# Patient Record
Sex: Female | Born: 1955 | Race: White | Hispanic: No | Smoking: Current every day smoker
Health system: Southern US, Community
[De-identification: ages and names within clinical notes are randomized; demographics above are authoritative.]

## PROBLEM LIST (undated history)

## (undated) DIAGNOSIS — F32A Depression, unspecified: Secondary | ICD-10-CM

## (undated) DIAGNOSIS — E785 Hyperlipidemia, unspecified: Secondary | ICD-10-CM

## (undated) DIAGNOSIS — R112 Nausea with vomiting, unspecified: Secondary | ICD-10-CM

## (undated) DIAGNOSIS — A0472 Enterocolitis due to Clostridium difficile, not specified as recurrent: Secondary | ICD-10-CM

## (undated) DIAGNOSIS — C801 Malignant (primary) neoplasm, unspecified: Secondary | ICD-10-CM

## (undated) DIAGNOSIS — IMO0002 Reserved for concepts with insufficient information to code with codable children: Secondary | ICD-10-CM

## (undated) DIAGNOSIS — F329 Major depressive disorder, single episode, unspecified: Secondary | ICD-10-CM

## (undated) DIAGNOSIS — Z9889 Other specified postprocedural states: Secondary | ICD-10-CM

## (undated) DIAGNOSIS — I2699 Other pulmonary embolism without acute cor pulmonale: Secondary | ICD-10-CM

## (undated) DIAGNOSIS — E038 Other specified hypothyroidism: Secondary | ICD-10-CM

## (undated) HISTORY — PX: LAMINECTOMY: SHX219

## (undated) HISTORY — PX: ABDOMINAL HYSTERECTOMY: SHX81

---

## 2012-08-29 ENCOUNTER — Encounter: Payer: Self-pay | Admitting: Physical Medicine & Rehabilitation

## 2012-10-04 ENCOUNTER — Ambulatory Visit: Payer: Medicare Other | Admitting: Physical Medicine & Rehabilitation

## 2013-06-03 ENCOUNTER — Ambulatory Visit: Payer: Medicare Other | Attending: Internal Medicine

## 2013-06-03 DIAGNOSIS — IMO0001 Reserved for inherently not codable concepts without codable children: Secondary | ICD-10-CM | POA: Insufficient documentation

## 2013-06-03 DIAGNOSIS — M2550 Pain in unspecified joint: Secondary | ICD-10-CM | POA: Insufficient documentation

## 2013-06-10 ENCOUNTER — Ambulatory Visit: Payer: Medicare Other | Admitting: Physical Therapy

## 2013-06-12 ENCOUNTER — Ambulatory Visit: Payer: Medicare Other

## 2013-06-17 ENCOUNTER — Ambulatory Visit: Payer: Medicare Other | Attending: Internal Medicine

## 2013-06-17 DIAGNOSIS — IMO0001 Reserved for inherently not codable concepts without codable children: Secondary | ICD-10-CM | POA: Insufficient documentation

## 2013-06-17 DIAGNOSIS — M25579 Pain in unspecified ankle and joints of unspecified foot: Secondary | ICD-10-CM | POA: Insufficient documentation

## 2013-06-19 ENCOUNTER — Ambulatory Visit: Payer: Medicare Other

## 2013-07-09 ENCOUNTER — Emergency Department (HOSPITAL_COMMUNITY)
Admission: EM | Admit: 2013-07-09 | Discharge: 2013-07-10 | Disposition: A | Discharge status: Home / Self Care | Payer: Medicare Other | Attending: Emergency Medicine | Admitting: Emergency Medicine

## 2013-07-09 ENCOUNTER — Encounter (HOSPITAL_COMMUNITY): Payer: Self-pay | Admitting: Emergency Medicine

## 2013-07-09 DIAGNOSIS — E039 Hypothyroidism, unspecified: Secondary | ICD-10-CM | POA: Insufficient documentation

## 2013-07-09 DIAGNOSIS — Z79899 Other long term (current) drug therapy: Secondary | ICD-10-CM | POA: Insufficient documentation

## 2013-07-09 DIAGNOSIS — F329 Major depressive disorder, single episode, unspecified: Secondary | ICD-10-CM | POA: Insufficient documentation

## 2013-07-09 DIAGNOSIS — Z87828 Personal history of other (healed) physical injury and trauma: Secondary | ICD-10-CM | POA: Insufficient documentation

## 2013-07-09 DIAGNOSIS — E785 Hyperlipidemia, unspecified: Secondary | ICD-10-CM | POA: Insufficient documentation

## 2013-07-09 DIAGNOSIS — M62838 Other muscle spasm: Secondary | ICD-10-CM | POA: Insufficient documentation

## 2013-07-09 DIAGNOSIS — F3289 Other specified depressive episodes: Secondary | ICD-10-CM | POA: Insufficient documentation

## 2013-07-09 DIAGNOSIS — F17200 Nicotine dependence, unspecified, uncomplicated: Secondary | ICD-10-CM | POA: Insufficient documentation

## 2013-07-09 HISTORY — DX: Hyperlipidemia, unspecified: E78.5

## 2013-07-09 HISTORY — DX: Other specified hypothyroidism: E03.8

## 2013-07-09 HISTORY — DX: Major depressive disorder, single episode, unspecified: F32.9

## 2013-07-09 HISTORY — DX: Depression, unspecified: F32.A

## 2013-07-09 HISTORY — DX: Reserved for concepts with insufficient information to code with codable children: IMO0002

## 2013-07-09 LAB — CBC WITH DIFFERENTIAL/PLATELET
BASOS ABS: 0 10*3/uL (ref 0.0–0.1)
BASOS PCT: 0 % (ref 0–1)
EOS ABS: 0.1 10*3/uL (ref 0.0–0.7)
EOS PCT: 1 % (ref 0–5)
HCT: 38 % (ref 36.0–46.0)
Hemoglobin: 12.4 g/dL (ref 12.0–15.0)
Lymphocytes Relative: 30 % (ref 12–46)
Lymphs Abs: 2.4 10*3/uL (ref 0.7–4.0)
MCH: 30.5 pg (ref 26.0–34.0)
MCHC: 32.6 g/dL (ref 30.0–36.0)
MCV: 93.6 fL (ref 78.0–100.0)
Monocytes Absolute: 0.7 10*3/uL (ref 0.1–1.0)
Monocytes Relative: 8 % (ref 3–12)
Neutro Abs: 5.1 10*3/uL (ref 1.7–7.7)
Neutrophils Relative %: 61 % (ref 43–77)
PLATELETS: 296 10*3/uL (ref 150–400)
RBC: 4.06 MIL/uL (ref 3.87–5.11)
RDW: 13.5 % (ref 11.5–15.5)
WBC: 8.3 10*3/uL (ref 4.0–10.5)

## 2013-07-09 LAB — COMPREHENSIVE METABOLIC PANEL
ALBUMIN: 3.5 g/dL (ref 3.5–5.2)
ALK PHOS: 125 U/L — AB (ref 39–117)
ALT: 12 U/L (ref 0–35)
AST: 15 U/L (ref 0–37)
BUN: 13 mg/dL (ref 6–23)
CALCIUM: 10.7 mg/dL — AB (ref 8.4–10.5)
CO2: 24 mEq/L (ref 19–32)
Chloride: 100 mEq/L (ref 96–112)
Creatinine, Ser: 0.6 mg/dL (ref 0.50–1.10)
GFR calc Af Amer: >90 mL/min (ref >90)
GFR calc non Af Amer: >90 mL/min (ref >90)
Glucose, Bld: 99 mg/dL (ref 70–99)
POTASSIUM: 3.8 meq/L (ref 3.7–5.3)
SODIUM: 140 meq/L (ref 137–147)
TOTAL PROTEIN: 7.7 g/dL (ref 6.0–8.3)
Total Bilirubin: 0.2 mg/dL — ABNORMAL LOW (ref 0.3–1.2)

## 2013-07-09 LAB — CK: Total CK: 69 U/L (ref 7–177)

## 2013-07-09 MED ORDER — ONDANSETRON HCL 4 MG/2ML IJ SOLN
4.0000 mg | Freq: Once | INTRAMUSCULAR | Status: AC
  Administered 2013-07-09: 4 mg via INTRAVENOUS
  Filled 2013-07-09: qty 2

## 2013-07-09 MED ORDER — HYDROMORPHONE HCL PF 1 MG/ML IJ SOLN
1.0000 mg | Freq: Once | INTRAMUSCULAR | Status: AC
  Administered 2013-07-09: 1 mg via INTRAVENOUS
  Filled 2013-07-09: qty 1

## 2013-07-09 MED ORDER — DIAZEPAM 5 MG PO TABS
10.0000 mg | ORAL_TABLET | Freq: Once | ORAL | Status: AC
  Administered 2013-07-09: 10 mg via ORAL
  Filled 2013-07-09: qty 2

## 2013-07-09 NOTE — ED Notes (Signed)
Per EMS: Pt reports muscle spasms to R leg that has been going on for 3 months to where pain is getting gradually worse to where she is now unable to ambulate. EMS reports incontinent just d/t not being able to get to bathroom. Pt alert to triage area brought in on stretcher.

## 2013-07-09 NOTE — ED Provider Notes (Signed)
CSN: [id number]     Arrival date & time 07/09/13  1944 History   First MD Initiated Contact with Patient 07/09/13 2224     Chief Complaint  Patient presents with  . Spasms     (Consider location/radiation/quality/duration/timing/severity/associated sxs/prior Treatment) HPI Comments: Patient is a 58 year old female with history of hyperlipidemia, hypothyroidism, spinal cord injury who presents today with right leg pain. She states that for the past 3 months she has had worsening spasming. She has pain in her right upper leg she cannot take her leg out of the flexion position she is tried Vicodin without relief of her symptoms. She is currently on Zanaflex. She has been seen by orthopedics who recommended she be seen by a specialist in [place]. She denies any other symptoms including fever, chills, nausea, vomiting, abdominal pain, diarrhea, cough, cold, congestion, back pain, bowel or bladder incontinence. The pain is worse at night and better during the day. She is able to ambulate during the day, but currently cannot.   The history is provided by the patient. No language interpreter was used.    Past Medical History  Diagnosis Date  . Central hypothyroidism   . Hyperlipidemia   . Spinal cord injury   . Depression    Past Surgical History  Procedure Laterality Date  . Laminectomy    . Abdominal hysterectomy     History reviewed. No pertinent family history. History  Substance Use Topics  . Smoking status: Current Every Day Smoker -- 1.00 packs/day    Types: Cigarettes  . Smokeless tobacco: Not on file  . Alcohol Use: Yes   OB History   Grav Para Term Preterm Abortions TAB SAB Ect Mult Living                 Review of Systems  Constitutional: Negative for fever and chills.  HENT: Negative for congestion, dental problem and sore throat.   Respiratory: Negative for shortness of breath.   Cardiovascular: Negative for chest pain.  Gastrointestinal: Negative for nausea,  vomiting and abdominal pain.  Musculoskeletal: Positive for gait problem and myalgias.  All other systems reviewed and are negative.     Allergies  Morphine and related  Home Medications   Prior to Admission medications   Medication Sig Start Date End Date Taking? Authorizing Provider  acetaminophen (TYLENOL) 500 MG tablet Take 1,000 mg by mouth every 6 (six) hours as needed for moderate pain.   Yes Historical Provider, MD  DULoxetine (CYMBALTA) 60 MG capsule Take 60 mg by mouth at bedtime.   Yes Historical Provider, MD  gabapentin (NEURONTIN) 600 MG tablet Take 1,200 mg by mouth 3 (three) times daily.   Yes Historical Provider, MD  levothyroxine (SYNTHROID, LEVOTHROID) 75 MCG tablet Take 75 mcg by mouth See admin instructions. Takes daily except Monday and Thursday   Yes Historical Provider, MD  rizatriptan (MAXALT) 10 MG tablet Take 10 mg by mouth as needed for migraine. May repeat in 2 hours if needed   Yes Historical Provider, MD  simvastatin (ZOCOR) 40 MG tablet Take 40 mg by mouth daily.   Yes Historical Provider, MD  tiZANidine (ZANAFLEX) 4 MG tablet Take 8 mg by mouth 3 (three) times daily.   Yes Historical Provider, MD  topiramate (TOPAMAX) 100 MG tablet Take 100 mg by mouth at bedtime.   Yes Historical Provider, MD   BP 143/74  Pulse 79  Temp(Src) 97.7 F (36.5 C) (Oral)  Resp 16  Ht 5\' 8"  (1.727 m)  Wt 150 lb (68.04 kg)  BMI 22.81 kg/m2  SpO2 97% Physical Exam  Nursing note and vitals reviewed. Constitutional: She is oriented to person, place, and time. She appears well-developed and well-nourished. No distress.  HENT:  Head: Normocephalic and atraumatic.  Right Ear: External ear normal.  Left Ear: External ear normal.  Nose: Nose normal.  Mouth/Throat: Oropharynx is clear and moist.  Eyes: Conjunctivae are normal.  Neck: Normal range of motion.  Cardiovascular: Normal rate, regular rhythm, normal heart sounds, intact distal pulses and normal pulses.   Pulses:       Radial pulses are 2+ on the right side, and 2+ on the left side.       Dorsalis pedis pulses are 2+ on the right side, and 2+ on the left side.       Posterior tibial pulses are 2+ on the right side, and 2+ on the left side.  Pulmonary/Chest: Effort normal and breath sounds normal. No stridor. No respiratory distress. She has no wheezes. She has no rales.  Abdominal: Soft. She exhibits no distension.  Musculoskeletal: Normal range of motion.  Right knee in flexion position. Sensation intact. Compartment soft. Patient loudly screams when PROM is tried to extend knee.  Her proximal right leg is minimally tender to palpation, but most pain comes from extension.  Patient is able to wiggle toes. Dorsi/plantar flexion intact.   Neurological: She is alert and oriented to person, place, and time. She has normal strength.  Skin: Skin is warm and dry. She is not diaphoretic. No erythema.  Psychiatric: She has a normal mood and affect. Her behavior is normal.    ED Course  Procedures (including critical care time) Labs Review Labs Reviewed  COMPREHENSIVE METABOLIC PANEL - Abnormal; Notable for the following:    Calcium 10.7 (*)    Alkaline Phosphatase 125 (*)    Total Bilirubin 0.2 (*)    All other components within normal limits  CBC WITH DIFFERENTIAL  CK    Imaging Review No results found.   EKG Interpretation None      MDM   Final diagnoses:  Leg muscle spasm   Patient with muscle spasm in right leg. Neurovascularly intact, compartment soft, sensation intact. This has been a gradually worsening problem over 3 months. Worse at night, better in the day. Patient feels improved after Dilaudid and valium, but still cannot walk. She was given rx for percocet and valium and instructed to follow up with the specialist at [facility] she is scheduled to see. Discussed reasons to return to ED immediately. Vital signs stable for discharge. Discussed case with Dr. Dina Rich who agrees with plan.  Patient / Family / Caregiver informed of clinical course, understand medical decision-making process, and agree with plan.  Elwyn Lade, PA-C 07/10/13 1214

## 2013-07-10 MED ORDER — HYDROMORPHONE HCL PF 2 MG/ML IJ SOLN
2.0000 mg | Freq: Once | INTRAMUSCULAR | Status: AC
  Administered 2013-07-10: 2 mg via INTRAVENOUS
  Filled 2013-07-10: qty 1

## 2013-07-10 MED ORDER — DIAZEPAM 10 MG PO TABS: 10.0000 mg | ORAL_TABLET | Freq: Four times a day (QID) | ORAL | Status: DC | PRN

## 2013-07-10 MED ORDER — OXYCODONE-ACETAMINOPHEN 5-325 MG PO TABS: 1.0000 | ORAL_TABLET | Freq: Four times a day (QID) | ORAL | Status: DC | PRN

## 2013-07-10 NOTE — Progress Notes (Signed)
  CARE MANAGEMENT ED NOTE 07/10/2013  Patient:  Kristie Jackson, Kristie Jackson   Account Number:  [id number]  Date Initiated:  07/10/2013  Documentation initiated by:  Jackelyn Poling  Subjective/Objective Assessment:   58 yr old [employer] [place] resident seen on 07/09/13 in [place] ED for muscle spasms to RLE that has been going on for 3 months to where pain is getting gradually worse to where she is now unable to ambulate. EMS reports incontinence     Subjective/Objective Assessment Detail:   d/c with Rx DTR Kristie Jackson 1 [phone] Requests sw for pt in home and requests CM call pcp office to voice her concern of need of SW (states she has already done so without return call)  pcp Kristie Jackson     Action/Plan:   [facility] ED CM received a referral from ED SW after pt's daughter Kristie Jackson inquired if any further interventions could be offered to pt CM reviewed EPIC notes CM called daughter (Dtr) CM answered her question about having a SW to see pt in her home   Action/Plan Detail:   Discussed possible available SW via Home health Kristie Jackson) or Kristie Jackson CM provided Kristie Jackson with Kristie Jackson contact number and pcp contact number [phone] CM left Dr Kristie Jackson CMA, Kristie Jackson, a voice message informing her of dtr requests for [facility]   Anticipated DC Date:  07/10/2013     Status Recommendation to Physician:   Result of Recommendation:    Other ED Services  Consult Working Plan   In-house referral  Clinical Social Worker   DC Forensic scientist  Other  Outpatient Services - Pt will follow up  PCP issues    Choice offered to / List presented to:            Status of service:  Completed, signed off  ED Comments:   ED Comments Detail:  Left Pt's & Cm contact information for Kristie Jackson Pending response to call

## 2013-07-10 NOTE — Discharge Instructions (Signed)
°  Muscle Cramps and Spasms °Muscle cramps and spasms occur when a muscle or muscles tighten and you have no control over this tightening (involuntary muscle contraction). They are a common problem and can develop in any muscle. The most common place is in the calf muscles of the leg. Both muscle cramps and muscle spasms are involuntary muscle contractions, but they also have differences:  °· Muscle cramps are sporadic and painful. They may last a few seconds to a quarter of an hour. Muscle cramps are often more forceful and last longer than muscle spasms. °· Muscle spasms may or may not be painful. They may also last just a few seconds or much longer. °CAUSES  °It is uncommon for cramps or spasms to be due to a serious underlying problem. In many cases, the cause of cramps or spasms is unknown. Some common causes are:  °· Overexertion.   °· Overuse from repetitive motions (doing the same thing over and over).   °· Remaining in a certain position for a long period of time.   °· Improper preparation, form, or technique while performing a sport or activity.   °· Dehydration.   °· Injury.   °· Side effects of some medicines.   °· Abnormally low levels of the salts and ions in your blood (electrolytes), especially potassium and calcium. This could happen if you are taking water pills (diuretics) or you are pregnant.   °Some underlying medical problems can make it more likely to develop cramps or spasms. These include, but are not limited to:  °· Diabetes.   °· Parkinson disease.   °· Hormone disorders, such as thyroid problems.   °· Alcohol abuse.   °· Diseases specific to muscles, joints, and bones.   °· Blood vessel disease where not enough blood is getting to the muscles.   °HOME CARE INSTRUCTIONS  °· Stay well hydrated. Drink enough water and fluids to keep your urine clear or pale yellow. °· It may be helpful to massage, stretch, and relax the affected muscle. °· For tight or tense muscles, use a warm towel, heating  pad, or hot shower water directed to the affected area. °· If you are sore or have pain after a cramp or spasm, applying ice to the affected area may relieve discomfort. °· Put ice in a plastic bag. °· Place a towel between your skin and the bag. °· Leave the ice on for 15-20 minutes, 03-04 times a day. °· Medicines used to treat a known cause of cramps or spasms may help reduce their frequency or severity. Only take over-the-counter or prescription medicines as directed by your caregiver. °SEEK MEDICAL CARE IF:  °Your cramps or spasms get more severe, more frequent, or do not improve over time.  °MAKE SURE YOU:  °· Understand these instructions. °· Will watch your condition. °· Will get help right away if you are not doing well or get worse. °Document Released: 07/22/2001 Document Revised: 05/27/2012 Document Reviewed: 01/17/2012 °[employer]® Patient Information ©2014 [employer]. ° ° °

## 2013-07-11 NOTE — ED Provider Notes (Signed)
Medical screening examination/treatment/procedure(s) were performed by non-physician practitioner and as supervising physician I was immediately available for consultation/collaboration.   EKG Interpretation None       Merryl Hacker, MD 07/11/13 2045

## 2013-08-13 DIAGNOSIS — I2699 Other pulmonary embolism without acute cor pulmonale: Secondary | ICD-10-CM

## 2013-08-13 HISTORY — DX: Other pulmonary embolism without acute cor pulmonale: I26.99

## 2013-08-27 ENCOUNTER — Emergency Department (HOSPITAL_COMMUNITY): Payer: Medicare Other

## 2013-08-27 ENCOUNTER — Encounter (HOSPITAL_COMMUNITY): Payer: Self-pay | Admitting: Emergency Medicine

## 2013-08-27 ENCOUNTER — Inpatient Hospital Stay (HOSPITAL_COMMUNITY)
Admission: EM | Admit: 2013-08-27 | Discharge: 2013-09-05 | DRG: 853 | Disposition: A | Discharge status: Home Health | Payer: Medicare Other | Attending: Internal Medicine | Admitting: Internal Medicine

## 2013-08-27 DIAGNOSIS — F329 Major depressive disorder, single episode, unspecified: Secondary | ICD-10-CM | POA: Diagnosis present

## 2013-08-27 DIAGNOSIS — Z8041 Family history of malignant neoplasm of ovary: Secondary | ICD-10-CM

## 2013-08-27 DIAGNOSIS — R29898 Other symptoms and signs involving the musculoskeletal system: Secondary | ICD-10-CM | POA: Diagnosis present

## 2013-08-27 DIAGNOSIS — F1210 Cannabis abuse, uncomplicated: Secondary | ICD-10-CM | POA: Diagnosis present

## 2013-08-27 DIAGNOSIS — C50919 Malignant neoplasm of unspecified site of unspecified female breast: Secondary | ICD-10-CM | POA: Diagnosis present

## 2013-08-27 DIAGNOSIS — F1910 Other psychoactive substance abuse, uncomplicated: Secondary | ICD-10-CM

## 2013-08-27 DIAGNOSIS — Z170 Estrogen receptor positive status [ER+]: Secondary | ICD-10-CM

## 2013-08-27 DIAGNOSIS — K5900 Constipation, unspecified: Secondary | ICD-10-CM | POA: Diagnosis present

## 2013-08-27 DIAGNOSIS — C7800 Secondary malignant neoplasm of unspecified lung: Secondary | ICD-10-CM | POA: Diagnosis present

## 2013-08-27 DIAGNOSIS — I2699 Other pulmonary embolism without acute cor pulmonale: Secondary | ICD-10-CM | POA: Diagnosis present

## 2013-08-27 DIAGNOSIS — J4489 Other specified chronic obstructive pulmonary disease: Secondary | ICD-10-CM | POA: Diagnosis present

## 2013-08-27 DIAGNOSIS — F17200 Nicotine dependence, unspecified, uncomplicated: Secondary | ICD-10-CM | POA: Diagnosis present

## 2013-08-27 DIAGNOSIS — E43 Unspecified severe protein-calorie malnutrition: Secondary | ICD-10-CM | POA: Diagnosis present

## 2013-08-27 DIAGNOSIS — D1803 Hemangioma of intra-abdominal structures: Secondary | ICD-10-CM | POA: Diagnosis present

## 2013-08-27 DIAGNOSIS — M48061 Spinal stenosis, lumbar region without neurogenic claudication: Secondary | ICD-10-CM | POA: Diagnosis present

## 2013-08-27 DIAGNOSIS — A419 Sepsis, unspecified organism: Secondary | ICD-10-CM | POA: Diagnosis not present

## 2013-08-27 DIAGNOSIS — C801 Malignant (primary) neoplasm, unspecified: Secondary | ICD-10-CM

## 2013-08-27 DIAGNOSIS — C7952 Secondary malignant neoplasm of bone marrow: Secondary | ICD-10-CM

## 2013-08-27 DIAGNOSIS — F3289 Other specified depressive episodes: Secondary | ICD-10-CM | POA: Diagnosis present

## 2013-08-27 DIAGNOSIS — C7951 Secondary malignant neoplasm of bone: Secondary | ICD-10-CM | POA: Diagnosis present

## 2013-08-27 DIAGNOSIS — G992 Myelopathy in diseases classified elsewhere: Secondary | ICD-10-CM | POA: Diagnosis present

## 2013-08-27 DIAGNOSIS — J449 Chronic obstructive pulmonary disease, unspecified: Secondary | ICD-10-CM | POA: Diagnosis present

## 2013-08-27 DIAGNOSIS — R748 Abnormal levels of other serum enzymes: Secondary | ICD-10-CM | POA: Diagnosis present

## 2013-08-27 DIAGNOSIS — E8809 Other disorders of plasma-protein metabolism, not elsewhere classified: Secondary | ICD-10-CM | POA: Diagnosis present

## 2013-08-27 DIAGNOSIS — M8448XA Pathological fracture, other site, initial encounter for fracture: Secondary | ICD-10-CM | POA: Diagnosis present

## 2013-08-27 DIAGNOSIS — C787 Secondary malignant neoplasm of liver and intrahepatic bile duct: Secondary | ICD-10-CM | POA: Diagnosis present

## 2013-08-27 DIAGNOSIS — S32019A Unspecified fracture of first lumbar vertebra, initial encounter for closed fracture: Secondary | ICD-10-CM

## 2013-08-27 DIAGNOSIS — S32039A Unspecified fracture of third lumbar vertebra, initial encounter for closed fracture: Secondary | ICD-10-CM

## 2013-08-27 DIAGNOSIS — C773 Secondary and unspecified malignant neoplasm of axilla and upper limb lymph nodes: Secondary | ICD-10-CM | POA: Diagnosis present

## 2013-08-27 DIAGNOSIS — D638 Anemia in other chronic diseases classified elsewhere: Secondary | ICD-10-CM | POA: Diagnosis present

## 2013-08-27 DIAGNOSIS — Z87828 Personal history of other (healed) physical injury and trauma: Secondary | ICD-10-CM

## 2013-08-27 DIAGNOSIS — IMO0002 Reserved for concepts with insufficient information to code with codable children: Secondary | ICD-10-CM

## 2013-08-27 DIAGNOSIS — Z79899 Other long term (current) drug therapy: Secondary | ICD-10-CM

## 2013-08-27 DIAGNOSIS — I959 Hypotension, unspecified: Secondary | ICD-10-CM

## 2013-08-27 DIAGNOSIS — G9340 Encephalopathy, unspecified: Secondary | ICD-10-CM

## 2013-08-27 DIAGNOSIS — R509 Fever, unspecified: Secondary | ICD-10-CM | POA: Diagnosis not present

## 2013-08-27 DIAGNOSIS — E785 Hyperlipidemia, unspecified: Secondary | ICD-10-CM | POA: Diagnosis present

## 2013-08-27 DIAGNOSIS — G9341 Metabolic encephalopathy: Secondary | ICD-10-CM | POA: Diagnosis present

## 2013-08-27 DIAGNOSIS — E038 Other specified hypothyroidism: Secondary | ICD-10-CM | POA: Diagnosis present

## 2013-08-27 DIAGNOSIS — G8220 Paraplegia, unspecified: Secondary | ICD-10-CM | POA: Diagnosis present

## 2013-08-27 HISTORY — DX: Malignant (primary) neoplasm, unspecified: C80.1

## 2013-08-27 LAB — URINALYSIS, ROUTINE W REFLEX MICROSCOPIC
Bilirubin Urine: NEGATIVE
Glucose, UA: NEGATIVE mg/dL
Hgb urine dipstick: NEGATIVE
KETONES UR: NEGATIVE mg/dL
LEUKOCYTES UA: NEGATIVE
Nitrite: NEGATIVE
PH: 6 (ref 5.0–8.0)
Protein, ur: NEGATIVE mg/dL
Specific Gravity, Urine: 1.019 (ref 1.005–1.030)
Urobilinogen, UA: 0.2 mg/dL (ref 0.0–1.0)

## 2013-08-27 LAB — CBC WITH DIFFERENTIAL/PLATELET
BASOS PCT: 0 % (ref 0–1)
Basophils Absolute: 0 10*3/uL (ref 0.0–0.1)
EOS ABS: 0.1 10*3/uL (ref 0.0–0.7)
Eosinophils Relative: 1 % (ref 0–5)
HEMATOCRIT: 35.7 % — AB (ref 36.0–46.0)
Hemoglobin: 11.4 g/dL — ABNORMAL LOW (ref 12.0–15.0)
LYMPHS ABS: 4.7 10*3/uL — AB (ref 0.7–4.0)
Lymphocytes Relative: 40 % (ref 12–46)
MCH: 29.6 pg (ref 26.0–34.0)
MCHC: 31.9 g/dL (ref 30.0–36.0)
MCV: 92.7 fL (ref 78.0–100.0)
MONO ABS: 1.2 10*3/uL — AB (ref 0.1–1.0)
Monocytes Relative: 10 % (ref 3–12)
NEUTROS PCT: 49 % (ref 43–77)
Neutro Abs: 5.7 10*3/uL (ref 1.7–7.7)
Platelets: 401 10*3/uL — ABNORMAL HIGH (ref 150–400)
RBC: 3.85 MIL/uL — AB (ref 3.87–5.11)
RDW: 14.1 % (ref 11.5–15.5)
WBC: 11.6 10*3/uL — ABNORMAL HIGH (ref 4.0–10.5)

## 2013-08-27 LAB — COMPREHENSIVE METABOLIC PANEL
ALT: 18 U/L (ref 0–35)
AST: 21 U/L (ref 0–37)
Albumin: 2.7 g/dL — ABNORMAL LOW (ref 3.5–5.2)
Alkaline Phosphatase: 135 U/L — ABNORMAL HIGH (ref 39–117)
Anion gap: 15 (ref 5–15)
BILIRUBIN TOTAL: 0.2 mg/dL — AB (ref 0.3–1.2)
BUN: 16 mg/dL (ref 6–23)
CHLORIDE: 98 meq/L (ref 96–112)
CO2: 26 meq/L (ref 19–32)
Calcium: 11.5 mg/dL — ABNORMAL HIGH (ref 8.4–10.5)
Creatinine, Ser: 0.65 mg/dL (ref 0.50–1.10)
GFR calc Af Amer: >90 mL/min (ref >90)
GFR calc non Af Amer: >90 mL/min (ref >90)
Glucose, Bld: 100 mg/dL — ABNORMAL HIGH (ref 70–99)
Potassium: 4.2 mEq/L (ref 3.7–5.3)
SODIUM: 139 meq/L (ref 137–147)
Total Protein: 7 g/dL (ref 6.0–8.3)

## 2013-08-27 LAB — LIPASE, BLOOD: Lipase: 10 U/L — ABNORMAL LOW (ref 11–59)

## 2013-08-27 LAB — ETHANOL

## 2013-08-27 LAB — I-STAT CG4 LACTIC ACID, ED: Lactic Acid, Venous: 0.73 mmol/L (ref 0.5–2.2)

## 2013-08-27 MED ORDER — PIPERACILLIN-TAZOBACTAM 3.375 G IVPB 30 MIN
3.3750 g | INTRAVENOUS | Status: AC
  Administered 2013-08-27: 3.375 g via INTRAVENOUS
  Filled 2013-08-27: qty 50

## 2013-08-27 MED ORDER — ACETAMINOPHEN 325 MG PO TABS
650.0000 mg | ORAL_TABLET | Freq: Four times a day (QID) | ORAL | Status: DC | PRN
  Administered 2013-08-27: 650 mg via ORAL
  Filled 2013-08-27 (×2): qty 2

## 2013-08-27 MED ORDER — VANCOMYCIN HCL IN DEXTROSE 1-5 GM/200ML-% IV SOLN
1000.0000 mg | INTRAVENOUS | Status: AC
  Administered 2013-08-27: 1000 mg via INTRAVENOUS
  Filled 2013-08-27: qty 200

## 2013-08-27 MED ORDER — SODIUM CHLORIDE 0.9 % IV BOLUS (SEPSIS)
30.0000 mL/kg | Freq: Once | INTRAVENOUS | Status: AC
  Administered 2013-08-27: 2040 mL via INTRAVENOUS

## 2013-08-27 MED ORDER — SODIUM CHLORIDE 0.9 % IV SOLN
1000.0000 mL | INTRAVENOUS | Status: DC
  Administered 2013-08-27 – 2013-09-01 (×7): 1000 mL via INTRAVENOUS

## 2013-08-27 NOTE — ED Provider Notes (Signed)
CSN: [id number]     Arrival date & time 08/27/13  2019 History   First MD Initiated Contact with Patient 08/27/13 2021     Chief Complaint  Patient presents with  . Fever     (Consider location/radiation/quality/duration/timing/severity/associated sxs/prior Treatment) The history is provided by the patient. No language interpreter was used.  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 Patient reported that her CNA at home reported that she felt warm and stated that she had a fever of 101.61F. Patient reported that she has been having intermittent sweating all day today. Reported that she has not had an appetite, but stated that she has not had an appetite for approximately 3 months - stating that she has not been hydrating herself. Patient reported that she is incontinent. Reported that she finally found a neurologist - stated that she is being followed by Dr. Lora Havens at [facility] - stated that she was recently diagnosed with possible multiple myeloma - stated that she is due to get a MRI of her brain. Denied chest pain, shortness of breath, difficulty breathing, changes appetite, nausea, vomiting, diarrhea, melena, hematochezia, hematuria, abdominal pain.  PCP Dr. Maxwell Caul  Past Medical History  Diagnosis Date  . Central hypothyroidism   . Hyperlipidemia   . Spinal cord injury   . Depression   . Cancer     spinal metastasis   Past Surgical History  Procedure Laterality Date  . Laminectomy    . Abdominal hysterectomy     No family history on file. History  Substance Use Topics  . Smoking status: Current Every Day Smoker -- 1.00 packs/day    Types: Cigarettes  . Smokeless tobacco: Not on file  . Alcohol Use: Yes   OB History   Grav Para Term Preterm Abortions TAB SAB Ect Mult Living                 Review of Systems  Constitutional: Positive for fever and  chills.  Respiratory: Negative for chest tightness and shortness of breath.   Cardiovascular: Negative for chest pain.  Gastrointestinal: Negative for nausea, vomiting, abdominal pain, diarrhea, constipation and blood in stool.  Genitourinary: Negative for hematuria.  Neurological: Negative for weakness and headaches.      Allergies  Morphine and related  Home Medications   Prior to Admission medications   Medication Sig Start Date End Date Taking? Authorizing Provider  acetaminophen (TYLENOL) 500 MG tablet Take 1,000 mg by mouth every 6 (six) hours as needed for moderate pain.   Yes Historical Provider, MD  diazepam (VALIUM) 10 MG tablet Take 10 mg by mouth every 6 (six) hours as needed. For muscle spasms 07/10/13  Yes Elwyn Lade, PA-C  DULoxetine (CYMBALTA) 60 MG capsule Take 60 mg by mouth at bedtime.   Yes Historical Provider, MD  gabapentin (NEURONTIN) 600 MG tablet Take 1,200 mg by mouth 3 (three) times daily.   Yes Historical Provider, MD  levothyroxine (SYNTHROID, LEVOTHROID) 75 MCG tablet Take 75 mcg by mouth See admin instructions. Takes daily except Monday and Thursday   Yes Historical Provider, MD  oxyCODONE-acetaminophen (PERCOCET/ROXICET) 5-325 MG per tablet Take 1-2 tablets by mouth every 6 (six) hours as needed for severe pain. 07/10/13  Yes Elwyn Lade, PA-C  rizatriptan (MAXALT) 10 MG tablet Take 10 mg by mouth as needed for migraine. May repeat in 2 hours if needed  Yes Historical Provider, MD  simvastatin (ZOCOR) 40 MG tablet Take 40 mg by mouth daily.   Yes Historical Provider, MD  tiZANidine (ZANAFLEX) 4 MG tablet Take 8 mg by mouth 3 (three) times daily.   Yes Historical Provider, MD  topiramate (TOPAMAX) 100 MG tablet Take 100 mg by mouth at bedtime.   Yes Historical Provider, MD   BP 106/69  Pulse 84  Temp(Src) 98.5 F (36.9 C) (Oral)  Resp 15  Ht 5' 8"  (1.727 m)  Wt 150 lb (68.04 kg)  BMI 22.81 kg/m2  SpO2 100% Physical Exam  Nursing note and  vitals reviewed. Constitutional: She is oriented to person, place, and time. She appears well-developed and well-nourished. No distress.  HENT:  Head: Normocephalic and atraumatic.  Mouth/Throat: No oropharyngeal exudate.  Dry mucus membranes  Eyes: Conjunctivae and EOM are normal. Pupils are equal, round, and reactive to light. Right eye exhibits no discharge. Left eye exhibits no discharge.  Neck: Normal range of motion. Neck supple. No tracheal deviation present.  Negative neck stiffness Negative nuchal rigidity  Negative cervical lymphadenopathy  Negative meningeal signs   Cardiovascular: Normal rate, regular rhythm and normal heart sounds.  Exam reveals no friction rub.   No murmur heard. Cap refill < 3 seconds Negative swelling or pitting edema noted to the lower extremities bilaterally   Pulmonary/Chest: Effort normal and breath sounds normal. No respiratory distress. She has no wheezes. She has no rales.  Patient is able to speak in full sentences without difficulty  Negative use of accessory muscles Negative stridor  Abdominal: Soft. Bowel sounds are normal. She exhibits no distension. There is no tenderness. There is no rebound and no guarding.  Negative abdominal distension noted BS normoactive in all 4 quadrants Negative pain upon palpation to the abdomen  Abdomen soft upon palpation  Negative peritoneal signs Negative rigidity or guarding noted  Musculoskeletal: Normal range of motion.  Paralyzed to the lower extremities bilaterally  Full ROM to the upper extremities bilaterally without difficulty or ataxia  Lymphadenopathy:    She has no cervical adenopathy.  Neurological: She is alert and oriented to person, place, and time. No cranial nerve deficit. She exhibits normal muscle tone. Coordination normal.  Cranial nerves III-XII grossly intact Strength 5+/5+ to upper extremities bilaterally with resistance applied, equal distribution noted Equal grip strength   Negative facial drooping Negative slurred speech  Negative aphasia  GCS 15   Skin: Skin is warm and dry. No rash noted. She is not diaphoretic. No erythema.  Psychiatric: She has a normal mood and affect. Her behavior is normal. Thought content normal.    ED Course  Procedures (including critical care time)  10:57 PM Attending physician at bedside, Dr. Towanda Malkin, assessing patient.   11:28 PM This provider spoke with Dr. Ernestina Patches from [place] - discussed case. Recommended for Critical Care to evaluate.   11:36 PM This provider spoke with Dr. Burt Ek from [place] - recommended that patient be seen by [facility] before calling Critical Care. Critical Care physician to call [facility].   EXAM:  Unenhanced and enhanced Lumbar MRI INDICATION:  Low back and right lower extremity pain. This patient has a history of cervical cord injury.. Technique: Axial and sagittal images of the lumbar spine were obtained using multiple pulse sequences. Contrast Dose:   14 mL MultiHance. COMPARISON: None. FINDINGS: Segmentation: Presumed normal Conus medullaris: No enlargement, masses or signal abnormality. Vertebral Column: Multifocal signal abnormalities are present, including abnormal foci in  the T10 vertebral body, the left T11 vertebral body extending into the left posterior elements, the T12 and L1 vertebral bodies, the latter with mild anterior compression. There is a more extensive signal abnormality in the L3 vertebral body, with expansion of the vertebral body posterolaterally into the left canal, and with a moderate compression deformity. Abnormal signal extends into the posterior elements. There is abnormal signal in the upper L4 vertebral body and in the anterior O superior L5 vertebral body. All of these lesions enhance. At the L3 level there is mild AP canal stenosis, greater on the left, with mild left lateral recess stenosis. Hypodense lesions are  present in the ilia bilaterally. No disc herniations identified. IMPRESSION: Markedly abnormal examination with multiple bony lesions suspicious for metastatic disease. There is moderate compression of the L3 vertebral body with retropulsion posterolaterally to the left, and minimal compression of the L1 vertebral body. A broader skeletal survey may facilitate establishment of a tissue diagnosis. A bone scan and radiographic correlation should be considered. Electronically Signed by:  Charmaine Downs, MD Electronically Signed on:  08/15/2013 4:49 PM  Results for orders placed during the hospital encounter of 08/27/13  CBC WITH DIFFERENTIAL      Result Value Ref Range   WBC 11.6 (*) 4.0 - 10.5 K/uL   RBC 3.85 (*) 3.87 - 5.11 MIL/uL   Hemoglobin 11.4 (*) 12.0 - 15.0 g/dL   HCT 35.7 (*) 36.0 - 46.0 %   MCV 92.7  78.0 - 100.0 fL   MCH 29.6  26.0 - 34.0 pg   MCHC 31.9  30.0 - 36.0 g/dL   RDW 14.1  11.5 - 15.5 %   Platelets 401 (*) 150 - 400 K/uL   Neutrophils Relative % 49  43 - 77 %   Neutro Abs 5.7  1.7 - 7.7 K/uL   Lymphocytes Relative 40  12 - 46 %   Lymphs Abs 4.7 (*) 0.7 - 4.0 K/uL   Monocytes Relative 10  3 - 12 %   Monocytes Absolute 1.2 (*) 0.1 - 1.0 K/uL   Eosinophils Relative 1  0 - 5 %   Eosinophils Absolute 0.1  0.0 - 0.7 K/uL   Basophils Relative 0  0 - 1 %   Basophils Absolute 0.0  0.0 - 0.1 K/uL  LIPASE, BLOOD      Result Value Ref Range   Lipase 10 (*) 11 - 59 U/L  URINALYSIS, ROUTINE W REFLEX MICROSCOPIC      Result Value Ref Range   Color, Urine YELLOW  YELLOW   APPearance HAZY (*) CLEAR   Specific Gravity, Urine 1.019  1.005 - 1.030   pH 6.0  5.0 - 8.0   Glucose, UA NEGATIVE  NEGATIVE mg/dL   Hgb urine dipstick NEGATIVE  NEGATIVE   Bilirubin Urine NEGATIVE  NEGATIVE   Ketones, ur NEGATIVE  NEGATIVE mg/dL   Protein, ur NEGATIVE  NEGATIVE mg/dL   Urobilinogen, UA 0.2  0.0 - 1.0 mg/dL   Nitrite NEGATIVE  NEGATIVE   Leukocytes, UA NEGATIVE  NEGATIVE   COMPREHENSIVE METABOLIC PANEL      Result Value Ref Range   Sodium 139  137 - 147 mEq/L   Potassium 4.2  3.7 - 5.3 mEq/L   Chloride 98  96 - 112 mEq/L   CO2 26  19 - 32 mEq/L   Glucose, Bld 100 (*) 70 - 99 mg/dL   BUN 16  6 - 23 mg/dL   Creatinine, Ser 0.65  0.50 -  1.10 mg/dL   Calcium 11.5 (*) 8.4 - 10.5 mg/dL   Total Protein 7.0  6.0 - 8.3 g/dL   Albumin 2.7 (*) 3.5 - 5.2 g/dL   AST 21  0 - 37 U/L   ALT 18  0 - 35 U/L   Alkaline Phosphatase 135 (*) 39 - 117 U/L   Total Bilirubin 0.2 (*) 0.3 - 1.2 mg/dL   GFR calc non Af Amer >90  >90 mL/min   GFR calc Af Amer >90  >90 mL/min   Anion gap 15  5 - 15  ETHANOL      Result Value Ref Range   Alcohol, Ethyl (B) <11  0 - 11 mg/dL  I-STAT CG4 LACTIC ACID, ED      Result Value Ref Range   Lactic Acid, Venous 0.73  0.5 - 2.2 mmol/L    Labs Review Labs Reviewed  CBC WITH DIFFERENTIAL - Abnormal; Notable for the following:    WBC 11.6 (*)    RBC 3.85 (*)    Hemoglobin 11.4 (*)    HCT 35.7 (*)    Platelets 401 (*)    Lymphs Abs 4.7 (*)    Monocytes Absolute 1.2 (*)    All other components within normal limits  LIPASE, BLOOD - Abnormal; Notable for the following:    Lipase 10 (*)    All other components within normal limits  URINALYSIS, ROUTINE W REFLEX MICROSCOPIC - Abnormal; Notable for the following:    APPearance HAZY (*)    All other components within normal limits  COMPREHENSIVE METABOLIC PANEL - Abnormal; Notable for the following:    Glucose, Bld 100 (*)    Calcium 11.5 (*)    Albumin 2.7 (*)    Alkaline Phosphatase 135 (*)    Total Bilirubin 0.2 (*)    All other components within normal limits  CULTURE, BLOOD (ROUTINE X 2)  CULTURE, BLOOD (ROUTINE X 2)  URINE CULTURE  CULTURE, EXPECTORATED SPUTUM-ASSESSMENT  ETHANOL  URINE RAPID DRUG SCREEN (HOSP PERFORMED)  CBC  CREATININE, SERUM  COMPREHENSIVE METABOLIC PANEL  CBC WITH DIFFERENTIAL  I-STAT CG4 LACTIC ACID, ED    Imaging Review Dg Chest Port 1  View  08/27/2013   CLINICAL DATA:  Fever.  Recent diagnosis of cancer  EXAM: PORTABLE CHEST - 1 VIEW  COMPARISON:  None.  FINDINGS: Small streaky opacities in the lower lungs. No definitive pneumonia. No edema, effusion, or pneumothorax. There is pulmonary hyperinflation suggesting COPD. Normal heart size and upper mediastinal contours when accounting for distortion from rightward rotation.  IMPRESSION: COPD and mild basilar atelectasis. No definitive pneumonia to explain fever.   Electronically Signed   By: Jorje Guild M.D.   On: 08/27/2013 21:49     EKG Interpretation None      MDM   Final diagnoses:  Sepsis, due to unspecified organism  Hypotension, unspecified hypotension type  Cancer    Medications  acetaminophen (TYLENOL) tablet 650 mg (650 mg Oral Given 08/27/13 2042)  sodium chloride 0.9 % bolus 2,040 mL (0 mLs Intravenous Stopped 08/27/13 2338)    Followed by  0.9 %  sodium chloride infusion (1,000 mLs Intravenous New Bag/Given 08/27/13 2218)  vancomycin (VANCOCIN) IVPB 1000 mg/200 mL premix (1,000 mg Intravenous New Bag/Given 08/27/13 2338)  heparin injection 5,000 Units (not administered)  0.9 %  sodium chloride infusion (not administered)  DULoxetine (CYMBALTA) DR capsule 60 mg (not administered)  gabapentin (NEURONTIN) tablet 1,200 mg (not administered)  levothyroxine (SYNTHROID, LEVOTHROID) tablet 75  mcg (not administered)  simvastatin (ZOCOR) tablet 40 mg (not administered)  topiramate (TOPAMAX) tablet 100 mg (not administered)  piperacillin-tazobactam (ZOSYN) IVPB 3.375 g (3.375 g Intravenous New Bag/Given 08/27/13 2337)   Filed Vitals:   08/27/13 2315 08/27/13 2330 08/27/13 2345 08/27/13 2359  BP: 101/55 97/61 106/69 106/69  Pulse: 85 87 88 84  Temp:      TempSrc:      Resp: 13 21 14 15   Height:      Weight:      SpO2: 89% 91% 98% 100%   CBC mildly elevated white blood cell count of 11.6 with negative left shift or leukocytosis noted. Hemoglobin 11.4,  hematocrit 35.7. CMP noted elevated calcium 11.5. BUN and creatinine negative elevation-BUN 16, creatinine 0.65. Electrolytes properly balance. Negative elevated AST, ALT. Elevated alkaline phosphatase of 135-20 due to previous labs alkaline phosphatase appears to be elevated at 125. Lactic acid negative elevation-0.73. Lipase negative elevation. Urinalysis negative for nitrites, leukocytes, hemoglobin. Blood cultures pending. Chest x-ray identified COPD and mild basilar atelectasis-no definitive pneumonia to explain fever.-Small streaky opacities in the lower lungs noted with no definitive findings for pneumonia. Fever of unknown etiology. While in the ED setting patient started to become hypotensive - patient was started on IV fluids with  Large IV bors placed. Patient started IV antibiotics broad spectrum. Patient to be admitted to [facility] under the care of [facility]. Patient agreed to plan of admission. Patient stable for transfer.   Rabon Scholle, PA-C 08/28/13 0300

## 2013-08-27 NOTE — ED Notes (Signed)
Per EMS pt from home, caregiver noticed pt was febrile today. Temperature at home was 101.5. Called PCP and told to come to ER. 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 Pt denies new pain, SOB, dizziness, lightheadedness, n/v/d.

## 2013-08-27 NOTE — ED Notes (Signed)
Phlebotomy at bedside.

## 2013-08-28 ENCOUNTER — Inpatient Hospital Stay (HOSPITAL_COMMUNITY): Payer: Medicare Other

## 2013-08-28 ENCOUNTER — Encounter (HOSPITAL_COMMUNITY): Payer: Self-pay | Admitting: Radiology

## 2013-08-28 DIAGNOSIS — C7800 Secondary malignant neoplasm of unspecified lung: Secondary | ICD-10-CM | POA: Diagnosis present

## 2013-08-28 DIAGNOSIS — E8809 Other disorders of plasma-protein metabolism, not elsewhere classified: Secondary | ICD-10-CM | POA: Diagnosis present

## 2013-08-28 DIAGNOSIS — F17200 Nicotine dependence, unspecified, uncomplicated: Secondary | ICD-10-CM | POA: Diagnosis present

## 2013-08-28 DIAGNOSIS — Z8041 Family history of malignant neoplasm of ovary: Secondary | ICD-10-CM | POA: Diagnosis not present

## 2013-08-28 DIAGNOSIS — G992 Myelopathy in diseases classified elsewhere: Secondary | ICD-10-CM | POA: Diagnosis present

## 2013-08-28 DIAGNOSIS — D638 Anemia in other chronic diseases classified elsewhere: Secondary | ICD-10-CM | POA: Diagnosis present

## 2013-08-28 DIAGNOSIS — Z87828 Personal history of other (healed) physical injury and trauma: Secondary | ICD-10-CM | POA: Diagnosis not present

## 2013-08-28 DIAGNOSIS — C7951 Secondary malignant neoplasm of bone: Secondary | ICD-10-CM | POA: Diagnosis present

## 2013-08-28 DIAGNOSIS — C773 Secondary and unspecified malignant neoplasm of axilla and upper limb lymph nodes: Secondary | ICD-10-CM | POA: Diagnosis present

## 2013-08-28 DIAGNOSIS — A419 Sepsis, unspecified organism: Secondary | ICD-10-CM | POA: Diagnosis present

## 2013-08-28 DIAGNOSIS — E43 Unspecified severe protein-calorie malnutrition: Secondary | ICD-10-CM | POA: Diagnosis present

## 2013-08-28 DIAGNOSIS — F3289 Other specified depressive episodes: Secondary | ICD-10-CM | POA: Diagnosis present

## 2013-08-28 DIAGNOSIS — C787 Secondary malignant neoplasm of liver and intrahepatic bile duct: Secondary | ICD-10-CM | POA: Diagnosis present

## 2013-08-28 DIAGNOSIS — G9340 Encephalopathy, unspecified: Secondary | ICD-10-CM

## 2013-08-28 DIAGNOSIS — K5900 Constipation, unspecified: Secondary | ICD-10-CM | POA: Diagnosis present

## 2013-08-28 DIAGNOSIS — C50919 Malignant neoplasm of unspecified site of unspecified female breast: Secondary | ICD-10-CM | POA: Diagnosis present

## 2013-08-28 DIAGNOSIS — F329 Major depressive disorder, single episode, unspecified: Secondary | ICD-10-CM | POA: Diagnosis present

## 2013-08-28 DIAGNOSIS — C801 Malignant (primary) neoplasm, unspecified: Secondary | ICD-10-CM

## 2013-08-28 DIAGNOSIS — Z170 Estrogen receptor positive status [ER+]: Secondary | ICD-10-CM | POA: Diagnosis not present

## 2013-08-28 DIAGNOSIS — Z79899 Other long term (current) drug therapy: Secondary | ICD-10-CM | POA: Diagnosis not present

## 2013-08-28 DIAGNOSIS — S32039A Unspecified fracture of third lumbar vertebra, initial encounter for closed fracture: Secondary | ICD-10-CM

## 2013-08-28 DIAGNOSIS — E038 Other specified hypothyroidism: Secondary | ICD-10-CM | POA: Diagnosis present

## 2013-08-28 DIAGNOSIS — R29898 Other symptoms and signs involving the musculoskeletal system: Secondary | ICD-10-CM | POA: Diagnosis present

## 2013-08-28 DIAGNOSIS — J449 Chronic obstructive pulmonary disease, unspecified: Secondary | ICD-10-CM | POA: Diagnosis present

## 2013-08-28 DIAGNOSIS — G8220 Paraplegia, unspecified: Secondary | ICD-10-CM | POA: Diagnosis present

## 2013-08-28 DIAGNOSIS — R509 Fever, unspecified: Secondary | ICD-10-CM | POA: Diagnosis present

## 2013-08-28 DIAGNOSIS — G9341 Metabolic encephalopathy: Secondary | ICD-10-CM | POA: Diagnosis present

## 2013-08-28 DIAGNOSIS — F1210 Cannabis abuse, uncomplicated: Secondary | ICD-10-CM | POA: Diagnosis present

## 2013-08-28 DIAGNOSIS — M8448XA Pathological fracture, other site, initial encounter for fracture: Secondary | ICD-10-CM | POA: Diagnosis present

## 2013-08-28 DIAGNOSIS — C7952 Secondary malignant neoplasm of bone marrow: Secondary | ICD-10-CM

## 2013-08-28 DIAGNOSIS — E785 Hyperlipidemia, unspecified: Secondary | ICD-10-CM | POA: Diagnosis present

## 2013-08-28 DIAGNOSIS — I2699 Other pulmonary embolism without acute cor pulmonale: Secondary | ICD-10-CM | POA: Diagnosis present

## 2013-08-28 DIAGNOSIS — IMO0002 Reserved for concepts with insufficient information to code with codable children: Secondary | ICD-10-CM | POA: Diagnosis not present

## 2013-08-28 DIAGNOSIS — S32019A Unspecified fracture of first lumbar vertebra, initial encounter for closed fracture: Secondary | ICD-10-CM | POA: Insufficient documentation

## 2013-08-28 DIAGNOSIS — S32009A Unspecified fracture of unspecified lumbar vertebra, initial encounter for closed fracture: Secondary | ICD-10-CM

## 2013-08-28 DIAGNOSIS — F1910 Other psychoactive substance abuse, uncomplicated: Secondary | ICD-10-CM | POA: Insufficient documentation

## 2013-08-28 DIAGNOSIS — M48061 Spinal stenosis, lumbar region without neurogenic claudication: Secondary | ICD-10-CM | POA: Diagnosis present

## 2013-08-28 DIAGNOSIS — D1803 Hemangioma of intra-abdominal structures: Secondary | ICD-10-CM | POA: Diagnosis present

## 2013-08-28 DIAGNOSIS — R748 Abnormal levels of other serum enzymes: Secondary | ICD-10-CM | POA: Diagnosis present

## 2013-08-28 LAB — COMPREHENSIVE METABOLIC PANEL
ALT: 17 U/L (ref 0–35)
AST: 19 U/L (ref 0–37)
Albumin: 2.4 g/dL — ABNORMAL LOW (ref 3.5–5.2)
Alkaline Phosphatase: 122 U/L — ABNORMAL HIGH (ref 39–117)
Anion gap: 13 (ref 5–15)
BUN: 15 mg/dL (ref 6–23)
CALCIUM: 10.6 mg/dL — AB (ref 8.4–10.5)
CO2: 25 meq/L (ref 19–32)
CREATININE: 0.62 mg/dL (ref 0.50–1.10)
Chloride: 104 mEq/L (ref 96–112)
GLUCOSE: 108 mg/dL — AB (ref 70–99)
Potassium: 3.8 mEq/L (ref 3.7–5.3)
Sodium: 142 mEq/L (ref 137–147)
Total Bilirubin: 0.2 mg/dL — ABNORMAL LOW (ref 0.3–1.2)
Total Protein: 6.4 g/dL (ref 6.0–8.3)

## 2013-08-28 LAB — CBC WITH DIFFERENTIAL/PLATELET
Basophils Absolute: 0 10*3/uL (ref 0.0–0.1)
Basophils Relative: 0 % (ref 0–1)
EOS ABS: 0.1 10*3/uL (ref 0.0–0.7)
Eosinophils Relative: 1 % (ref 0–5)
HCT: 34 % — ABNORMAL LOW (ref 36.0–46.0)
Hemoglobin: 10.6 g/dL — ABNORMAL LOW (ref 12.0–15.0)
Lymphocytes Relative: 43 % (ref 12–46)
Lymphs Abs: 4.5 10*3/uL — ABNORMAL HIGH (ref 0.7–4.0)
MCH: 29.4 pg (ref 26.0–34.0)
MCHC: 31.2 g/dL (ref 30.0–36.0)
MCV: 94.2 fL (ref 78.0–100.0)
Monocytes Absolute: 0.9 10*3/uL (ref 0.1–1.0)
Monocytes Relative: 9 % (ref 3–12)
NEUTROS PCT: 47 % (ref 43–77)
Neutro Abs: 4.8 10*3/uL (ref 1.7–7.7)
PLATELETS: 352 10*3/uL (ref 150–400)
RBC: 3.61 MIL/uL — AB (ref 3.87–5.11)
RDW: 14 % (ref 11.5–15.5)
WBC: 10.3 10*3/uL (ref 4.0–10.5)

## 2013-08-28 LAB — RAPID URINE DRUG SCREEN, HOSP PERFORMED
Amphetamines: NOT DETECTED
BARBITURATES: NOT DETECTED
BENZODIAZEPINES: POSITIVE — AB
Cocaine: NOT DETECTED
Opiates: NOT DETECTED
Tetrahydrocannabinol: POSITIVE — AB

## 2013-08-28 LAB — AMMONIA: AMMONIA: 22 umol/L (ref 11–60)

## 2013-08-28 LAB — PROCALCITONIN

## 2013-08-28 LAB — MRSA PCR SCREENING: MRSA by PCR: NEGATIVE

## 2013-08-28 MED ORDER — MORPHINE SULFATE 2 MG/ML IJ SOLN: 2.0000 mg | INTRAMUSCULAR | Status: DC | PRN

## 2013-08-28 MED ORDER — DEXAMETHASONE SODIUM PHOSPHATE 4 MG/ML IJ SOLN
4.0000 mg | Freq: Four times a day (QID) | INTRAMUSCULAR | Status: DC
  Administered 2013-08-28 – 2013-09-03 (×22): 4 mg via INTRAVENOUS
  Filled 2013-08-28 (×28): qty 1

## 2013-08-28 MED ORDER — SIMVASTATIN 40 MG PO TABS
40.0000 mg | ORAL_TABLET | Freq: Every day | ORAL | Status: DC
  Administered 2013-08-28 – 2013-09-05 (×9): 40 mg via ORAL
  Filled 2013-08-28 (×9): qty 1

## 2013-08-28 MED ORDER — HEPARIN SODIUM (PORCINE) 5000 UNIT/ML IJ SOLN
5000.0000 [IU] | Freq: Three times a day (TID) | INTRAMUSCULAR | Status: DC
  Administered 2013-08-28 (×4): 5000 [IU] via SUBCUTANEOUS
  Filled 2013-08-28 (×4): qty 1

## 2013-08-28 MED ORDER — ENSURE COMPLETE PO LIQD
237.0000 mL | Freq: Two times a day (BID) | ORAL | Status: DC
  Administered 2013-08-28 – 2013-09-04 (×13): 237 mL via ORAL

## 2013-08-28 MED ORDER — IOHEXOL 300 MG/ML  SOLN
25.0000 mL | INTRAMUSCULAR | Status: AC
  Administered 2013-08-28 (×2): 25 mL via ORAL

## 2013-08-28 MED ORDER — DEXAMETHASONE SODIUM PHOSPHATE 4 MG/ML IJ SOLN
4.0000 mg | Freq: Four times a day (QID) | INTRAMUSCULAR | Status: DC
  Filled 2013-08-28 (×3): qty 1

## 2013-08-28 MED ORDER — TOPIRAMATE 100 MG PO TABS
100.0000 mg | ORAL_TABLET | Freq: Every day | ORAL | Status: DC
  Administered 2013-08-28 – 2013-09-04 (×9): 100 mg via ORAL
  Filled 2013-08-28 (×11): qty 1

## 2013-08-28 MED ORDER — LEVOTHYROXINE SODIUM 75 MCG PO TABS
75.0000 ug | ORAL_TABLET | ORAL | Status: DC
  Administered 2013-08-29 – 2013-09-05 (×6): 75 ug via ORAL
  Filled 2013-08-28 (×9): qty 1

## 2013-08-28 MED ORDER — HYDROMORPHONE HCL PF 1 MG/ML IJ SOLN
0.5000 mg | Freq: Once | INTRAMUSCULAR | Status: AC
  Administered 2013-08-28: 0.5 mg via INTRAVENOUS
  Filled 2013-08-28: qty 1

## 2013-08-28 MED ORDER — ONDANSETRON HCL 4 MG/2ML IJ SOLN: 4.0000 mg | Freq: Four times a day (QID) | INTRAMUSCULAR | Status: DC | PRN

## 2013-08-28 MED ORDER — OXYCODONE-ACETAMINOPHEN 5-325 MG PO TABS
1.0000 | ORAL_TABLET | Freq: Four times a day (QID) | ORAL | Status: DC | PRN
  Administered 2013-08-28 (×3): 2 via ORAL
  Administered 2013-08-29: 1 via ORAL
  Administered 2013-08-29 – 2013-08-30 (×4): 2 via ORAL
  Administered 2013-08-31: 1 via ORAL
  Administered 2013-08-31 – 2013-09-05 (×17): 2 via ORAL
  Filled 2013-08-28 (×26): qty 2

## 2013-08-28 MED ORDER — HYDROMORPHONE HCL PF 1 MG/ML IJ SOLN
0.5000 mg | INTRAMUSCULAR | Status: DC | PRN
  Administered 2013-08-29 – 2013-09-01 (×2): 0.5 mg via INTRAVENOUS
  Filled 2013-08-28 (×2): qty 1

## 2013-08-28 MED ORDER — OXYCODONE-ACETAMINOPHEN 5-325 MG PO TABS
1.0000 | ORAL_TABLET | Freq: Once | ORAL | Status: AC
  Administered 2013-08-28: 1 via ORAL
  Filled 2013-08-28: qty 1

## 2013-08-28 MED ORDER — SODIUM CHLORIDE 0.9 % IV SOLN
INTRAVENOUS | Status: DC
  Administered 2013-08-28 (×2): 150 mL/h via INTRAVENOUS
  Administered 2013-08-29: 01:00:00 via INTRAVENOUS

## 2013-08-28 MED ORDER — DIAZEPAM 5 MG PO TABS
10.0000 mg | ORAL_TABLET | Freq: Three times a day (TID) | ORAL | Status: DC | PRN
  Administered 2013-08-28 – 2013-09-05 (×17): 10 mg via ORAL
  Filled 2013-08-28 (×17): qty 2

## 2013-08-28 MED ORDER — DULOXETINE HCL 60 MG PO CPEP
60.0000 mg | ORAL_CAPSULE | Freq: Every day | ORAL | Status: DC
  Administered 2013-08-28 – 2013-09-04 (×9): 60 mg via ORAL
  Filled 2013-08-28 (×11): qty 1

## 2013-08-28 MED ORDER — IOHEXOL 300 MG/ML  SOLN
80.0000 mL | Freq: Once | INTRAMUSCULAR | Status: AC | PRN
Start: 2013-08-28 — End: 2013-08-28
  Administered 2013-08-28: 80 mL via INTRAVENOUS

## 2013-08-28 MED ORDER — GABAPENTIN 600 MG PO TABS
1200.0000 mg | ORAL_TABLET | Freq: Three times a day (TID) | ORAL | Status: DC
  Administered 2013-08-28 – 2013-09-02 (×15): 1200 mg via ORAL
  Filled 2013-08-28 (×19): qty 2

## 2013-08-28 MED ORDER — DEXAMETHASONE SODIUM PHOSPHATE 10 MG/ML IJ SOLN
10.0000 mg | Freq: Once | INTRAMUSCULAR | Status: AC
  Administered 2013-08-28: 10 mg via INTRAVENOUS
  Filled 2013-08-28: qty 1

## 2013-08-28 NOTE — H&P (Addendum)
Hospitalist Admission History and Physical  Patient name: Kristie Jackson Medical record number: [id number] Date of birth: 24-Jun-1955 Age: 58 y.o. Gender: female  Primary Care Provider: Horton Finer, MD  Chief Complaint: sepsis, encephalopathy   History of Present Illness:This is a 58 y.o. 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 Pt was noted to have a temp > 102 at home by caregivers. Pt states that she has been relatively asymptomatic. Pt denies any fevers, chills, nausea, vomiting, diarrhea. Is functionally incontinent, though with some function of LEs. Denies any recent illnessess. Is currently being followed at [facility] for recurrence of paraplgia w/in the last 3-6 months. Pt states that she had a C3-C4 injury around 15 years ago and made a spontaneous/complete recovery, but woke up 3 months ago with inability to walk. Has been seen by neurologist at [facility]. Per pt, recent MRI shows metastatic disease in spine. Work up still in progress per pt.  On presentation, t max 101.7, HR 60s-90s, BP 80s-100s s/p 2L NS bolus, Satting upper 80s on RA-improved with 2L [place]. WBC 11.6, hgb 11.4, Cr 0.65, BUN 16, Ca 11.5, glu 100. Lipase < 10, lactate 0.73. UA not indicative of infection. ETOH level WNL. UDS pending. Started on vanc and zosyn empirically. CXR w/ COPD and basilar atelectasis w/o definitive PNA.   Assessment and Plan: Kristie Jackson is a 58 y.o. year old female presenting with sepsis, encephalopathy.   Sepsis: Unclear source currently. Pan culture. Broad spectrum abx. IVFs. Stepdown bed. Pressures seem to be improving w/ IVFs. Lactate WNL. Consult PCCM as indicated. PCT pending.   Encephalopathy: Suspect likely secondary to above, though medication induced also consideration given medication list. Hold sedating/neurotropic meds.  Ammonia level. F/u on UDS. Head CT to assess for any overt malignancy. Continue to follow.   Malignancy: known metastatic disease in spine per pt. ? Primary. ? Lung among others-colon, breast(long time smoker). Followed at [facility]. May need medical records to coorelate with current presentation.   FEN/GI: heart healthy diet. Noted elevated Ca in setting of malignancy. Hydrate. Check ionized ca and PTH. Prophylaxis: subq heparin  Disposition: pending further evaluation  Code Status:Full Code    Patient Active Problem List   Diagnosis Date Noted  . Sepsis 08/28/2013   Past Medical History: Past Medical History  Diagnosis Date  . Central hypothyroidism   . Hyperlipidemia   . Spinal cord injury   . Depression   . Cancer     spinal metastasis    Past Surgical History: Past Surgical History  Procedure Laterality Date  . Laminectomy    . Abdominal hysterectomy      Social History: History   Social History  . Marital Status: Unknown    Spouse Name: N/A    Number of Children: N/A  . Years of Education: N/A   Social History Main Topics  . Smoking status: Current Every Day Smoker -- 1.00 packs/day    Types: Cigarettes  . Smokeless tobacco: None  . Alcohol Use: Yes  . Drug Use: No  . Sexual Activity: None   Other Topics Concern  . None   Social History Narrative  . None    Family History: No family history on file.  Allergies: Allergies  Allergen Reactions  . Morphine And Related Nausea And Vomiting    Current Facility-Administered Medications  Medication Dose Route Frequency Provider Last Rate  Last Dose  . 0.9 %  sodium chloride infusion  1,000 mL Intravenous Continuous Marissa Sciacca, PA-C 125 mL/hr at 08/27/13 2218 1,000 mL at 08/27/13 2218  . 0.9 %  sodium chloride infusion   Intravenous Continuous Shanda Howells, MD      . acetaminophen (TYLENOL) tablet 650 mg  650 mg Oral Q6H PRN Marissa Sciacca, PA-C   650 mg at 08/27/13 2042  . DULoxetine (CYMBALTA) DR  capsule 60 mg  60 mg Oral QHS Shanda Howells, MD      . gabapentin (NEURONTIN) tablet 1,200 mg  1,200 mg Oral TID Shanda Howells, MD      . heparin injection 5,000 Units  5,000 Units Subcutaneous 3 times per day Shanda Howells, MD      . levothyroxine (SYNTHROID, LEVOTHROID) tablet 75 mcg  75 mcg Oral See admin instructions Shanda Howells, MD      . simvastatin (ZOCOR) tablet 40 mg  40 mg Oral Daily Shanda Howells, MD      . topiramate (TOPAMAX) tablet 100 mg  100 mg Oral QHS Shanda Howells, MD      . vancomycin (VANCOCIN) IVPB 1000 mg/200 mL premix  1,000 mg Intravenous STAT Alfonzo Feller, DO 200 mL/hr at 08/27/13 2338 1,000 mg at 08/27/13 2338   Current Outpatient Prescriptions  Medication Sig Dispense Refill  . acetaminophen (TYLENOL) 500 MG tablet Take 1,000 mg by mouth every 6 (six) hours as needed for moderate pain.      . diazepam (VALIUM) 10 MG tablet Take 10 mg by mouth every 6 (six) hours as needed. For muscle spasms      . DULoxetine (CYMBALTA) 60 MG capsule Take 60 mg by mouth at bedtime.      . gabapentin (NEURONTIN) 600 MG tablet Take 1,200 mg by mouth 3 (three) times daily.      Marland Kitchen levothyroxine (SYNTHROID, LEVOTHROID) 75 MCG tablet Take 75 mcg by mouth See admin instructions. Takes daily except Monday and Thursday      . oxyCODONE-acetaminophen (PERCOCET/ROXICET) 5-325 MG per tablet Take 1-2 tablets by mouth every 6 (six) hours as needed for severe pain.  20 tablet  0  . rizatriptan (MAXALT) 10 MG tablet Take 10 mg by mouth as needed for migraine. May repeat in 2 hours if needed      . simvastatin (ZOCOR) 40 MG tablet Take 40 mg by mouth daily.      Marland Kitchen tiZANidine (ZANAFLEX) 4 MG tablet Take 8 mg by mouth 3 (three) times daily.      Marland Kitchen topiramate (TOPAMAX) 100 MG tablet Take 100 mg by mouth at bedtime.       Review Of Systems: 12 point ROS negative except as noted above in HPI.  Physical Exam: Filed Vitals:   08/27/13 2359  BP: 106/69  Pulse: 84  Temp:   Resp: 15     General: somnolent, mildly cooperative to exam  HEENT: PERRLA, extra ocular movement intact and dry oral mucoas  Heart: S1, S2 normal, no murmur, rub or gallop, regular rate and rhythm Lungs: clear to auscultation, no wheezes or rales and unlabored breathing Abdomen: abdomen is soft without significant tenderness, masses, organomegaly or guarding Extremities: extremities normal, atraumatic, no cyanosis or edema Skin:no rashes, no ecchymoses Neurology: limited functionality in LEs, minimally cooperative to UE exam , no nuchal rigidity   Labs and Imaging: Lab Results  Component Value Date/Time   NA 139 08/27/2013  8:35 PM   K 4.2 08/27/2013  8:35 PM  CL 98 08/27/2013  8:35 PM   CO2 26 08/27/2013  8:35 PM   BUN 16 08/27/2013  8:35 PM   CREATININE 0.65 08/27/2013  8:35 PM   GLUCOSE 100* 08/27/2013  8:35 PM   Lab Results  Component Value Date   WBC 11.6* 08/27/2013   HGB 11.4* 08/27/2013   HCT 35.7* 08/27/2013   MCV 92.7 08/27/2013   PLT 401* 08/27/2013   Urinalysis    Component Value Date/Time   COLORURINE YELLOW 08/27/2013 2205   APPEARANCEUR HAZY* 08/27/2013 2205   LABSPEC 1.019 08/27/2013 2205   PHURINE 6.0 08/27/2013 2205   GLUCOSEU NEGATIVE 08/27/2013 2205   HGBUR NEGATIVE 08/27/2013 2205   BILIRUBINUR NEGATIVE 08/27/2013 2205   KETONESUR NEGATIVE 08/27/2013 2205   PROTEINUR NEGATIVE 08/27/2013 2205   UROBILINOGEN 0.2 08/27/2013 2205   NITRITE NEGATIVE 08/27/2013 2205   LEUKOCYTESUR NEGATIVE 08/27/2013 2205       Dg Chest Port 1 View  08/27/2013   CLINICAL DATA:  Fever.  Recent diagnosis of cancer  EXAM: PORTABLE CHEST - 1 VIEW  COMPARISON:  None.  FINDINGS: Small streaky opacities in the lower lungs. No definitive pneumonia. No edema, effusion, or pneumothorax. There is pulmonary hyperinflation suggesting COPD. Normal heart size and upper mediastinal contours when accounting for distortion from rightward rotation.  IMPRESSION: COPD and mild basilar atelectasis. No definitive  pneumonia to explain fever.   Electronically Signed   By: Jorje Guild M.D.   On: 08/27/2013 21:49           Shanda Howells MD  Pager: [phone]

## 2013-08-28 NOTE — ED Provider Notes (Signed)
Medical screening examination/treatment/procedure(s) were conducted as a shared visit with non-physician practitioner(s) and myself.  I personally evaluated the patient during the encounter.  58yo F, c/o fever that started today. Pt's home CNA checked pt's temp and it was "101.5." Recent dx metastatic CA. Pt endorses decreased appetite and fluids intake for the past 3 months, worse over the past several days. Hypotensive, febrile. A&O, dry mucus membranes, CTA, RRR, abd soft/NT, LE's paresis per hx/moves UE's spontaneously. Lactic acid normal. Leukocytosis on CBC. Udip and CXR without definitive infection. APAP for fever, IVF for hypotension with good response, broad spectrum IV abx after BC and UC, admit.   CRITICAL CARE Performed by: Alfonzo Feller Total critical care time: 35 Critical care time was exclusive of separately billable procedures and treating other patients. Critical care was necessary to treat or prevent imminent or life-threatening deterioration. Critical care was time spent personally by me on the following activities: development of treatment plan with patient and/or surrogate as well as nursing, discussions with consultants, evaluation of patient's response to treatment, examination of patient, obtaining history from patient or surrogate, ordering and performing treatments and interventions, ordering and review of laboratory studies, ordering and review of radiographic studies, pulse oximetry and re-evaluation of patient's condition.       Alfonzo Feller, DO 08/28/13 1553

## 2013-08-28 NOTE — Progress Notes (Addendum)
[facility] TEAM 1 - Stepdown/ICU TEAM Progress Note  Kristie Jackson JHE:[id number] DOB: Aug 01, 1955 DOA: 08/27/2013 PCP: Horton Finer, MD  Admit HPI / Brief Narrative: 58 y.o. 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 Pt was noted to have a temp > 102 at home by caregivers. Pt states that she has been relatively asymptomatic. Pt denies any fevers, chills, nausea, vomiting, diarrhea. Is functionally incontinent, though with some function of LEs. Denies any recent illnessess. Is currently being followed at [facility] for recurrence of paraplgia w/in the last 3-6 months. Pt states that she had a C3-C4 injury around 15 years ago and made a spontaneous/complete recovery, but woke up 3 months ago with inability to walk. Has been seen by neurologist at [facility]. Per pt, recent MRI shows metastatic disease in spine. Work up still in progress per pt.  On presentation, t max 101.7, HR 60s-90s, BP 80s-100s s/p 2L NS bolus, Satting upper 80s on RA-improved with 2L [place]. WBC 11.6, hgb 11.4, Cr 0.65, BUN 16, Ca 11.5, glu 100. Lipase < 10, lactate 0.73. UA not indicative of infection. ETOH level WNL. UDS pending. Started on vanc and zosyn empirically. CXR w/ COPD and basilar atelectasis w/o definitive PNA.    HPI/Subjective: 7/16 A./O. x4, states that woke up 4 months ago and was unable to ambulate. Did not seek medical treatment acutely rationalizing that it was probably only a pulled muscle. States over the last few months increasing weakness bilateral lower extremity, increasing loss of bowel or bladder function. States at [facility] had an EMG which was abnormal and showed a demyelinating process (no access to those records). Also received a L-spine MRI showing a fracture of L3/L1 (most likely pathologic). Did have followup arranged at [facility] for workup of metastatic cancer disease, however found her self year  secondary to mental status change, increasing lower back pain with decreasing lower extremity strength. NOTE; patient states has not had annual screenings I E. negative colonoscopy, negative breast mammograms.    Assessment/Plan: Sepsis:  -Recommend patient is septic, negative fever today, negative left shift, negative bands. -Given that patient most likely has a widely metastatic neoplasm will not continue antibiotic. Counseled patient this could be the cause of her initial fever, and leukocytosis. -Continue normal saline at 175ml/hr - Encephalopathy:  -Clearing, patient was A./O. x4 today most likely multifactorial to include overuse of pain medication, dehydration, and malignancy (metastases to brain?)  Metastatic cancer unknown origin  -Patient has elected to stay at [facility] for cancer workup. Obtain CT chest/ abdomen and pelvis with contrast.  -Given the findings of the head CT (lytic lesion) see results below obtain brain MRI with contrast. -Obtain C./T./L spine MRI secondary patient's acute mental status change and increasing lower extremity paralysis.  -Obtain SPEP, UPEP  L-spine fracture -Compression fracture at L3 and L1, but patient's history most likely subacute. Although no mention of cord compression her symptoms fit the picture. Start Decadron 10 mg bolus x1 then 4 mg q6hr -Dependent on findings of CT/MRI will need to consult neurology and or neurosurgery in the a.m.  Substance abuse -7/15 UDS positive for marijuana -Continue current regimen for pain control -Add morphine IV 2 mg prior to taking patient for radiologic exams. -      Code Status: FULL Family Communication: no family present at time of exam Disposition Plan: Cancer workup      Consultants: NA  Procedure/Significant Events: 7/2 MRI  L-spine without contrast (at [facility]) - multiple bony lesions suspicious for metastatic disease.  -Moderate compression of the L3 vertebral body with retropulsion  posterolaterally to the left, and minimal compression of the L1 vertebral body.  7/15 PCXR; COPD and mild basilar atelectasis. No pneumonia  7/16 CT head without contrast; No acute intracranial findings.  - Multi focal calvarial metastatic disease. There is multi focal erosion through the inner table, including over the superior sagittal sinus.    Culture 7/15 Blood culture pending  7/15 Urine in pending 7/16 MRSA by PCR negative    Antibiotics: Zosyn 7/15x1 Vancomycin 7/15x1    DVT prophylaxis: Subcutaneous heparin    Devices NA    LINES / TUBES:  7/15 20ga left hand 7/15 20ga left forearm    Continuous Infusions: . sodium chloride 1,000 mL (08/27/13 2218)  . sodium chloride 150 mL/hr (08/28/13 1554)    Objective: VITAL SIGNS: Temp: 97.7 F (36.5 C) (07/16 1635) Temp src: Oral (07/16 1635) BP: 103/62 mmHg (07/16 1635) Pulse Rate: 70 (07/16 1635) SPO2; 100% on 2 L O2 via  FIO2:   Intake/Output Summary (Last 24 hours) at 08/28/13 1743 Last data filed at 08/28/13 1100  Gross per 24 hour  Intake   1500 ml  Output      0 ml  Net   1500 ml     Exam: General: A./O. x4, chronic lower back pain secondary to L3 compression, No acute respiratory distress Lungs: Clear to auscultation bilaterally without wheezes or crackles Cardiovascular: Regular rate and rhythm without murmur gallop or rub normal S1 and S2 Abdomen: Nontender, nondistended, soft, bowel sounds positive, no rebound, no ascites, no appreciable mass Extremities: No significant cyanosis, clubbing, or edema bilateral lower extremities Neurologic; cranial nerves II through XII intact, tongue/uvula midline, upper extremity strength 5/5 bilateral, upper extremities sensation intact, bilateral lower extremity strength 2/5, bilateral lower extremity sensation to soft touch intact, bilateral lower extremity sensation to pinprick diminished to waist Rt > Lt, negative Babinski, bilateral knee DTR 2+, did not  attempt to ambulate patient secondary to requiring 2 person assist in order to come to seated position.     Data Reviewed: Basic Metabolic Panel:  Recent Labs Lab 08/27/13 2035 08/28/13 0039  NA 139 142  K 4.2 3.8  CL 98 104  CO2 26 25  GLUCOSE 100* 108*  BUN 16 15  CREATININE 0.65 0.62  CALCIUM 11.5* 10.6*   Liver Function Tests:  Recent Labs Lab 08/27/13 2035 08/28/13 0039  AST 21 19  ALT 18 17  ALKPHOS 135* 122*  BILITOT 0.2* 0.2*  PROT 7.0 6.4  ALBUMIN 2.7* 2.4*    Recent Labs Lab 08/27/13 2035  LIPASE 10*    Recent Labs Lab 08/28/13 0039  AMMONIA 22   CBC:  Recent Labs Lab 08/27/13 2035 08/28/13 0039  WBC 11.6* 10.3  NEUTROABS 5.7 4.8  HGB 11.4* 10.6*  HCT 35.7* 34.0*  MCV 92.7 94.2  PLT 401* 352   Cardiac Enzymes: No results found for this basename: CKTOTAL, CKMB, CKMBINDEX, TROPONINI,  in the last 168 hours BNP (last 3 results) No results found for this basename: PROBNP,  in the last 8760 hours CBG: No results found for this basename: GLUCAP,  in the last 168 hours  Recent Results (from the past 240 hour(s))  MRSA PCR SCREENING     Status: None   Collection Time    08/28/13  1:55 AM      Result Value Ref Range Status  MRSA by PCR NEGATIVE  NEGATIVE Final   Comment:            The GeneXpert MRSA Assay (FDA     approved for NASAL specimens     only), is one component of a     comprehensive MRSA colonization     surveillance program. It is not     intended to diagnose MRSA     infection nor to guide or     monitor treatment for     MRSA infections.     Studies:  Recent x-ray studies have been reviewed in detail by the Attending Physician  Scheduled Meds:  Scheduled Meds: . dexamethasone  10 mg Intravenous Once  . dexamethasone  4 mg Intravenous 4 times per day  . DULoxetine  60 mg Oral QHS  . feeding supplement (ENSURE COMPLETE)  237 mL Oral BID BM  . gabapentin  1,200 mg Oral TID  . heparin  5,000 Units Subcutaneous  3 times per day  . [START ON 08/29/2013] levothyroxine  75 mcg Oral Once per day on Sun Tue Wed Fri Sat  . simvastatin  40 mg Oral Daily  . topiramate  100 mg Oral QHS    Time spent on care of this patient: 40 mins   Allie Bossier , MD   [facility] Office  [phone] Pager [phone]  On-Call/Text Page:      [email or web address]      password [email or web address]  If 7PM-7AM, please contact night-coverage [email or web address] Password [email or web address] 08/28/2013, 5:43 PM   LOS: 1 day

## 2013-08-28 NOTE — Progress Notes (Signed)
Utilization review completed. Andres Bantz, RN, BSN. 

## 2013-08-28 NOTE — Progress Notes (Addendum)
INITIAL NUTRITION ASSESSMENT  DOCUMENTATION CODES Per approved criteria  -Severe malnutrition in the context of chronic illness   INTERVENTION:  Vanilla Ensure Complete po BID, each supplement provides 350 kcal and 13 grams of protein RD to follow for nutrition care plan  NUTRITION DIAGNOSIS: Inadequate oral intake related to poor appetite, pain as evidenced by patient report  Goal: Pt to meet >/= 90% of their estimated nutrition needs   Monitor:  PO & supplemental intake, weight, labs, I/O's  Reason for Assessment: Low Braden  58 y.o. female  Admitting Dx: sepsis, encephalopathy   ASSESSMENT: 58 y.o. year old female with significant past medical history of spinal cord injury w/ semi functional paraplegia, tobacco abuse, hypothyroidism, spinal metastases followed at [place] presenting with sepsis, encephalopathy.    Patient reports a decreased appetite; doesn't eat when she is in pain; does not consistently eat 3 meals per day; grazes throughout the day; does like Boost supplements; also endorses a 30 lb weight loss in the past 3-4 months (17%) -- severe for time frame; would benefit from addition of supplements during hospitalization -- RD to order.  Low braden score places patient at risk for skin breakdown.  Nutrition Focused Physical Exam:  Subcutaneous Fat:  Orbital Region: N/A Upper Arm Region: moderate-severe depletion Thoracic and Lumbar Region: N/A  Muscle:  Temple Region: moderate depletion Clavicle Bone Region: moderate-severe depletion Clavicle and Acromion Bone Region: moderate-severe depletion Scapular Bone Region: N/A Dorsal Hand: N/A Patellar Region: N/A Anterior Thigh Region: N/A Posterior Calf Region: N/A  Edema: none  Patient meets criteria for severe malnutrition in the context of chronic illness as evidenced by suspected intake of < 75% of estimated energy requirement for > 1 month, 17% weight loss x 3-4 months and moderate-severe muscle &  subcutaneous fat loss.  Height: Ht Readings from Last 1 Encounters:  08/27/13 5\' 8"  (1.727 m)    Weight: Wt Readings from Last 1 Encounters:  08/28/13 150 lb 2.1 oz (68.1 kg)    Ideal Body Weight: 140 lb  % Ideal Body Weight: 107%  Wt Readings from Last 10 Encounters:  08/28/13 150 lb 2.1 oz (68.1 kg)  07/09/13 150 lb (68.04 kg)    Usual Body Weight: ~ 180 lb  % Usual Body Weight: 83%  BMI:  Body mass index is 22.83 kg/(m^2).  Estimated Nutritional Needs: Kcal: 1800-2000 Protein: 90-100 gm Fluid: 1.8-2.0 L  Skin: Intact  Diet Order: Cardiac  EDUCATION NEEDS: -No education needs identified at this time   Intake/Output Summary (Last 24 hours) at 08/28/13 1205 Last data filed at 08/28/13 1100  Gross per 24 hour  Intake   1500 ml  Output      0 ml  Net   1500 ml    Labs:   Recent Labs Lab 08/27/13 2035 08/28/13 0039  NA 139 142  K 4.2 3.8  CL 98 104  CO2 26 25  BUN 16 15  CREATININE 0.65 0.62  CALCIUM 11.5* 10.6*  GLUCOSE 100* 108*    Scheduled Meds: . DULoxetine  60 mg Oral QHS  . gabapentin  1,200 mg Oral TID  . heparin  5,000 Units Subcutaneous 3 times per day  . [START ON 08/29/2013] levothyroxine  75 mcg Oral Once per day on Sun Tue Wed Fri Sat  . simvastatin  40 mg Oral Daily  . topiramate  100 mg Oral QHS    Continuous Infusions: . sodium chloride 1,000 mL (08/27/13 2218)  . sodium chloride 150 mL/hr (08/28/13  0834)    Past Medical History  Diagnosis Date  . Central hypothyroidism   . Hyperlipidemia   . Spinal cord injury   . Depression   . Cancer     spinal metastasis    Past Surgical History  Procedure Laterality Date  . Laminectomy    . Abdominal hysterectomy      Arthur Holms, RD, LDN Pager #: [phone] After-Hours Pager #: [phone]

## 2013-08-29 ENCOUNTER — Inpatient Hospital Stay (HOSPITAL_COMMUNITY): Payer: Medicare Other

## 2013-08-29 DIAGNOSIS — G992 Myelopathy in diseases classified elsewhere: Secondary | ICD-10-CM

## 2013-08-29 DIAGNOSIS — I2699 Other pulmonary embolism without acute cor pulmonale: Secondary | ICD-10-CM | POA: Diagnosis present

## 2013-08-29 DIAGNOSIS — C7931 Secondary malignant neoplasm of brain: Secondary | ICD-10-CM

## 2013-08-29 DIAGNOSIS — C773 Secondary and unspecified malignant neoplasm of axilla and upper limb lymph nodes: Secondary | ICD-10-CM

## 2013-08-29 DIAGNOSIS — C7949 Secondary malignant neoplasm of other parts of nervous system: Secondary | ICD-10-CM

## 2013-08-29 DIAGNOSIS — C50919 Malignant neoplasm of unspecified site of unspecified female breast: Secondary | ICD-10-CM | POA: Diagnosis present

## 2013-08-29 DIAGNOSIS — C770 Secondary and unspecified malignant neoplasm of lymph nodes of head, face and neck: Secondary | ICD-10-CM

## 2013-08-29 LAB — CBC WITH DIFFERENTIAL/PLATELET
Basophils Absolute: 0 10*3/uL (ref 0.0–0.1)
Basophils Relative: 0 % (ref 0–1)
Eosinophils Absolute: 0 10*3/uL (ref 0.0–0.7)
Eosinophils Relative: 0 % (ref 0–5)
HCT: 33.3 % — ABNORMAL LOW (ref 36.0–46.0)
Hemoglobin: 10.4 g/dL — ABNORMAL LOW (ref 12.0–15.0)
LYMPHS ABS: 1.8 10*3/uL (ref 0.7–4.0)
Lymphocytes Relative: 21 % (ref 12–46)
MCH: 29 pg (ref 26.0–34.0)
MCHC: 31.2 g/dL (ref 30.0–36.0)
MCV: 92.8 fL (ref 78.0–100.0)
Monocytes Absolute: 0.1 10*3/uL (ref 0.1–1.0)
Monocytes Relative: 1 % — ABNORMAL LOW (ref 3–12)
NEUTROS PCT: 78 % — AB (ref 43–77)
Neutro Abs: 6.7 10*3/uL (ref 1.7–7.7)
Platelets: 378 10*3/uL (ref 150–400)
RBC: 3.59 MIL/uL — AB (ref 3.87–5.11)
RDW: 13.9 % (ref 11.5–15.5)
WBC: 8.6 10*3/uL (ref 4.0–10.5)

## 2013-08-29 LAB — URINE CULTURE
COLONY COUNT: NO GROWTH
CULTURE: NO GROWTH

## 2013-08-29 LAB — COMPREHENSIVE METABOLIC PANEL
ALT: 15 U/L (ref 0–35)
ANION GAP: 17 — AB (ref 5–15)
AST: 17 U/L (ref 0–37)
Albumin: 2.6 g/dL — ABNORMAL LOW (ref 3.5–5.2)
Alkaline Phosphatase: 120 U/L — ABNORMAL HIGH (ref 39–117)
BUN: 12 mg/dL (ref 6–23)
CO2: 22 meq/L (ref 19–32)
CREATININE: 0.4 mg/dL — AB (ref 0.50–1.10)
Calcium: 10.6 mg/dL — ABNORMAL HIGH (ref 8.4–10.5)
Chloride: 100 mEq/L (ref 96–112)
GFR calc Af Amer: >90 mL/min (ref >90)
GLUCOSE: 110 mg/dL — AB (ref 70–99)
Potassium: 4 mEq/L (ref 3.7–5.3)
Sodium: 139 mEq/L (ref 137–147)
Total Bilirubin: <0.2 mg/dL — ABNORMAL LOW (ref 0.3–1.2)
Total Protein: 6.7 g/dL (ref 6.0–8.3)

## 2013-08-29 LAB — PTH, INTACT AND CALCIUM
Calcium, Total (PTH): 10 mg/dL (ref 8.4–10.5)
PTH: 10.6 pg/mL — ABNORMAL LOW (ref 14.0–72.0)

## 2013-08-29 LAB — HEPARIN LEVEL (UNFRACTIONATED)
HEPARIN UNFRACTIONATED: 0.52 [IU]/mL (ref 0.30–0.70)
Heparin Unfractionated: 0.68 IU/mL (ref 0.30–0.70)

## 2013-08-29 LAB — LACTATE DEHYDROGENASE: LDH: 184 U/L (ref 94–250)

## 2013-08-29 MED ORDER — HEPARIN BOLUS VIA INFUSION
2000.0000 [IU] | Freq: Once | INTRAVENOUS | Status: AC
  Administered 2013-08-29: 2000 [IU] via INTRAVENOUS
  Filled 2013-08-29: qty 2000

## 2013-08-29 MED ORDER — GADOBENATE DIMEGLUMINE 529 MG/ML IV SOLN
14.0000 mL | Freq: Once | INTRAVENOUS | Status: AC
  Administered 2013-08-29: 14 mL via INTRAVENOUS

## 2013-08-29 MED ORDER — HYDROMORPHONE HCL PF 1 MG/ML IJ SOLN
1.0000 mg | INTRAMUSCULAR | Status: DC | PRN
  Administered 2013-08-29 – 2013-09-04 (×9): 1 mg via INTRAVENOUS
  Filled 2013-08-29 (×10): qty 1

## 2013-08-29 MED ORDER — HEPARIN (PORCINE) IN NACL 100-0.45 UNIT/ML-% IJ SOLN
1150.0000 [IU]/h | INTRAMUSCULAR | Status: DC
  Administered 2013-08-29 – 2013-08-30 (×3): 1100 [IU]/h via INTRAVENOUS
  Administered 2013-09-01: 1150 [IU]/h via INTRAVENOUS
  Filled 2013-08-29 (×7): qty 250

## 2013-08-29 MED ORDER — HYDROMORPHONE HCL PF 1 MG/ML IJ SOLN
1.0000 mg | Freq: Once | INTRAMUSCULAR | Status: AC
  Administered 2013-08-29: 1 mg via INTRAVENOUS

## 2013-08-29 MED ORDER — HYDROMORPHONE HCL PF 1 MG/ML IJ SOLN
INTRAMUSCULAR | Status: AC
  Administered 2013-08-29: 1 mg via INTRAVENOUS
  Filled 2013-08-29: qty 1

## 2013-08-29 NOTE — Consult Note (Signed)
[place] CONSULT NOTE  Patient Care Team: Horton Finer, MD as PCP - General (Internal Medicine)  CHIEF COMPLAINTS/PURPOSE OF CONSULTATION:  Probable Metastatic Breast Cancer   HISTORY OF PRESENTING ILLNESS:   Kristie Jackson 58 y.o. female asked to see for evaluation of metastatic bone lesions of unknown etiology. In review, she carries a prior history of remote cervical spine injury with residual spasticity and neuropathic pain, seen initially at [facility] on 08/22/12 due to 4 month history of right leg pain and weakness, associated with bladder incontinence,urgency and nocturia and bowel dysfunction. She had noted initially a change in her ambulation pattern around March of 2015 when she woke up to what she thought it was a ligament sprain on her right ankle, progressing to the right leg, causing falls, eventually requiring a walker and eventually bounding her to a wheelchair. She also began to experience weakness on the left leg as well. Accompanying symptoms included generalized fatigue, but no nausea, vertigo, vomiting or vision changes. She did have poor concentration and coordination. She denied any headaches. She had no cardiac or respiratory complaints. She did have a 30 lb weight loss over a 4-6 week period. No other reported symptoms.  On further questioning, the patient reports that she has a "large lump" in her right breast for about 2 months or longer, for which she did not seek medical attention. Her last mammogram was 6 or 7 years ago. At the time, she was told that her "breast were never normal and had some tissue". She reports that she has noticed right nipple discharge, which she believes is related to "the thyroid issues"  She was never exposed to fertility medications or hormone replacement therapy. She has family history of Breast/GYN/GI cancer in her mother, who had ovarian cancer at age 71. Patient has had hysterectomy due to fibroids with possible  salpingoophorectomy. Never had a DEXA scan. Has not had a Gyn evaluation in "a long time" .  MRI lumbar spine with and without contrast was performed at [facility], but records are not available. Per chart report demonstrated spinal  metastatic disease in spine. She presented on 7/16 to [facility] with sepsis and encephalopathy, and fevers up to 102, requiring IV antibiotics. Metastatic workup continued at this facility. CT imaging shows widespread osseous metastatic disease with complete destruction of the right acetabulum, a S2 sacral metastasis, L3 compression fracture, L1 metastasis and other widespread metastatic disease.   New MRI of the lumbar spine is pending. Neurosurgical consultation to proceed. Radiation Oncology, Dr. Tammi Klippel, has seen and evaluated the patient. Per notes, " MRI is pending to further characterize the potential for spinal cord compression or nerve root compression within the lumbar spine requiring neurosurgical decompression. Ultimately, she may require dorsal laminectomy with stabilization of L3 followed by adjuvant radiotherapy. With regard to her right acetabulum, she may also ultimately require right total hip placement in the near future" Radiotherapy to start as soon as patient is clinically stable.  Moreover, CT of the head shows multifocal calvarial metastatic disease.There is multifocal erosion through the inner table, including over the superior sagittal sinus. CT of the chest, abdomen and pelvis on 7/16 shows segmental pulmonary embolism to the right lower lobe,spiculated sub areolar tissue on the right. There is right axillary and deep pectoral lymphadenopathy with cortical thickening up to 1 cm, two tiny low densities in the liver focal urothelial thickening in the right bladder -a possible second malignancy and borderline enlarged right supraclavicular lymph node. MRI  brain is pending.    MEDICAL HISTORY:  Past Medical History  Diagnosis Date  . Central  hypothyroidism   . Hyperlipidemia   . Spinal cord injury 1990s  . Depression/ Anxiety   . 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 Laminectomy    . Abdominal hysterectomy      SOCIAL HISTORY: Divorced, 2 children in good health. Smokes 1ppd cigarettes for 40 years, no ETOH. Lives in [place]. Former Marine scientist.   FAMILY HISTORY: father and her uncle both had a progressive demyelinating disease that apparently was deemed genetic but un-named by [employer]. This led to progressive paresis. Mother died with ovarian cancer 5 sisters, unknown health status.    ALLERGIES:  is allergic to morphine and related.  MEDICATIONS:  Scheduled Meds: . dexamethasone  4 mg Intravenous 4 times per day  . DULoxetine  60 mg Oral QHS  . feeding supplement (ENSURE COMPLETE)  237 mL Oral BID BM  . gabapentin  1,200 mg Oral TID  . levothyroxine  75 mcg Oral Once per day on Sun Tue Wed Fri Sat  . simvastatin  40 mg Oral Daily  . topiramate  100 mg Oral QHS   Continuous Infusions: . sodium chloride 1,000 mL (08/27/13 2218)  . heparin 1,100 Units/hr (08/29/13 0249)   PRN Meds:.acetaminophen, diazepam, HYDROmorphone (DILAUDID) injection, ondansetron (ZOFRAN) IV, oxyCODONE-acetaminophen  REVIEW OF SYSTEMS:   Constitutional: Denies fevers, chills or abnormal night sweats Eyes: Denies blurriness of vision, double vision or watery eyes Ears, nose, mouth, throat, and face: Denies mucositis or sore throat Respiratory: Denies cough, dyspnea or wheezes Cardiovascular: Denies palpitation, chest discomfort or lower extremity swelling Gastrointestinal:  Denies nausea, heartburn or change in bowel habits Skin: Denies abnormal skin rashes Lymphatics: Denies new lymphadenopathy or easy bruising Breasts as  Per HPI Neurological: As per HPI Behavioral/Psych: Mood is anxious, no new changes  All other  systems were reviewed with the patient and are negative.  PHYSICAL EXAMINATION: ECOG PERFORMANCE STATUS:   Filed Vitals:   08/29/13 0744  BP: 141/84  Pulse: 56  Temp: 97.6 F (36.4 C)  Resp: 12   Filed Weights   08/27/13 2029 08/28/13 0425 08/29/13 0500  Weight: 150 lb (68.04 kg) 150 lb 2.1 oz (68.1 kg) 142 lb (64.411 kg)    GENERAL:alert,uncomfortable due to pain, tearful SKIN: skin color, texture, turgor are normal, no rashes or significant lesions EYES: normal, conjunctiva are pink and non-injected, sclera clear OROPHARYNX: no exudate, no erythema and lips, buccal mucosa, and tongue normal  NECK: supple, thyroid normal size, non-tender, cannot appreciate nodularity LYMPH:  no palpable lymphadenopathy in the cervical, or inguinal. Cannot appreciate the supraclavicular adenopathy mentioned on imaging studies. Right axillary/chest wall small adenopathy palpable, shotty.  Breasts: large 7 x 7 x 3-4 cm mass at the right outer portion of the periareolar area, with nipple inversion and p'eau d'orange, without appreciable discharge. Non tender, firm.  No there masses noted in either breast.  LUNGS: clear to auscultation and percussion with normal breathing effort HEART: regular rate & rhythm and no murmurs and no lower extremity edema ABDOMEN:abdomen soft, non-tender and normal bowel sounds Musculoskeletal:no cyanosis of digits and no clubbing  PSYCH: alert & oriented x 3 with fluent speech NEURO: both lower extremities with decreased strength, 4/5 in left, 3/5 in right. Upper extremities normal. Non reproducible lumbar spine pain.  LABORATORY DATA:  I have reviewed the data as listed Lab Results  Component Value Date   WBC 8.6 08/29/2013   HGB 10.4* 08/29/2013   HCT 33.3* 08/29/2013   MCV 92.8 08/29/2013   PLT 378 08/29/2013   Lab Results  Component Value Date   NA 139 08/29/2013   K 4.0 08/29/2013   CL 100 08/29/2013   CO2 22 08/29/2013    RADIOGRAPHIC STUDIES:  Ct Head Wo  Contrast  08/28/2013   CLINICAL DATA:  The encephalopathy.  EXAM: CT HEAD WITHOUT CONTRAST  TECHNIQUE: Contiguous axial images were obtained from the base of the skull through the vertex without intravenous contrast.  COMPARISON:  None.  FINDINGS: Skull and Sinuses:There are ill-defined lytic lesions throughout the calvarium consistent with metastatic disease. The most notable lesions are in the right parietal and central occipital-parietal region measuring up to 3.5 cm in diameter. These lesions errode through the inner table, including at the level of the superior sagittal sinus.  Orbits: No acute abnormality.  Brain: No evidence of acute abnormality, such as acute infarction, hemorrhage, hydrocephalus, or mass lesion/mass effect. Apparent low-density in the medial right temporal lobe was evaluated on coronal reformats. This is in a region of streak artifact, the most likely cause. There is no mass effect in this region. No evidence of low-density along the cingulate gyrus or insula.  IMPRESSION: 1. No acute intracranial findings. 2. Multi focal calvarial metastatic disease. There is multi focal erosion through the inner table, including over the superior sagittal sinus. Suggest enhanced MRI followup.   Electronically Signed   By: Jorje Guild M.D.   On: 08/28/2013 02:21   Ct Chest W Contrast  08/29/2013    COMPARISON:  None.  FINDINGS: CT CHEST FINDINGS  THORACIC INLET/BODY WALL:  There is asymmetric, spiculated sub areolar tissue on the right. There is right axillary and deep pectoral lymphadenopathy with cortical thickening up to 1 cm. There is a 7 mm right supraclavicular lymph node which is moderately suspicious.  Bilateral thyroid nodules with coarse calcification, measuring up to 2 cm on the right. This is likely incidental given the patient's overall clinical status.  MEDIASTINUM:  Normal heart size. No pericardial effusion. There is residual thymus noted. Filling present within segmental arteries  of the right lower lobe. No lymphadenopathy.  LUNG WINDOWS:  No definitive metastasis. Small bilateral pleural effusions with dependent atelectasis.  OSSEOUS:  See below  CT ABDOMEN AND PELVIS FINDINGS  BODY WALL: Unremarkable.  Liver: There is a 5 cm mass in segment 6 which has peripheral nodular enhancement and centripetal fill-in. There is a 5 mm and 7 mm low-density nodules within the right hepatic lobe which may enhance on delayed imaging.  Biliary: Prominent intrahepatic biliary tree without visible cause.  Pancreas: Prominent diameter of the main pancreatic duct, measuring 4 mm. There is no visible mass to cause obstruction.  Spleen: Unremarkable.  Adrenals: Unremarkable.  Kidneys and ureters: 4 mm nonobstructive stone in the interpolar left kidney.  Bladder: Focal thickening and enhancement along the right bladder base, measuring up to 7 mm in thickness. There is no excretory contrast to suggest layering hyperdense urine.  Reproductive: Hysterectomy.  Probable salpingo oophorectomies.  Bowel: Possible constipation. No bowel obstruction. No evidence of bowel inflammation.  Retroperitoneum: No mass or adenopathy.  Peritoneum: No ascites or pneumoperitoneum.  Vascular: No acute abnormality.  OSSEOUS: There is diffuse osteolytic and sclerotic bone metastases.  Notable deposits:  1. Extensive demineralization of the right pelvis, centered  on the acetabulum, with extra osseous growth. No evidence of acute fracture. 2. 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 There is extra osseous tumor at the level of the left pedicle, which infiltrates the canal and left foramen. 3. L1 compression fracture with height loss approximately 25%. 4. Multiple spinal metastases likely have extra osseous tumor extension, including T5 with perispinal growth on the right. The T5 body is mildly compressed on the right, without discrete fracture line.   Critical Value/emergent results were called by telephone at the time of interpretation on 08/29/2013 at 1:24 am to [place], who verbally acknowledged these results.  IMPRESSION: 1. Segmental pulmonary embolism to the right lower lobe. 2. Findings consistent with right breast carcinoma with axillary/pectoral nodal spread and diffuse osseous metastatic disease. 3. L1 and L3 pathologic compression fractures. Retropulsion and extraosseous tumor at L3 causes advanced spinal canal and left foraminal stenosis. 4. Extensive demineralization of the right acetabulum, at significant risk for pathologic fracture. 5. 5 cm hemangioma in the right liver. Two tiny low densities in the liver which can be followed by imaging. 6. Focal urothelial thickening in the right bladder, a possible second malignancy. 7. Borderline enlarged right supraclavicular lymph node.   Electronically Signed   By: Jorje Guild M.D.   On: 08/29/2013 01:31   Ct Abdomen Pelvis W Contrast  08/29/2013    COMPARISON:  None.  FINDINGS: CT CHEST FINDINGS  THORACIC INLET/BODY WALL:  There is asymmetric, spiculated sub areolar tissue on the right. There is right axillary and deep pectoral lymphadenopathy with cortical thickening up to 1 cm. There is a 7 mm right supraclavicular lymph node which is moderately suspicious.  Bilateral thyroid nodules with coarse calcification, measuring up to 2 cm on the right. This is likely incidental given the patient's overall clinical status.  MEDIASTINUM:  Normal heart size. No pericardial effusion. There is residual thymus noted. Filling present within segmental arteries of the right lower lobe. No lymphadenopathy.  LUNG WINDOWS:  No definitive metastasis. Small bilateral pleural effusions with dependent atelectasis.  OSSEOUS:  See below  CT ABDOMEN AND PELVIS FINDINGS  BODY WALL: Unremarkable.  Liver: There is a 5 cm mass in segment 6 which has peripheral nodular enhancement and centripetal fill-in. There is a 5 mm and  7 mm low-density nodules within the right hepatic lobe which may enhance on delayed imaging.  Biliary: Prominent intrahepatic biliary tree without visible cause.  Pancreas: Prominent diameter of the main pancreatic duct, measuring 4 mm. There is no visible mass to cause obstruction.  Spleen: Unremarkable.  Adrenals: Unremarkable.  Kidneys and ureters: 4 mm nonobstructive stone in the interpolar left kidney.  Bladder: Focal thickening and enhancement along the right bladder base, measuring up to 7 mm in thickness. There is no excretory contrast to suggest layering hyperdense urine.  Reproductive: Hysterectomy.  Probable salpingo oophorectomies.  Bowel: Possible constipation. No bowel obstruction. No evidence of bowel inflammation.  Retroperitoneum: No mass or adenopathy.  Peritoneum: No ascites or pneumoperitoneum.  Vascular: No acute abnormality.  OSSEOUS: There is diffuse osteolytic and sclerotic bone metastases.  Notable deposits:  1. Extensive demineralization of the right pelvis, centered on the acetabulum, with extra osseous growth. No evidence of acute fracture. 2. 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 There is extra osseous tumor at the level of the left pedicle, which infiltrates the  canal and left foramen. 3. L1 compression fracture with height loss approximately 25%. 4. Multiple spinal metastases likely have extra osseous tumor extension, including T5 with perispinal growth on the right. The T5 body is mildly compressed on the right, without discrete fracture line.  Critical Value/emergent results were called by telephone at the time of interpretation on 08/29/2013 at 1:24 am to [place], who verbally acknowledged these results.  IMPRESSION: 1. Segmental pulmonary embolism to the right lower lobe. 2. Findings consistent with right breast carcinoma with axillary/pectoral nodal spread and diffuse osseous metastatic  disease. 3. L1 and L3 pathologic compression fractures. Retropulsion and extraosseous tumor at L3 causes advanced spinal canal and left foraminal stenosis. 4. Extensive demineralization of the right acetabulum, at significant risk for pathologic fracture. 5. 5 cm hemangioma in the right liver. Two tiny low densities in the liver which can be followed by imaging. 6. Focal urothelial thickening in the right bladder, a possible second malignancy. 7. Borderline enlarged right supraclavicular lymph node.   Electronically Signed   By: Jorje Guild M.D.   On: 08/29/2013 01:31   Dg Chest Port 1 View  08/27/2013   CLINICAL DATA:  Fever.  Recent diagnosis of cancer  EXAM: PORTABLE CHEST - 1 VIEW  COMPARISON:  None.  FINDINGS: Small streaky opacities in the lower lungs. No definitive pneumonia. No edema, effusion, or pneumothorax. There is pulmonary hyperinflation suggesting COPD. Normal heart size and upper mediastinal contours when accounting for distortion from rightward rotation.  IMPRESSION: COPD and mild basilar atelectasis. No definitive pneumonia to explain fever.   Electronically Signed   By: Jorje Guild M.D.   On: 08/27/2013 21:49    ASSESSMENT/PLAN:   1. Suspected Metastatic Breast Cancer  throught  the spine, right acetabulum causing cord compression, and to the brain, axillary, supraclavicular lymph nodes, and possible thyroid and liver Appreciate Radiation Oncology evaluation. Per chart note, she may require dorsal laminectomy with stabilization of L3 followed by adjuvant radiotherapy. With regard to her right acetabulum, she may also ultimately require right total hip placement in the near future MRI of the brain is pending, she may require radiation of brain as well.  Patient has not had a mammogram in several years, but right breast mass is indicative of primary site. Biopsy, for definite diagnosis and treatment options is recommended. Consider diagnostic mammogram versus surgical  evaluation, with ER/PR/Her).  Will check Ca 27-29 and LDH  Continue Decadron as per admitting team  2. Mild Hypercalcemia On admission, her Ca levels were 11.5 in the setting of malignancy, now 10.6 Monitor closely.  If further increase noted, may need pamidronate while in hospital   3. Full Code May need palliative Care evaluation  Other medical issues as per admitting team   [facility], PA-C @T @ 8:06 AM

## 2013-08-29 NOTE — Progress Notes (Signed)
  Radiation Oncology         [phone]) [phone] ________________________________  Name: Kristie Jackson MRN: [id number]  Date: 08/27/2013  DOB: 1955-03-15  Telephone contact:  I received a message from Dr. Pablo Ledger to review this patient's CT scans.  She is a very nice 58 year old woman who has been suffering with right leg pain and presented with fever. CT imaging appears to show widespread osseous metastatic disease with complete destruction of the right acetabulum, a S2 sacral metastasis, L3 compression fracture, L1 metastasis and other widespread metastatic disease. Per report, she remains ambulatory. I believe MRI is pending to further characterize the potential for spinal cord compression or nerve root compression within the lumbar spine requiring neurosurgical decompression. Ultimately, she may require dorsal laminectomy with stabilization of L3 followed by adjuvant radiotherapy. With regard to her right acetabulum, she may also ultimately require right total hip placement in the near future. I will plan to follow her progress and offered to initiate radiotherapy as soon as it is clinically warranted.  I agree with pending MRI is and neurosurgical consultation following completion of the lumbar MRI. She may also benefit from an orthopedic consultation with regard to the right hip.  Although this patient does have widespread metastatic disease, if she is alternately found to have breast cancer, she may have several excellent treatment options to provide prolonged stabilization of disease. Accordingly, aggressive supportive measures are certainly warranted.  ________________________________  Sheral Apley. Tammi Klippel, M.D.

## 2013-08-29 NOTE — Progress Notes (Signed)
Pt says she wasn't to wait on Dr. Sherral Hammers this morning to verify why she is getting another MRI of her spine when she recently had one done in [place] last month. She says she handed a copy of her results in to Dr. Sherral Hammers and has not had any changes after that beyond to fever to encourage anther MRI of her spin. It was to her understanding that she was getting and MRI of her head and chest only. MRI was informed of the Pt's request. Her heart rate had also decreased between 44-65, initially not sustaining in the 40's. This was noted after the dilaudid and percocet were given prior to CT scan. The on call NP was notified and informed. Her anxiety then began to increase  hours after returning to the unit and according to her "thinking of what was ahead of me makes me anxious". Her heart rate was back in the 60's, so valium was given. Heart rate decreased again in to the 50's, she began to c/o pain and MRI was calling to take her for her scan. Pain was rated at an 8/10 with heart rate b/w 50-low 60's irregular. One tablet of percocet was given. Then her heart rate decreased b/w 46-50's again. NP was informed again.

## 2013-08-29 NOTE — Progress Notes (Addendum)
  ADDENDUM: Patient seen and examined by me and  agree with above findings.  Patient is a 66 year with probable metastatic breast cancer based on imaging  (the spine, right acetabulum with cord compression, to the brain, axillary, supraclavicular LN, liver) admitted with right leg pain and fever.  Radiation oncology (Dr. Tammi Klippel) consulted and recommended neurosurgery evaluation.  Patient will require biopsy for tissue diagnosis and ER/PR/HER 2 testing. Heparin gtt for PE with transition to lovenox s/p procedure.  We will make more specific recommendations once tissue biopsy results obtained.  We will follow this patient with you.   I personally saw this patient and performed a substantive portion of this encounter with the listed APP documented above.   Robertta Halfhill, MD

## 2013-08-29 NOTE — Progress Notes (Addendum)
ANTICOAGULATION CONSULT NOTE - Initial Consult  Pharmacy Consult for heparin Indication: pulmonary embolus  Allergies  Allergen Reactions  . Morphine And Related Nausea And Vomiting    Patient Measurements: Height: 5\' 8"  (172.7 cm) Weight: 150 lb 2.1 oz (68.1 kg) IBW/kg (Calculated) : 63.9  Vital Signs: Temp: 97.3 F (36.3 C) (07/17 0000) Temp src: Oral (07/17 0000) BP: 114/71 mmHg (07/17 0000) Pulse Rate: 60 (07/17 0000)  Labs:  Recent Labs  08/27/13 2035 08/28/13 0039  HGB 11.4* 10.6*  HCT 35.7* 34.0*  PLT 401* 352  CREATININE 0.65 0.62    Estimated Creatinine Clearance: 78.3 ml/min (by C-G formula based on Cr of 0.62).   Medical History: Past Medical History  Diagnosis Date  . Central hypothyroidism   . Hyperlipidemia   . Spinal cord injury   . Depression   . Cancer     spinal metastasis    Medications:  Prescriptions prior to admission  Medication Sig Dispense Refill  . acetaminophen (TYLENOL) 500 MG tablet Take 1,000 mg by mouth every 6 (six) hours as needed for moderate pain.      . diazepam (VALIUM) 10 MG tablet Take 10 mg by mouth every 6 (six) hours as needed. For muscle spasms      . DULoxetine (CYMBALTA) 60 MG capsule Take 60 mg by mouth at bedtime.      . gabapentin (NEURONTIN) 600 MG tablet Take 1,200 mg by mouth 3 (three) times daily.      Marland Kitchen levothyroxine (SYNTHROID, LEVOTHROID) 75 MCG tablet Take 75 mcg by mouth See admin instructions. Takes daily except Monday and Thursday      . oxyCODONE-acetaminophen (PERCOCET/ROXICET) 5-325 MG per tablet Take 1-2 tablets by mouth every 6 (six) hours as needed for severe pain.  20 tablet  0  . rizatriptan (MAXALT) 10 MG tablet Take 10 mg by mouth as needed for migraine. May repeat in 2 hours if needed      . simvastatin (ZOCOR) 40 MG tablet Take 40 mg by mouth daily.      Marland Kitchen tiZANidine (ZANAFLEX) 4 MG tablet Take 8 mg by mouth 3 (three) times daily.      Marland Kitchen topiramate (TOPAMAX) 100 MG tablet Take 100 mg by  mouth at bedtime.       Scheduled:  . dexamethasone  4 mg Intravenous 4 times per day  . DULoxetine  60 mg Oral QHS  . feeding supplement (ENSURE COMPLETE)  237 mL Oral BID BM  . gabapentin  1,200 mg Oral TID  . heparin  5,000 Units Subcutaneous 3 times per day  . levothyroxine  75 mcg Oral Once per day on Sun Tue Wed Fri Sat  . simvastatin  40 mg Oral Daily  . topiramate  100 mg Oral QHS   Infusions:  . sodium chloride 1,000 mL (08/27/13 2218)  . sodium chloride 100 mL/hr at 08/29/13 0111    Assessment: 58yo female w/ recent Dx of metastatic Ca presented to ED w/ temp of 101.5 at home, denies other Sx, admitted 7/15 for concern for sepsis, now CT reveals segmental PE to RLL, to begin heparin.  Goal of Therapy:  Heparin level 0.3-0.7 units/ml Monitor platelets by anticoagulation protocol: Yes   Plan:  Rec'd SQ heparin at ~2200, will give heparin 2000 units IV bolus x1 followed by gtt at 1100 units/hr and monitor heparin levels and CBC.  Wynona Neat, PharmD, BCPS  08/29/2013,2:18 AM

## 2013-08-29 NOTE — Progress Notes (Signed)
Nurse just returned from taking pt to MRI. MRI technician stated pt would be down there for approximately 4 hrs. Notified central telemetry.

## 2013-08-29 NOTE — Progress Notes (Addendum)
ANTICOAGULATION CONSULT NOTE - Follow Up Consult   Pharmacy Consult for heparin Indication: pulmonary embolus  Allergies  Allergen Reactions  . Morphine And Related Nausea And Vomiting    Patient Measurements: Height: 5\' 8"  (172.7 cm) Weight: 142 lb (64.411 kg) IBW/kg (Calculated) : 63.9  Vital Signs: Temp: 97.6 F (36.4 C) (07/17 0744) Temp src: Oral (07/17 0744) BP: 141/84 mmHg (07/17 0744) Pulse Rate: 56 (07/17 0744)  Labs:  Recent Labs  08/27/13 2035 08/28/13 0039 08/29/13 0350 08/29/13 0920  HGB 11.4* 10.6* 10.4*  --   HCT 35.7* 34.0* 33.3*  --   PLT 401* 352 378  --   HEPARINUNFRC  --   --   --  0.52  CREATININE 0.65 0.62 0.40*  --     Estimated Creatinine Clearance: 78.3 ml/min (by C-G formula based on Cr of 0.4).   Medications:  Scheduled:  . dexamethasone  4 mg Intravenous 4 times per day  . DULoxetine  60 mg Oral QHS  . feeding supplement (ENSURE COMPLETE)  237 mL Oral BID BM  . gabapentin  1,200 mg Oral TID  . levothyroxine  75 mcg Oral Once per day on Sun Tue Wed Fri Sat  . simvastatin  40 mg Oral Daily  . topiramate  100 mg Oral QHS   Infusions:  . sodium chloride 1,000 mL (08/27/13 2218)  . heparin 1,100 Units/hr (08/29/13 0249)    Assessment: 58 yo f with a significant past medical history consisting of spinal cord injury with semi-functional paraplegia.  Patient presented to ED on 7/15 with a temperature of 101.5 at home. Patient admitted for concerns of sepsis and encephalopathy. Patient also has recent diagnosis of metastatic cancer. CT revealed PE to RLL.  Pharmacy is consulted to dose heparin.  Hgb 10.4, platelets 378 HL today 0.52  Goal of Therapy:  Heparin level 0.3-0.7 units/ml Monitor platelets by anticoagulation protocol: Yes   Plan:  HL in therapeutic range Continue heparin infusion at 1100 units/hr Repeat HL ordered for 1630 Continue to monitor H&H and platelets  Cassie L. Nicole Kindred, PharmD Clinical Pharmacy  Resident Pager: [phone] 08/29/2013 10:34 AM  Addendum: Recheck heparin level (0.68) within therapeutic range.   Plan: Continue heparin infusion at current rate. F/u AM labs.

## 2013-08-29 NOTE — Progress Notes (Signed)
  Radiation Oncology         [phone]) [phone] ________________________________  Name: Kristie Jackson  MRN: [id number]  Date: 08/27/2013  DOB: 06/16/1955  Chart Note:  Appreciate Dr. Clarice Pole consultation.  Since there is not a role for neurosurgical intervention, the patient will require biopsy of one of her sites of involvement to confirm malignancy and histology (general surgery? Interventional radiology?).    Once malignancy is confirmed, she should be transferred to [facility] in anticipation of palliative radiotherapy to multiple areas.  ________________________________  Sheral Apley. Tammi Klippel, M.D.

## 2013-08-29 NOTE — Consult Note (Signed)
Reason for Consult: Back pain leg weakness metastatic cancer Referring Physician: Dr. Lavone Orn  Kristie Jackson is an 58 y.o. female.  HPI: Patient is a 58 year old right-handed lady who tells me that she's been having problems of worsening weakness for several months time. She notes that she hasn't walked in at least 2 months. She also tells me that she had a history of the central cord injury about 20 years ago. She made a full recovery from that. She tells me that she's been trying to get help with her leg weakness and ultimately was seen by a neurologist at [facility] and subsequently she's developed fever. This prompted this admission. CT scans of the lumbar spine revealed destructive lesions at L3 and evidence for other metastatic disease at lumbar vertebrae. She's been having substantial problems with back pain. An MRI of the brain cervical thoracic and lumbar spines have been performed.  The scans demonstrate that the patient has multiple metastases in the calvarium. The C-spine has some lower cervical involvement of the vertebrae without any collapse. The thoracic spine has multiple vertebral involvement. There is slight collapse of the T5 vertebrae. In the lumbar spine there is partial collapse of the L3 vertebrae with soft tissue tumor on the left side causing a mild to moderate degree of stenosis. There is extensive tumor in multiple vertebral bodies.  The patient notes that she has a mass in her breast and it is suspected that she has primary breast carcinoma thought this point I do not believe a tissue diagnosis has been made. The findings on the scans are very consistent with metastatic breast carcinoma.  Past Medical History  Diagnosis Date  . Central hypothyroidism   . Hyperlipidemia   . Spinal cord injury   . Depression   . Cancer     spinal metastasis    Past Surgical History  Procedure Laterality Date  . Laminectomy    . Abdominal hysterectomy      No family history on  file.  Social History:  reports that she has been smoking Cigarettes.  She has been smoking about 1.00 pack per day. She does not have any smokeless tobacco history on file. She reports that she drinks alcohol. She reports that she does not use illicit drugs.  Allergies:  Allergies  Allergen Reactions  . Morphine And Related Nausea And Vomiting    Medications: I have reviewed the patient's current medications.  Results for orders placed during the hospital encounter of 08/27/13 (from the past 48 hour(s))  CBC WITH DIFFERENTIAL     Status: Abnormal   Collection Time    08/27/13  8:35 PM      Result Value Ref Range   WBC 11.6 (*) 4.0 - 10.5 K/uL   RBC 3.85 (*) 3.87 - 5.11 MIL/uL   Hemoglobin 11.4 (*) 12.0 - 15.0 g/dL   HCT 35.7 (*) 36.0 - 46.0 %   MCV 92.7  78.0 - 100.0 fL   MCH 29.6  26.0 - 34.0 pg   MCHC 31.9  30.0 - 36.0 g/dL   RDW 14.1  11.5 - 15.5 %   Platelets 401 (*) 150 - 400 K/uL   Neutrophils Relative % 49  43 - 77 %   Neutro Abs 5.7  1.7 - 7.7 K/uL   Lymphocytes Relative 40  12 - 46 %   Lymphs Abs 4.7 (*) 0.7 - 4.0 K/uL   Monocytes Relative 10  3 - 12 %   Monocytes Absolute 1.2 (*) 0.1 -  1.0 K/uL   Eosinophils Relative 1  0 - 5 %   Eosinophils Absolute 0.1  0.0 - 0.7 K/uL   Basophils Relative 0  0 - 1 %   Basophils Absolute 0.0  0.0 - 0.1 K/uL  LIPASE, BLOOD     Status: Abnormal   Collection Time    08/27/13  8:35 PM      Result Value Ref Range   Lipase 10 (*) 11 - 59 U/L  COMPREHENSIVE METABOLIC PANEL     Status: Abnormal   Collection Time    08/27/13  8:35 PM      Result Value Ref Range   Sodium 139  137 - 147 mEq/L   Potassium 4.2  3.7 - 5.3 mEq/L   Chloride 98  96 - 112 mEq/L   CO2 26  19 - 32 mEq/L   Glucose, Bld 100 (*) 70 - 99 mg/dL   BUN 16  6 - 23 mg/dL   Creatinine, Ser 0.65  0.50 - 1.10 mg/dL   Calcium 11.5 (*) 8.4 - 10.5 mg/dL   Total Protein 7.0  6.0 - 8.3 g/dL   Albumin 2.7 (*) 3.5 - 5.2 g/dL   AST 21  0 - 37 U/L   ALT 18  0 - 35 U/L    Alkaline Phosphatase 135 (*) 39 - 117 U/L   Total Bilirubin 0.2 (*) 0.3 - 1.2 mg/dL   GFR calc non Af Amer >90  >90 mL/min   GFR calc Af Amer >90  >90 mL/min   Comment: (NOTE)     The eGFR has been calculated using the CKD EPI equation.     This calculation has not been validated in all clinical situations.     eGFR's persistently <90 mL/min signify possible Chronic Kidney     Disease.   Anion gap 15  5 - 15  CULTURE, BLOOD (ROUTINE X 2)     Status: None   Collection Time    08/27/13  9:30 PM      Result Value Ref Range   Specimen Description BLOOD ARM RIGHT     Special Requests BOTTLES DRAWN AEROBIC AND ANAEROBIC 10CC     Culture  Setup Time       Value: 08/28/2013 04:08     Performed at [facility]   Culture       Value:        BLOOD CULTURE RECEIVED NO GROWTH TO DATE CULTURE WILL BE HELD FOR 5 DAYS BEFORE ISSUING A FINAL NEGATIVE REPORT     Performed at [facility]   Report Status PENDING    CULTURE, BLOOD (ROUTINE X 2)     Status: None   Collection Time    08/27/13  9:35 PM      Result Value Ref Range   Specimen Description BLOOD HAND RIGHT     Special Requests BOTTLES DRAWN AEROBIC ONLY 10CC     Culture  Setup Time       Value: 08/28/2013 04:09     Performed at [facility]   Culture       Value:        BLOOD CULTURE RECEIVED NO GROWTH TO DATE CULTURE WILL BE HELD FOR 5 DAYS BEFORE ISSUING A FINAL NEGATIVE REPORT     Performed at [facility]   Report Status PENDING    URINALYSIS, ROUTINE W REFLEX MICROSCOPIC     Status: Abnormal   Collection Time    08/27/13  10:05 PM      Result Value Ref Range   Color, Urine YELLOW  YELLOW   APPearance HAZY (*) CLEAR   Specific Gravity, Urine 1.019  1.005 - 1.030   pH 6.0  5.0 - 8.0   Glucose, UA NEGATIVE  NEGATIVE mg/dL   Hgb urine dipstick NEGATIVE  NEGATIVE   Bilirubin Urine NEGATIVE  NEGATIVE   Ketones, ur NEGATIVE  NEGATIVE mg/dL   Protein, ur NEGATIVE  NEGATIVE mg/dL   Urobilinogen, UA  0.2  0.0 - 1.0 mg/dL   Nitrite NEGATIVE  NEGATIVE   Leukocytes, UA NEGATIVE  NEGATIVE   Comment: MICROSCOPIC NOT DONE ON URINES WITH NEGATIVE PROTEIN, BLOOD, LEUKOCYTES, NITRITE, OR GLUCOSE <1000 mg/dL.  URINE RAPID DRUG SCREEN (HOSP PERFORMED)     Status: Abnormal   Collection Time    08/27/13 10:05 PM      Result Value Ref Range   Opiates NONE DETECTED  NONE DETECTED   Cocaine NONE DETECTED  NONE DETECTED   Benzodiazepines POSITIVE (*) NONE DETECTED   Amphetamines NONE DETECTED  NONE DETECTED   Tetrahydrocannabinol POSITIVE (*) NONE DETECTED   Barbiturates NONE DETECTED  NONE DETECTED   Comment:            DRUG SCREEN FOR MEDICAL PURPOSES     ONLY.  IF CONFIRMATION IS NEEDED     FOR ANY PURPOSE, NOTIFY LAB     WITHIN 5 DAYS.                LOWEST DETECTABLE LIMITS     FOR URINE DRUG SCREEN     Drug Class       Cutoff (ng/mL)     Amphetamine      1000     Barbiturate      200     Benzodiazepine   048     Tricyclics       889     Opiates          300     Cocaine          300     THC              50  URINE CULTURE     Status: None   Collection Time    08/27/13 10:06 PM      Result Value Ref Range   Specimen Description URINE, CATHETERIZED     Special Requests ADDED [id number]     Culture  Setup Time       Value: 08/28/2013 04:08     Performed at [facility]       Value: NO GROWTH     Performed at [facility]   Culture       Value: NO GROWTH     Performed at [facility]   Report Status 08/29/2013 FINAL    ETHANOL     Status: None   Collection Time    08/27/13 10:14 PM      Result Value Ref Range   Alcohol, Ethyl (B) <11  0 - 11 mg/dL   Comment:            LOWEST DETECTABLE LIMIT FOR     SERUM ALCOHOL IS 11 mg/dL     FOR MEDICAL PURPOSES ONLY  I-STAT CG4 LACTIC ACID, ED     Status: None   Collection Time    08/27/13 10:26 PM      Result Value Ref  Range   Lactic Acid, Venous 0.73  0.5 - 2.2 mmol/L  COMPREHENSIVE  METABOLIC PANEL     Status: Abnormal   Collection Time    08/28/13 12:39 AM      Result Value Ref Range   Sodium 142  137 - 147 mEq/L   Potassium 3.8  3.7 - 5.3 mEq/L   Chloride 104  96 - 112 mEq/L   CO2 25  19 - 32 mEq/L   Glucose, Bld 108 (*) 70 - 99 mg/dL   BUN 15  6 - 23 mg/dL   Creatinine, Ser 0.62  0.50 - 1.10 mg/dL   Calcium 10.6 (*) 8.4 - 10.5 mg/dL   Total Protein 6.4  6.0 - 8.3 g/dL   Albumin 2.4 (*) 3.5 - 5.2 g/dL   AST 19  0 - 37 U/L   ALT 17  0 - 35 U/L   Alkaline Phosphatase 122 (*) 39 - 117 U/L   Total Bilirubin 0.2 (*) 0.3 - 1.2 mg/dL   GFR calc non Af Amer >90  >90 mL/min   GFR calc Af Amer >90  >90 mL/min   Comment: (NOTE)     The eGFR has been calculated using the CKD EPI equation.     This calculation has not been validated in all clinical situations.     eGFR's persistently <90 mL/min signify possible Chronic Kidney     Disease.   Anion gap 13  5 - 15  CBC WITH DIFFERENTIAL     Status: Abnormal   Collection Time    08/28/13 12:39 AM      Result Value Ref Range   WBC 10.3  4.0 - 10.5 K/uL   RBC 3.61 (*) 3.87 - 5.11 MIL/uL   Hemoglobin 10.6 (*) 12.0 - 15.0 g/dL   HCT 34.0 (*) 36.0 - 46.0 %   MCV 94.2  78.0 - 100.0 fL   MCH 29.4  26.0 - 34.0 pg   MCHC 31.2  30.0 - 36.0 g/dL   RDW 14.0  11.5 - 15.5 %   Platelets 352  150 - 400 K/uL   Neutrophils Relative % 47  43 - 77 %   Neutro Abs 4.8  1.7 - 7.7 K/uL   Lymphocytes Relative 43  12 - 46 %   Lymphs Abs 4.5 (*) 0.7 - 4.0 K/uL   Monocytes Relative 9  3 - 12 %   Monocytes Absolute 0.9  0.1 - 1.0 K/uL   Eosinophils Relative 1  0 - 5 %   Eosinophils Absolute 0.1  0.0 - 0.7 K/uL   Basophils Relative 0  0 - 1 %   Basophils Absolute 0.0  0.0 - 0.1 K/uL  PROCALCITONIN     Status: None   Collection Time    08/28/13 12:39 AM      Result Value Ref Range   Procalcitonin <0.10     Comment:            Interpretation:     PCT (Procalcitonin) <= 0.5 ng/mL:     Systemic infection (sepsis) is not likely.      Local bacterial infection is possible.     (NOTE)             ICU PCT Algorithm               Non ICU PCT Algorithm        ----------------------------     ------------------------------  PCT < 0.25 ng/mL                 PCT < 0.1 ng/mL         Stopping of antibiotics            Stopping of antibiotics           strongly encouraged.               strongly encouraged.        ----------------------------     ------------------------------           PCT level decrease by               PCT < 0.25 ng/mL           >= 80% from peak PCT           OR PCT 0.25 - 0.5 ng/mL          Stopping of antibiotics                                                 encouraged.         Stopping of antibiotics               encouraged.        ----------------------------     ------------------------------           PCT level decrease by              PCT >= 0.25 ng/mL           < 80% from peak PCT            AND PCT >= 0.5 ng/mL            Continuing antibiotics                                                  encouraged.           Continuing antibiotics                encouraged.        ----------------------------     ------------------------------         PCT level increase compared          PCT > 0.5 ng/mL             with peak PCT AND              PCT >= 0.5 ng/mL             Escalation of antibiotics                                              strongly encouraged.          Escalation of antibiotics            strongly encouraged.  AMMONIA     Status: None   Collection Time    08/28/13 12:39 AM      Result Value Ref Range   Ammonia 22  11 - 60 umol/L  MRSA PCR SCREENING     Status: None   Collection Time    08/28/13  1:55 AM      Result Value Ref Range   MRSA by PCR NEGATIVE  NEGATIVE   Comment:            The GeneXpert MRSA Assay (FDA     approved for NASAL specimens     only), is one component of a     comprehensive MRSA colonization     surveillance program. It is not      intended to diagnose MRSA     infection nor to guide or     monitor treatment for     MRSA infections.  PTH, INTACT AND CALCIUM     Status: Abnormal   Collection Time    08/28/13 10:00 AM      Result Value Ref Range   PTH 10.6 (*) 14.0 - 72.0 pg/mL   Calcium, Total (PTH) 10.0  8.4 - 10.5 mg/dL   Comment: Performed at [place]     Status: Abnormal   Collection Time    08/29/13  3:50 AM      Result Value Ref Range   Sodium 139  137 - 147 mEq/L   Potassium 4.0  3.7 - 5.3 mEq/L   Chloride 100  96 - 112 mEq/L   CO2 22  19 - 32 mEq/L   Glucose, Bld 110 (*) 70 - 99 mg/dL   BUN 12  6 - 23 mg/dL   Creatinine, Ser 0.40 (*) 0.50 - 1.10 mg/dL   Comment: DELTA CHECK NOTED   Calcium 10.6 (*) 8.4 - 10.5 mg/dL   Total Protein 6.7  6.0 - 8.3 g/dL   Albumin 2.6 (*) 3.5 - 5.2 g/dL   AST 17  0 - 37 U/L   ALT 15  0 - 35 U/L   Alkaline Phosphatase 120 (*) 39 - 117 U/L   Total Bilirubin <0.2 (*) 0.3 - 1.2 mg/dL   GFR calc non Af Amer >90  >90 mL/min   GFR calc Af Amer >90  >90 mL/min   Comment: (NOTE)     The eGFR has been calculated using the CKD EPI equation.     This calculation has not been validated in all clinical situations.     eGFR's persistently <90 mL/min signify possible Chronic Kidney     Disease.   Anion gap 17 (*) 5 - 15  CBC WITH DIFFERENTIAL     Status: Abnormal   Collection Time    08/29/13  3:50 AM      Result Value Ref Range   WBC 8.6  4.0 - 10.5 K/uL   RBC 3.59 (*) 3.87 - 5.11 MIL/uL   Hemoglobin 10.4 (*) 12.0 - 15.0 g/dL   HCT 33.3 (*) 36.0 - 46.0 %   MCV 92.8  78.0 - 100.0 fL   MCH 29.0  26.0 - 34.0 pg   MCHC 31.2  30.0 - 36.0 g/dL   RDW 13.9  11.5 - 15.5 %   Platelets 378  150 - 400 K/uL   Neutrophils Relative % 78 (*) 43 - 77 %   Neutro Abs 6.7  1.7 - 7.7 K/uL   Lymphocytes Relative 21  12 - 46 %   Lymphs Abs 1.8  0.7 - 4.0 K/uL   Monocytes Relative 1 (*) 3 - 12 %   Monocytes Absolute 0.1  0.1 - 1.0 K/uL  Eosinophils Relative 0  0 - 5 %   Eosinophils Absolute 0.0  0.0 - 0.7 K/uL   Basophils Relative 0  0 - 1 %   Basophils Absolute 0.0  0.0 - 0.1 K/uL  HEPARIN LEVEL (UNFRACTIONATED)     Status: None   Collection Time    08/29/13  9:20 AM      Result Value Ref Range   Heparin Unfractionated 0.52  0.30 - 0.70 IU/mL   Comment:            IF HEPARIN RESULTS ARE BELOW     EXPECTED VALUES, AND PATIENT     DOSAGE HAS BEEN CONFIRMED,     SUGGEST FOLLOW UP TESTING     OF ANTITHROMBIN III LEVELS.  HEPARIN LEVEL (UNFRACTIONATED)     Status: None   Collection Time    08/29/13  4:55 PM      Result Value Ref Range   Heparin Unfractionated 0.68  0.30 - 0.70 IU/mL   Comment:            IF HEPARIN RESULTS ARE BELOW     EXPECTED VALUES, AND PATIENT     DOSAGE HAS BEEN CONFIRMED,     SUGGEST FOLLOW UP TESTING     OF ANTITHROMBIN III LEVELS.  LACTATE DEHYDROGENASE     Status: None   Collection Time    08/29/13  4:55 PM      Result Value Ref Range   LDH 184  94 - 250 U/L    Ct Head Wo Contrast  08/28/2013   CLINICAL DATA:  The encephalopathy.  EXAM: CT HEAD WITHOUT CONTRAST  TECHNIQUE: Contiguous axial images were obtained from the base of the skull through the vertex without intravenous contrast.  COMPARISON:  None.  FINDINGS: Skull and Sinuses:There are ill-defined lytic lesions throughout the calvarium consistent with metastatic disease. The most notable lesions are in the right parietal and central occipital-parietal region measuring up to 3.5 cm in diameter. These lesions errode through the inner table, including at the level of the superior sagittal sinus.  Orbits: No acute abnormality.  Brain: No evidence of acute abnormality, such as acute infarction, hemorrhage, hydrocephalus, or mass lesion/mass effect. Apparent low-density in the medial right temporal lobe was evaluated on coronal reformats. This is in a region of streak artifact, the most likely cause. There is no mass effect in this region.  No evidence of low-density along the cingulate gyrus or insula.  IMPRESSION: 1. No acute intracranial findings. 2. Multi focal calvarial metastatic disease. There is multi focal erosion through the inner table, including over the superior sagittal sinus. Suggest enhanced MRI followup.   Electronically Signed   By: Jorje Guild M.D.   On: 08/28/2013 02:21   Ct Chest W Contrast  08/29/2013   CLINICAL DATA:  Evaluate for source of malignancy.  EXAM: CT CHEST, ABDOMEN, AND PELVIS WITH CONTRAST  TECHNIQUE: Multidetector CT imaging of the chest, abdomen and pelvis was performed following the standard protocol during bolus administration of intravenous contrast.  CONTRAST:  71m OMNIPAQUE IOHEXOL 300 MG/ML  SOLN  COMPARISON:  None.  FINDINGS: CT CHEST FINDINGS  THORACIC INLET/BODY WALL:  There is asymmetric, spiculated sub areolar tissue on the right. There is right axillary and deep pectoral lymphadenopathy with cortical thickening up to 1 cm. There is a 7 mm right supraclavicular lymph node which is moderately suspicious.  Bilateral thyroid nodules with coarse calcification, measuring up to 2 cm on the right. This is likely incidental  given the patient's overall clinical status.  MEDIASTINUM:  Normal heart size. No pericardial effusion. There is residual thymus noted. Filling present within segmental arteries of the right lower lobe. No lymphadenopathy.  LUNG WINDOWS:  No definitive metastasis. Small bilateral pleural effusions with dependent atelectasis.  OSSEOUS:  See below  CT ABDOMEN AND PELVIS FINDINGS  BODY WALL: Unremarkable.  Liver: There is a 5 cm mass in segment 6 which has peripheral nodular enhancement and centripetal fill-in. There is a 5 mm and 7 mm low-density nodules within the right hepatic lobe which may enhance on delayed imaging.  Biliary: Prominent intrahepatic biliary tree without visible cause.  Pancreas: Prominent diameter of the main pancreatic duct, measuring 4 mm. There is no visible mass  to cause obstruction.  Spleen: Unremarkable.  Adrenals: Unremarkable.  Kidneys and ureters: 4 mm nonobstructive stone in the interpolar left kidney.  Bladder: Focal thickening and enhancement along the right bladder base, measuring up to 7 mm in thickness. There is no excretory contrast to suggest layering hyperdense urine.  Reproductive: Hysterectomy.  Probable salpingo oophorectomies.  Bowel: Possible constipation. No bowel obstruction. No evidence of bowel inflammation.  Retroperitoneum: No mass or adenopathy.  Peritoneum: No ascites or pneumoperitoneum.  Vascular: No acute abnormality.  OSSEOUS: There is diffuse osteolytic and sclerotic bone metastases.  Notable deposits:  1. Extensive demineralization of the right pelvis, centered on the acetabulum, with extra osseous growth. No evidence of acute fracture. 2. 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 There is extra osseous tumor at the level of the left pedicle, which infiltrates the canal and left foramen. 3. L1 compression fracture with height loss approximately 25%. 4. Multiple spinal metastases likely have extra osseous tumor extension, including T5 with perispinal growth on the right. The T5 body is mildly compressed on the right, without discrete fracture line.  Critical Value/emergent results were called by telephone at the time of interpretation on 08/29/2013 at 1:24 am to [place], who verbally acknowledged these results.  IMPRESSION: 1. Segmental pulmonary embolism to the right lower lobe. 2. Findings consistent with right breast carcinoma with axillary/pectoral nodal spread and diffuse osseous metastatic disease. 3. L1 and L3 pathologic compression fractures. Retropulsion and extraosseous tumor at L3 causes advanced spinal canal and left foraminal stenosis. 4. Extensive demineralization of the right acetabulum, at significant risk for pathologic fracture. 5. 5  cm hemangioma in the right liver. Two tiny low densities in the liver which can be followed by imaging. 6. Focal urothelial thickening in the right bladder, a possible second malignancy. 7. Borderline enlarged right supraclavicular lymph node.   Electronically Signed   By: Jorje Guild M.D.   On: 08/29/2013 01:31   Mr Jeri Cos PO Contrast  08/29/2013   CLINICAL DATA:  Extensive metastatic disease.  EXAM: MRI HEAD WITHOUT AND WITH CONTRAST  TECHNIQUE: Multiplanar, multiecho pulse sequences of the brain and surrounding structures were obtained without and with intravenous contrast.  CONTRAST:  49m MULTIHANCE GADOBENATE DIMEGLUMINE 529 MG/ML IV SOLN  COMPARISON:  CT head without contrast 08/28/2013  FINDINGS: Multiple skull metastases are again noted. The largest lesions are in the parietal bones and at the apex of the occipital bone just below the lambdoid suture. The lesion in the right parietal bone extends to the dura with some dural thickening. There PE occipital lesion also extends to the dura adjacent to the superior sagittal sinus. Sinus is patent.  No parenchymal lesions  are evident. No acute infarct, hemorrhage or parenchymal mass lesion is present. There is no significant white matter disease.  Flow is present in the major intracranial arteries. The globes and orbits are intact. The paranasal sinuses and mastoid air cells are clear.  IMPRESSION: 1. Extensive skull metastases. 2. At least 2 of these lesions extend to the dura with focal dural enhancement suggesting dural metastases or reactive change associated with the adjacent skull metastasis. 3. No evidence for parenchymal metastases. 4. The MRI appearance of the brain is normal for age.   Electronically Signed   By: Lawrence Santiago M.D.   On: 08/29/2013 13:55   Mr Cervical Spine W Wo Contrast  08/29/2013   CLINICAL DATA:  Metastatic disease to the bones.  EXAM: MRI CERVICAL SPINE WITHOUT AND WITH CONTRAST  TECHNIQUE: Multiplanar and multiecho  pulse sequences of the cervical spine, to include the craniocervical junction and cervicothoracic junction, were obtained according to standard protocol without and with intravenous contrast.  CONTRAST:  41m MULTIHANCE GADOBENATE DIMEGLUMINE 529 MG/ML IV SOLN  COMPARISON:  None.  FINDINGS: There are metastatic lesions involving every cervical vertebra as well as the clivus and the visualized portions of the occipital and parietal bones. There is a pathologic compression fracture of the anterior aspect of T1. There is no tumor protrusion into the spinal canal or neural foramina.  There is a small central disc protrusion at C5-6 which indents the ventral aspect of the spinal cord without myelopathy. This protrusion extends into the right neural foramen and osteophytes extend into the left neural foramen is could affect either or both C6 nerves.  Tumor is most prominent in the right lateral mass of C6 and in the left lateral mass of C7. There is edema in the posterior paraspinal musculature throughout the cervical spine.  The metastases enhance after contrast administration.  Lymphoid tissue is prominent at the base of the tongue.  There is a complex nodule in the right side of the thyroid gland.  The patient has had previous posterior decompression at C3-4.  There is a tiny focal area of abnormal signal in the left side of the spinal cord at that level which is most likely myelopathy secondary to the now relieved neural impingement.  IMPRESSION: 1. Osseous metastases in the visualized portions of the skull and at each level level of the cervical spine. No visible neural impingement by tumor at this time. 2. Pathologic fracture of the anterior aspect of T1. T1 is diffusely infiltrated by tumor. 3. No epidural tumor.   Electronically Signed   By: JRozetta NunneryM.D.   On: 08/29/2013 13:33   Mr Thoracic Spine W Wo Contrast  08/29/2013   CLINICAL DATA:  Diffuse osseous metastases from unknown primary tumor.  EXAM: MRI  THORACIC SPINE WITHOUT AND WITH CONTRAST  TECHNIQUE: Multiplanar and multiecho pulse sequences of the thoracic spine were obtained without and with intravenous contrast.  CONTRAST:  165mMULTIHANCE GADOBENATE DIMEGLUMINE 529 MG/ML IV SOLN  COMPARISON:  CT scan dated 08/28/2013  FINDINGS: There are metastatic lesions involving every vertebra in the thoracic spine. There is a pathologic compression fracture of the anterior aspect of T1. There is extensive tumor infiltration and T5, T7, T8, T10, T12, and L1.  Tumor extends into the left pedicle at T11 and slightly narrows the left neural foramen and slightly compresses the left posterior lateral aspect of the thecal sac but does not compress the spinal cord. There is a subtle pathologic compression fracture of the  right side of the superior endplate of T5. No protrusion of bone or tumor or disc material into the spinal canal. No other pathologic fractures no visible epidural tumor.  IMPRESSION: 1. Extensive metastatic disease throughout the thoracic spine with a subtle pathologic fracture of the superior aspect of T5. 2. No epidural tumor or neural impingement.   Electronically Signed   By: Rozetta Nunnery M.D.   On: 08/29/2013 13:57   Mr Lumbar Spine W Wo Contrast  08/29/2013   CLINICAL DATA:  Widespread metastatic disease to the bones.  EXAM: MRI LUMBAR SPINE WITHOUT AND WITH CONTRAST  TECHNIQUE: Multiplanar and multiecho pulse sequences of the lumbar spine were obtained without and with intravenous contrast.  CONTRAST:  4m MULTIHANCE GADOBENATE DIMEGLUMINE 529 MG/ML IV SOLN  COMPARISON:  CT scan dated 08/28/2013  FINDINGS: Conus tip is at the inferior aspect of L2.  Metastatic disease extensively involves the entire lumbar spine as well as the sacrum in the adjacent iliac bones. There is a pathologic compression fracture of the L3 vertebra with protrusion of the posterior aspect of the infiltrated vertebral body into the spinal canal compressing the left side of  the thecal sac in the left lateral recess. The tumor extends into the left pedicle, lamina, and facets and also extends into the left neural foramen at L3-4.  There is a slight compression deformity of the central portion of the L1 vertebra but there is no protrusion of bone or tumor or disc into the spinal canal at that level.  There is degenerative disc disease at L5-S1 with disc space narrowing and a 4 mm retrolisthesis with a small broad-based disc bulge with accompanying osteophytes. This touches both S1 nerves but does not compress them. There is fairly severe left foraminal stenosis at L5-S1.  There is diffuse edema in the paraspinal musculature.  After contrast administration there numerous metastases enhance.  IMPRESSION: 1. Diffuse metastatic disease throughout the spine with a compression fracture of L3. Infiltrated bone protrudes into the spinal canal and compresses the left lateral recess in the left neural foramen at L3-4. 2. Slight pathologic compression fracture of the central portion of L1 without neural impingement. 3. There is no definitive epidural tumor.   Electronically Signed   By: JRozetta NunneryM.D.   On: 08/29/2013 13:40   Ct Abdomen Pelvis W Contrast  08/29/2013   CLINICAL DATA:  Evaluate for source of malignancy.  EXAM: CT CHEST, ABDOMEN, AND PELVIS WITH CONTRAST  TECHNIQUE: Multidetector CT imaging of the chest, abdomen and pelvis was performed following the standard protocol during bolus administration of intravenous contrast.  CONTRAST:  859mOMNIPAQUE IOHEXOL 300 MG/ML  SOLN  COMPARISON:  None.  FINDINGS: CT CHEST FINDINGS  THORACIC INLET/BODY WALL:  There is asymmetric, spiculated sub areolar tissue on the right. There is right axillary and deep pectoral lymphadenopathy with cortical thickening up to 1 cm. There is a 7 mm right supraclavicular lymph node which is moderately suspicious.  Bilateral thyroid nodules with coarse calcification, measuring up to 2 cm on the right. This is  likely incidental given the patient's overall clinical status.  MEDIASTINUM:  Normal heart size. No pericardial effusion. There is residual thymus noted. Filling present within segmental arteries of the right lower lobe. No lymphadenopathy.  LUNG WINDOWS:  No definitive metastasis. Small bilateral pleural effusions with dependent atelectasis.  OSSEOUS:  See below  CT ABDOMEN AND PELVIS FINDINGS  BODY WALL: Unremarkable.  Liver: There is a 5 cm mass in segment 6  which has peripheral nodular enhancement and centripetal fill-in. There is a 5 mm and 7 mm low-density nodules within the right hepatic lobe which may enhance on delayed imaging.  Biliary: Prominent intrahepatic biliary tree without visible cause.  Pancreas: Prominent diameter of the main pancreatic duct, measuring 4 mm. There is no visible mass to cause obstruction.  Spleen: Unremarkable.  Adrenals: Unremarkable.  Kidneys and ureters: 4 mm nonobstructive stone in the interpolar left kidney.  Bladder: Focal thickening and enhancement along the right bladder base, measuring up to 7 mm in thickness. There is no excretory contrast to suggest layering hyperdense urine.  Reproductive: Hysterectomy.  Probable salpingo oophorectomies.  Bowel: Possible constipation. No bowel obstruction. No evidence of bowel inflammation.  Retroperitoneum: No mass or adenopathy.  Peritoneum: No ascites or pneumoperitoneum.  Vascular: No acute abnormality.  OSSEOUS: There is diffuse osteolytic and sclerotic bone metastases.  Notable deposits:  1. Extensive demineralization of the right pelvis, centered on the acetabulum, with extra osseous growth. No evidence of acute fracture. 2. 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 There is extra osseous tumor at the level of the left pedicle, which infiltrates the canal and left foramen. 3. L1 compression fracture with height loss approximately 25%. 4.  Multiple spinal metastases likely have extra osseous tumor extension, including T5 with perispinal growth on the right. The T5 body is mildly compressed on the right, without discrete fracture line.  Critical Value/emergent results were called by telephone at the time of interpretation on 08/29/2013 at 1:24 am to [place], who verbally acknowledged these results.  IMPRESSION: 1. Segmental pulmonary embolism to the right lower lobe. 2. Findings consistent with right breast carcinoma with axillary/pectoral nodal spread and diffuse osseous metastatic disease. 3. L1 and L3 pathologic compression fractures. Retropulsion and extraosseous tumor at L3 causes advanced spinal canal and left foraminal stenosis. 4. Extensive demineralization of the right acetabulum, at significant risk for pathologic fracture. 5. 5 cm hemangioma in the right liver. Two tiny low densities in the liver which can be followed by imaging. 6. Focal urothelial thickening in the right bladder, a possible second malignancy. 7. Borderline enlarged right supraclavicular lymph node.   Electronically Signed   By: Jorje Guild M.D.   On: 08/29/2013 01:31   Dg Chest Port 1 View  08/27/2013   CLINICAL DATA:  Fever.  Recent diagnosis of cancer  EXAM: PORTABLE CHEST - 1 VIEW  COMPARISON:  None.  FINDINGS: Small streaky opacities in the lower lungs. No definitive pneumonia. No edema, effusion, or pneumothorax. There is pulmonary hyperinflation suggesting COPD. Normal heart size and upper mediastinal contours when accounting for distortion from rightward rotation.  IMPRESSION: COPD and mild basilar atelectasis. No definitive pneumonia to explain fever.   Electronically Signed   By: Jorje Guild M.D.   On: 08/27/2013 21:49    Review of Systems  Constitutional: Positive for fever and chills.  HENT: Negative.   Eyes: Negative.   Respiratory: Negative.   Cardiovascular: Negative.   Musculoskeletal: Positive for back pain.  Skin: Negative.    Neurological: Positive for focal weakness and weakness.  Psychiatric/Behavioral: Positive for depression.   Blood pressure 102/65, pulse 73, temperature 97.7 F (36.5 C), temperature source Oral, resp. rate 16, height _0  (1.727 m), weight 64.411 kg (142 lb), SpO2 100.00%. Physical Exam  Constitutional: She appears well-developed and well-nourished.  Musculoskeletal:  Moderate back pain to palpation or percussion. Moderate amount of  dependent edema noted.  Neurological:  Trace movements of lower extremities with slight toe wiggle on the left. 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  Given the extensive disease that is present I do not believe that surgical intervention is warranted for this process at this time  I do not see compressive pathology that explains fully her profound paralysis of lower extremities. Certainly the fact that she had previously had a central cord injury makes her spinal cord more vulnerable however there is no compressive lesion of the spinal cord that is evident on the scans. The degree of stenosis in the lumbar spine would not cause a complete paralysis of her lower extremities.  This raises the possibility that her weakness is due to a paraneoplastic syndrome.  In any event I believe that radiation oncology and conservative treatment should be rendered to this patient at the current time. We will follow along with you showed some necessity for neurosurgery a rise.  Mykale Gandolfo J 08/29/2013, 8:24 PM

## 2013-08-29 NOTE — Progress Notes (Signed)
Subjective: Back and R leg pain, legs very weak  Objective: Vital signs in last 24 hours: Temp:  [97.3 F (36.3 C)-98.1 F (36.7 C)] 97.6 F (36.4 C) (07/17 0744) Pulse Rate:  [52-75] 56 (07/17 0744) Resp:  [9-17] 12 (07/17 0744) BP: (103-141)/(58-84) 141/84 mmHg (07/17 0744) SpO2:  [93 %-100 %] 98 % (07/17 0744) Weight:  [64.411 kg (142 lb)] 64.411 kg (142 lb) (07/17 0500) Weight change: -3.629 kg (-8 lb)    Intake/Output from previous day: 07/16 0701 - 07/17 0700 In: 2714.2 [I.V.:2714.2] Out: -  Intake/Output this shift:    General appearance: alert and cooperative Resp: clear to auscultation bilaterally Breasts: normal appearance, no masses or tenderness, positive findings: large R breast mass Cardio: regular rate and rhythm, S1, S2 normal, no murmur, click, rub or gallop GI: soft, non-tender; bowel sounds normal; no masses,  no organomegaly Extremities: extremities normal, atraumatic, no cyanosis or edema Neurologic: Motor: legs 4-/5  Lab Results:  Recent Labs  08/28/13 0039 08/29/13 0350  WBC 10.3 8.6  HGB 10.6* 10.4*  HCT 34.0* 33.3*  PLT 352 378   BMET  Recent Labs  08/28/13 0039 08/29/13 0350  NA 142 139  K 3.8 4.0  CL 104 100  CO2 25 22  GLUCOSE 108* 110*  BUN 15 12  CREATININE 0.62 0.40*  CALCIUM 10.6* 10.6*    Studies/Results: Ct Head Wo Contrast  08/28/2013   CLINICAL DATA:  The encephalopathy.  EXAM: CT HEAD WITHOUT CONTRAST  TECHNIQUE: Contiguous axial images were obtained from the base of the skull through the vertex without intravenous contrast.  COMPARISON:  None.  FINDINGS: Skull and Sinuses:There are ill-defined lytic lesions throughout the calvarium consistent with metastatic disease. The most notable lesions are in the right parietal and central occipital-parietal region measuring up to 3.5 cm in diameter. These lesions errode through the inner table, including at the level of the superior sagittal sinus.  Orbits: No acute abnormality.   Brain: No evidence of acute abnormality, such as acute infarction, hemorrhage, hydrocephalus, or mass lesion/mass effect. Apparent low-density in the medial right temporal lobe was evaluated on coronal reformats. This is in a region of streak artifact, the most likely cause. There is no mass effect in this region. No evidence of low-density along the cingulate gyrus or insula.  IMPRESSION: 1. No acute intracranial findings. 2. Multi focal calvarial metastatic disease. There is multi focal erosion through the inner table, including over the superior sagittal sinus. Suggest enhanced MRI followup.   Electronically Signed   By: Jorje Guild M.D.   On: 08/28/2013 02:21   Ct Chest W Contrast  08/29/2013   CLINICAL DATA:  Evaluate for source of malignancy.  EXAM: CT CHEST, ABDOMEN, AND PELVIS WITH CONTRAST  TECHNIQUE: Multidetector CT imaging of the chest, abdomen and pelvis was performed following the standard protocol during bolus administration of intravenous contrast.  CONTRAST:  20mL OMNIPAQUE IOHEXOL 300 MG/ML  SOLN  COMPARISON:  None.  FINDINGS: CT CHEST FINDINGS  THORACIC INLET/BODY WALL:  There is asymmetric, spiculated sub areolar tissue on the right. There is right axillary and deep pectoral lymphadenopathy with cortical thickening up to 1 cm. There is a 7 mm right supraclavicular lymph node which is moderately suspicious.  Bilateral thyroid nodules with coarse calcification, measuring up to 2 cm on the right. This is likely incidental given the patient's overall clinical status.  MEDIASTINUM:  Normal heart size. No pericardial effusion. There is residual thymus noted. Filling present within segmental arteries  of the right lower lobe. No lymphadenopathy.  LUNG WINDOWS:  No definitive metastasis. Small bilateral pleural effusions with dependent atelectasis.  OSSEOUS:  See below  CT ABDOMEN AND PELVIS FINDINGS  BODY WALL: Unremarkable.  Liver: There is a 5 cm mass in segment 6 which has peripheral nodular  enhancement and centripetal fill-in. There is a 5 mm and 7 mm low-density nodules within the right hepatic lobe which may enhance on delayed imaging.  Biliary: Prominent intrahepatic biliary tree without visible cause.  Pancreas: Prominent diameter of the main pancreatic duct, measuring 4 mm. There is no visible mass to cause obstruction.  Spleen: Unremarkable.  Adrenals: Unremarkable.  Kidneys and ureters: 4 mm nonobstructive stone in the interpolar left kidney.  Bladder: Focal thickening and enhancement along the right bladder base, measuring up to 7 mm in thickness. There is no excretory contrast to suggest layering hyperdense urine.  Reproductive: Hysterectomy.  Probable salpingo oophorectomies.  Bowel: Possible constipation. No bowel obstruction. No evidence of bowel inflammation.  Retroperitoneum: No mass or adenopathy.  Peritoneum: No ascites or pneumoperitoneum.  Vascular: No acute abnormality.  OSSEOUS: There is diffuse osteolytic and sclerotic bone metastases.  Notable deposits:  1. Extensive demineralization of the right pelvis, centered on the acetabulum, with extra osseous growth. No evidence of acute fracture. 2. 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 There is extra osseous tumor at the level of the left pedicle, which infiltrates the canal and left foramen. 3. L1 compression fracture with height loss approximately 25%. 4. Multiple spinal metastases likely have extra osseous tumor extension, including T5 with perispinal growth on the right. The T5 body is mildly compressed on the right, without discrete fracture line.  Critical Value/emergent results were called by telephone at the time of interpretation on 08/29/2013 at 1:24 am to [place], who verbally acknowledged these results.  IMPRESSION: 1. Segmental pulmonary embolism to the right lower lobe. 2. Findings consistent with right breast carcinoma with  axillary/pectoral nodal spread and diffuse osseous metastatic disease. 3. L1 and L3 pathologic compression fractures. Retropulsion and extraosseous tumor at L3 causes advanced spinal canal and left foraminal stenosis. 4. Extensive demineralization of the right acetabulum, at significant risk for pathologic fracture. 5. 5 cm hemangioma in the right liver. Two tiny low densities in the liver which can be followed by imaging. 6. Focal urothelial thickening in the right bladder, a possible second malignancy. 7. Borderline enlarged right supraclavicular lymph node.   Electronically Signed   By: Jorje Guild M.D.   On: 08/29/2013 01:31   Ct Abdomen Pelvis W Contrast  08/29/2013   CLINICAL DATA:  Evaluate for source of malignancy.  EXAM: CT CHEST, ABDOMEN, AND PELVIS WITH CONTRAST  TECHNIQUE: Multidetector CT imaging of the chest, abdomen and pelvis was performed following the standard protocol during bolus administration of intravenous contrast.  CONTRAST:  73mL OMNIPAQUE IOHEXOL 300 MG/ML  SOLN  COMPARISON:  None.  FINDINGS: CT CHEST FINDINGS  THORACIC INLET/BODY WALL:  There is asymmetric, spiculated sub areolar tissue on the right. There is right axillary and deep pectoral lymphadenopathy with cortical thickening up to 1 cm. There is a 7 mm right supraclavicular lymph node which is moderately suspicious.  Bilateral thyroid nodules with coarse calcification, measuring up to 2 cm on the right. This is likely incidental given the patient's overall clinical status.  MEDIASTINUM:  Normal heart size. No pericardial effusion. There is residual thymus noted. Filling present  within segmental arteries of the right lower lobe. No lymphadenopathy.  LUNG WINDOWS:  No definitive metastasis. Small bilateral pleural effusions with dependent atelectasis.  OSSEOUS:  See below  CT ABDOMEN AND PELVIS FINDINGS  BODY WALL: Unremarkable.  Liver: There is a 5 cm mass in segment 6 which has peripheral nodular enhancement and centripetal  fill-in. There is a 5 mm and 7 mm low-density nodules within the right hepatic lobe which may enhance on delayed imaging.  Biliary: Prominent intrahepatic biliary tree without visible cause.  Pancreas: Prominent diameter of the main pancreatic duct, measuring 4 mm. There is no visible mass to cause obstruction.  Spleen: Unremarkable.  Adrenals: Unremarkable.  Kidneys and ureters: 4 mm nonobstructive stone in the interpolar left kidney.  Bladder: Focal thickening and enhancement along the right bladder base, measuring up to 7 mm in thickness. There is no excretory contrast to suggest layering hyperdense urine.  Reproductive: Hysterectomy.  Probable salpingo oophorectomies.  Bowel: Possible constipation. No bowel obstruction. No evidence of bowel inflammation.  Retroperitoneum: No mass or adenopathy.  Peritoneum: No ascites or pneumoperitoneum.  Vascular: No acute abnormality.  OSSEOUS: There is diffuse osteolytic and sclerotic bone metastases.  Notable deposits:  1. Extensive demineralization of the right pelvis, centered on the acetabulum, with extra osseous growth. No evidence of acute fracture. 2. 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 There is extra osseous tumor at the level of the left pedicle, which infiltrates the canal and left foramen. 3. L1 compression fracture with height loss approximately 25%. 4. Multiple spinal metastases likely have extra osseous tumor extension, including T5 with perispinal growth on the right. The T5 body is mildly compressed on the right, without discrete fracture line.  Critical Value/emergent results were called by telephone at the time of interpretation on 08/29/2013 at 1:24 am to [place], who verbally acknowledged these results.  IMPRESSION: 1. Segmental pulmonary embolism to the right lower lobe. 2. Findings consistent with right breast carcinoma with axillary/pectoral nodal spread  and diffuse osseous metastatic disease. 3. L1 and L3 pathologic compression fractures. Retropulsion and extraosseous tumor at L3 causes advanced spinal canal and left foraminal stenosis. 4. Extensive demineralization of the right acetabulum, at significant risk for pathologic fracture. 5. 5 cm hemangioma in the right liver. Two tiny low densities in the liver which can be followed by imaging. 6. Focal urothelial thickening in the right bladder, a possible second malignancy. 7. Borderline enlarged right supraclavicular lymph node.   Electronically Signed   By: Jorje Guild M.D.   On: 08/29/2013 01:31   Dg Chest Port 1 View  08/27/2013   CLINICAL DATA:  Fever.  Recent diagnosis of cancer  EXAM: PORTABLE CHEST - 1 VIEW  COMPARISON:  None.  FINDINGS: Small streaky opacities in the lower lungs. No definitive pneumonia. No edema, effusion, or pneumothorax. There is pulmonary hyperinflation suggesting COPD. Normal heart size and upper mediastinal contours when accounting for distortion from rightward rotation.  IMPRESSION: COPD and mild basilar atelectasis. No definitive pneumonia to explain fever.   Electronically Signed   By: Jorje Guild M.D.   On: 08/27/2013 21:49    Medications: I have reviewed the patient's current medications.  Assessment/Plan: Principal Problem:   Breast cancer metastasized to multiple sites multiple bony metastases,consult oncology and general surgery for biopsy. Pain control.   Active Problems:   Spinal compression, progressive leg weakness over weeks, likely secondary to L3 compression, consult radiation  oncology ASAP. May need repeat spinal MRI, discuss with Rad onc. On dexamethasone.   Hypercalcemia, IV Normal saline, will defer to oncology   Pulmonary Embolus, on IV heparin   Sepsis admitted with fever but no sign of infection, antibiotics discontinued  blood cultures NG.   Acute encephalopathy resolved   Protein-calorie malnutrition, severe     LOS: 2 days    Kristie Jackson JOSEPH 08/29/2013, 7:52 AM

## 2013-08-30 LAB — COMPREHENSIVE METABOLIC PANEL
ALT: 14 U/L (ref 0–35)
ANION GAP: 14 (ref 5–15)
AST: 15 U/L (ref 0–37)
Albumin: 2.5 g/dL — ABNORMAL LOW (ref 3.5–5.2)
Alkaline Phosphatase: 109 U/L (ref 39–117)
BUN: 14 mg/dL (ref 6–23)
CALCIUM: 10.1 mg/dL (ref 8.4–10.5)
CHLORIDE: 103 meq/L (ref 96–112)
CO2: 26 meq/L (ref 19–32)
CREATININE: 0.38 mg/dL — AB (ref 0.50–1.10)
GFR calc Af Amer: >90 mL/min (ref >90)
Glucose, Bld: 105 mg/dL — ABNORMAL HIGH (ref 70–99)
Potassium: 3.7 mEq/L (ref 3.7–5.3)
Sodium: 143 mEq/L (ref 137–147)
Total Bilirubin: <0.2 mg/dL — ABNORMAL LOW (ref 0.3–1.2)
Total Protein: 6.5 g/dL (ref 6.0–8.3)

## 2013-08-30 LAB — CBC WITH DIFFERENTIAL/PLATELET
BASOS ABS: 0 10*3/uL (ref 0.0–0.1)
Basophils Relative: 0 % (ref 0–1)
EOS PCT: 0 % (ref 0–5)
Eosinophils Absolute: 0 10*3/uL (ref 0.0–0.7)
HCT: 31.8 % — ABNORMAL LOW (ref 36.0–46.0)
Hemoglobin: 10.1 g/dL — ABNORMAL LOW (ref 12.0–15.0)
LYMPHS PCT: 27 % (ref 12–46)
Lymphs Abs: 2.1 10*3/uL (ref 0.7–4.0)
MCH: 28.9 pg (ref 26.0–34.0)
MCHC: 31.8 g/dL (ref 30.0–36.0)
MCV: 91.1 fL (ref 78.0–100.0)
Monocytes Absolute: 0.5 10*3/uL (ref 0.1–1.0)
Monocytes Relative: 6 % (ref 3–12)
NEUTROS ABS: 5.2 10*3/uL (ref 1.7–7.7)
Neutrophils Relative %: 67 % (ref 43–77)
PLATELETS: 374 10*3/uL (ref 150–400)
RBC: 3.49 MIL/uL — ABNORMAL LOW (ref 3.87–5.11)
RDW: 13.9 % (ref 11.5–15.5)
WBC: 7.8 10*3/uL (ref 4.0–10.5)

## 2013-08-30 LAB — PROCALCITONIN

## 2013-08-30 LAB — CANCER ANTIGEN 27.29: CA 27.29: 20 U/mL (ref 0–39)

## 2013-08-30 LAB — HEPARIN LEVEL (UNFRACTIONATED): HEPARIN UNFRACTIONATED: 0.67 [IU]/mL (ref 0.30–0.70)

## 2013-08-30 NOTE — Progress Notes (Signed)
ANTICOAGULATION CONSULT NOTE - Follow Up Consult   Pharmacy Consult for heparin Indication: pulmonary embolus  Allergies  Allergen Reactions  . Morphine And Related Nausea And Vomiting    Patient Measurements: Height: 5\' 8"  (172.7 cm) Weight: 134 lb 14.7 oz (61.2 kg) IBW/kg (Calculated) : 63.9  Vital Signs: Temp: 97.8 F (36.6 C) (07/18 0742) Temp src: Oral (07/18 0742) BP: 132/67 mmHg (07/18 0742) Pulse Rate: 51 (07/18 0742)  Labs:  Recent Labs  08/28/13 0039 08/29/13 0350 08/29/13 0920 08/29/13 1655 08/30/13 0250  HGB 10.6* 10.4*  --   --  10.1*  HCT 34.0* 33.3*  --   --  31.8*  PLT 352 378  --   --  374  HEPARINUNFRC  --   --  0.52 0.68 0.67  CREATININE 0.62 0.40*  --   --  0.38*    Estimated Creatinine Clearance: 75 ml/min (by C-G formula based on Cr of 0.38).   Medications:  Scheduled:  . dexamethasone  4 mg Intravenous 4 times per day  . DULoxetine  60 mg Oral QHS  . feeding supplement (ENSURE COMPLETE)  237 mL Oral BID BM  . gabapentin  1,200 mg Oral TID  . levothyroxine  75 mcg Oral Once per day on Sun Tue Wed Fri Sat  . simvastatin  40 mg Oral Daily  . topiramate  100 mg Oral QHS   Infusions:  . sodium chloride 1,000 mL (08/30/13 0836)  . heparin 1,100 Units/hr (08/29/13 2342)    Assessment: 58 yo F with a significant PMH consisting of spinal cord injury with semi-functional paraplegia.  Patient presented to ED on 7/15 with a temperature of 101.5 at home. Patient admitted for concerns of sepsis and encephalopathy. Patient also has recent diagnosis of metastatic cancer. CT revealed PE to RLL.  Pharmacy consulted to dose heparin. HL today remains therapeutic at 0.67, H/H slowly trending down with hgb now 10.1, plt wnl and stable, no reported s/s bleeding.  Goal of Therapy:  Heparin level 0.3-0.7 units/ml Monitor platelets by anticoagulation protocol: Yes   Plan:  - Continue heparin infusion at 1100 units/hr - Daily HL - Monitor CBC, s/s  bleeding  Harolyn Rutherford, PharmD Clinical Pharmacist - Resident Pager: [phone] Pharmacy: [phone] 08/30/2013 9:56 AM

## 2013-08-30 NOTE — Progress Notes (Addendum)
Subjective: Patient is pleasant, no apparent distress. Recommendations from neurosurgery, oncology, radiation oncology review. Tissue diagnosis required. Patient remains afebrile, hemodynamically stable.  Objective: Vital signs in last 24 hours: Temp:  [97.7 F (36.5 C)-97.8 F (36.6 C)] 97.8 F (36.6 C) (07/18 0742) Pulse Rate:  [46-98] 51 (07/18 0742) Resp:  [9-21] 11 (07/18 0742) BP: (102-132)/(54-70) 132/67 mmHg (07/18 0742) SpO2:  [95 %-100 %] 99 % (07/18 0742) Weight:  [61.2 kg (134 lb 14.7 oz)] 61.2 kg (134 lb 14.7 oz) (07/18 0441) Weight change: -3.211 kg (-7 lb 1.3 oz) Last BM Date: 08/29/13  Intake/Output from previous day: 07/17 0701 - 07/18 0700 In: 2319 [P.O.:780; I.V.:1539] Out: -  Intake/Output this shift: Total I/O In: 136 [I.V.:136] Out: -   General appearance: alert and cooperative Resp: clear to auscultation bilaterally Cardio: regular rate and rhythm, S1, S2 normal, no murmur, click, rub or gallop GI: soft, non-tender; bowel sounds normal; no masses,  no organomegaly Neurologic: Motor: Paraplegia   Lab Results:  Results for orders placed during the hospital encounter of 08/27/13 (from the past 24 hour(s))  HEPARIN LEVEL (UNFRACTIONATED)     Status: None   Collection Time    08/29/13  4:55 PM      Result Value Ref Range   Heparin Unfractionated 0.68  0.30 - 0.70 IU/mL  CANCER ANTIGEN 27.29     Status: None   Collection Time    08/29/13  4:55 PM      Result Value Ref Range   CA 27.29 20  0 - 39 U/mL  LACTATE DEHYDROGENASE     Status: None   Collection Time    08/29/13  4:55 PM      Result Value Ref Range   LDH 184  94 - 250 U/L  COMPREHENSIVE METABOLIC PANEL     Status: Abnormal   Collection Time    08/30/13  2:50 AM      Result Value Ref Range   Sodium 143  137 - 147 mEq/L   Potassium 3.7  3.7 - 5.3 mEq/L   Chloride 103  96 - 112 mEq/L   CO2 26  19 - 32 mEq/L   Glucose, Bld 105 (*) 70 - 99 mg/dL   BUN 14  6 - 23 mg/dL   Creatinine, Ser  0.38 (*) 0.50 - 1.10 mg/dL   Calcium 10.1  8.4 - 10.5 mg/dL   Total Protein 6.5  6.0 - 8.3 g/dL   Albumin 2.5 (*) 3.5 - 5.2 g/dL   AST 15  0 - 37 U/L   ALT 14  0 - 35 U/L   Alkaline Phosphatase 109  39 - 117 U/L   Total Bilirubin <0.2 (*) 0.3 - 1.2 mg/dL   GFR calc non Af Amer >90  >90 mL/min   GFR calc Af Amer >90  >90 mL/min   Anion gap 14  5 - 15  CBC WITH DIFFERENTIAL     Status: Abnormal   Collection Time    08/30/13  2:50 AM      Result Value Ref Range   WBC 7.8  4.0 - 10.5 K/uL   RBC 3.49 (*) 3.87 - 5.11 MIL/uL   Hemoglobin 10.1 (*) 12.0 - 15.0 g/dL   HCT 31.8 (*) 36.0 - 46.0 %   MCV 91.1  78.0 - 100.0 fL   MCH 28.9  26.0 - 34.0 pg   MCHC 31.8  30.0 - 36.0 g/dL   RDW 13.9  11.5 - 15.5 %  Platelets 374  150 - 400 K/uL   Neutrophils Relative % 67  43 - 77 %   Neutro Abs 5.2  1.7 - 7.7 K/uL   Lymphocytes Relative 27  12 - 46 %   Lymphs Abs 2.1  0.7 - 4.0 K/uL   Monocytes Relative 6  3 - 12 %   Monocytes Absolute 0.5  0.1 - 1.0 K/uL   Eosinophils Relative 0  0 - 5 %   Eosinophils Absolute 0.0  0.0 - 0.7 K/uL   Basophils Relative 0  0 - 1 %   Basophils Absolute 0.0  0.0 - 0.1 K/uL  HEPARIN LEVEL (UNFRACTIONATED)     Status: None   Collection Time    08/30/13  2:50 AM      Result Value Ref Range   Heparin Unfractionated 0.67  0.30 - 0.70 IU/mL  PROCALCITONIN     Status: None   Collection Time    08/30/13  2:50 AM      Result Value Ref Range   Procalcitonin <0.10        Studies/Results: Ct Chest W Contrast  08/29/2013   CLINICAL DATA:  Evaluate for source of malignancy.  EXAM: CT CHEST, ABDOMEN, AND PELVIS WITH CONTRAST  TECHNIQUE: Multidetector CT imaging of the chest, abdomen and pelvis was performed following the standard protocol during bolus administration of intravenous contrast.  CONTRAST:  86mL OMNIPAQUE IOHEXOL 300 MG/ML  SOLN  COMPARISON:  None.  FINDINGS: CT CHEST FINDINGS  THORACIC INLET/BODY WALL:  There is asymmetric, spiculated sub areolar tissue on  the right. There is right axillary and deep pectoral lymphadenopathy with cortical thickening up to 1 cm. There is a 7 mm right supraclavicular lymph node which is moderately suspicious.  Bilateral thyroid nodules with coarse calcification, measuring up to 2 cm on the right. This is likely incidental given the patient's overall clinical status.  MEDIASTINUM:  Normal heart size. No pericardial effusion. There is residual thymus noted. Filling present within segmental arteries of the right lower lobe. No lymphadenopathy.  LUNG WINDOWS:  No definitive metastasis. Small bilateral pleural effusions with dependent atelectasis.  OSSEOUS:  See below  CT ABDOMEN AND PELVIS FINDINGS  BODY WALL: Unremarkable.  Liver: There is a 5 cm mass in segment 6 which has peripheral nodular enhancement and centripetal fill-in. There is a 5 mm and 7 mm low-density nodules within the right hepatic lobe which may enhance on delayed imaging.  Biliary: Prominent intrahepatic biliary tree without visible cause.  Pancreas: Prominent diameter of the main pancreatic duct, measuring 4 mm. There is no visible mass to cause obstruction.  Spleen: Unremarkable.  Adrenals: Unremarkable.  Kidneys and ureters: 4 mm nonobstructive stone in the interpolar left kidney.  Bladder: Focal thickening and enhancement along the right bladder base, measuring up to 7 mm in thickness. There is no excretory contrast to suggest layering hyperdense urine.  Reproductive: Hysterectomy.  Probable salpingo oophorectomies.  Bowel: Possible constipation. No bowel obstruction. No evidence of bowel inflammation.  Retroperitoneum: No mass or adenopathy.  Peritoneum: No ascites or pneumoperitoneum.  Vascular: No acute abnormality.  OSSEOUS: There is diffuse osteolytic and sclerotic bone metastases.  Notable deposits:  1. Extensive demineralization of the right pelvis, centered on the acetabulum, with extra osseous growth. No evidence of acute fracture. 2. L3 compression fracture  with greater than 50% height loss and bony retropulsion causing moderate to advanced spinal canal stenosis. There is extra osseous tumor at the level of the left pedicle, which infiltrates  the canal and left foramen. 3. L1 compression fracture with height loss approximately 25%. 4. Multiple spinal metastases likely have extra osseous tumor extension, including T5 with perispinal growth on the right. The T5 body is mildly compressed on the right, without discrete fracture line.  Critical Value/emergent results were called by telephone at the time of interpretation on 08/29/2013 at 1:24 am to [place], who verbally acknowledged these results.  IMPRESSION: 1. Segmental pulmonary embolism to the right lower lobe. 2. Findings consistent with right breast carcinoma with axillary/pectoral nodal spread and diffuse osseous metastatic disease. 3. L1 and L3 pathologic compression fractures. Retropulsion and extraosseous tumor at L3 causes advanced spinal canal and left foraminal stenosis. 4. Extensive demineralization of the right acetabulum, at significant risk for pathologic fracture. 5. 5 cm hemangioma in the right liver. Two tiny low densities in the liver which can be followed by imaging. 6. Focal urothelial thickening in the right bladder, a possible second malignancy. 7. Borderline enlarged right supraclavicular lymph node.   Electronically Signed   By: Jorje Guild M.D.   On: 08/29/2013 01:31   Mr Jeri Cos PP Contrast  08/29/2013   CLINICAL DATA:  Extensive metastatic disease.  EXAM: MRI HEAD WITHOUT AND WITH CONTRAST  TECHNIQUE: Multiplanar, multiecho pulse sequences of the brain and surrounding structures were obtained without and with intravenous contrast.  CONTRAST:  61mL MULTIHANCE GADOBENATE DIMEGLUMINE 529 MG/ML IV SOLN  COMPARISON:  CT head without contrast 08/28/2013  FINDINGS: Multiple skull metastases are again noted. The largest lesions are in the parietal bones and at the apex of the occipital bone  just below the lambdoid suture. The lesion in the right parietal bone extends to the dura with some dural thickening. There PE occipital lesion also extends to the dura adjacent to the superior sagittal sinus. Sinus is patent.  No parenchymal lesions are evident. No acute infarct, hemorrhage or parenchymal mass lesion is present. There is no significant white matter disease.  Flow is present in the major intracranial arteries. The globes and orbits are intact. The paranasal sinuses and mastoid air cells are clear.  IMPRESSION: 1. Extensive skull metastases. 2. At least 2 of these lesions extend to the dura with focal dural enhancement suggesting dural metastases or reactive change associated with the adjacent skull metastasis. 3. No evidence for parenchymal metastases. 4. The MRI appearance of the brain is normal for age.   Electronically Signed   By: Lawrence Santiago M.D.   On: 08/29/2013 13:55   Mr Cervical Spine W Wo Contrast  08/29/2013   CLINICAL DATA:  Metastatic disease to the bones.  EXAM: MRI CERVICAL SPINE WITHOUT AND WITH CONTRAST  TECHNIQUE: Multiplanar and multiecho pulse sequences of the cervical spine, to include the craniocervical junction and cervicothoracic junction, were obtained according to standard protocol without and with intravenous contrast.  CONTRAST:  45mL MULTIHANCE GADOBENATE DIMEGLUMINE 529 MG/ML IV SOLN  COMPARISON:  None.  FINDINGS: There are metastatic lesions involving every cervical vertebra as well as the clivus and the visualized portions of the occipital and parietal bones. There is a pathologic compression fracture of the anterior aspect of T1. There is no tumor protrusion into the spinal canal or neural foramina.  There is a small central disc protrusion at C5-6 which indents the ventral aspect of the spinal cord without myelopathy. This protrusion extends into the right neural foramen and osteophytes extend into the left neural foramen is could affect either or both C6  nerves.  Tumor is most  prominent in the right lateral mass of C6 and in the left lateral mass of C7. There is edema in the posterior paraspinal musculature throughout the cervical spine.  The metastases enhance after contrast administration.  Lymphoid tissue is prominent at the base of the tongue.  There is a complex nodule in the right side of the thyroid gland.  The patient has had previous posterior decompression at C3-4.  There is a tiny focal area of abnormal signal in the left side of the spinal cord at that level which is most likely myelopathy secondary to the now relieved neural impingement.  IMPRESSION: 1. Osseous metastases in the visualized portions of the skull and at each level level of the cervical spine. No visible neural impingement by tumor at this time. 2. Pathologic fracture of the anterior aspect of T1. T1 is diffusely infiltrated by tumor. 3. No epidural tumor.   Electronically Signed   By: Rozetta Nunnery M.D.   On: 08/29/2013 13:33   Mr Thoracic Spine W Wo Contrast  08/29/2013   CLINICAL DATA:  Diffuse osseous metastases from unknown primary tumor.  EXAM: MRI THORACIC SPINE WITHOUT AND WITH CONTRAST  TECHNIQUE: Multiplanar and multiecho pulse sequences of the thoracic spine were obtained without and with intravenous contrast.  CONTRAST:  43mL MULTIHANCE GADOBENATE DIMEGLUMINE 529 MG/ML IV SOLN  COMPARISON:  CT scan dated 08/28/2013  FINDINGS: There are metastatic lesions involving every vertebra in the thoracic spine. There is a pathologic compression fracture of the anterior aspect of T1. There is extensive tumor infiltration and T5, T7, T8, T10, T12, and L1.  Tumor extends into the left pedicle at T11 and slightly narrows the left neural foramen and slightly compresses the left posterior lateral aspect of the thecal sac but does not compress the spinal cord. There is a subtle pathologic compression fracture of the right side of the superior endplate of T5. No protrusion of bone or tumor or  disc material into the spinal canal. No other pathologic fractures no visible epidural tumor.  IMPRESSION: 1. Extensive metastatic disease throughout the thoracic spine with a subtle pathologic fracture of the superior aspect of T5. 2. No epidural tumor or neural impingement.   Electronically Signed   By: Rozetta Nunnery M.D.   On: 08/29/2013 13:57   Mr Lumbar Spine W Wo Contrast  08/29/2013   CLINICAL DATA:  Widespread metastatic disease to the bones.  EXAM: MRI LUMBAR SPINE WITHOUT AND WITH CONTRAST  TECHNIQUE: Multiplanar and multiecho pulse sequences of the lumbar spine were obtained without and with intravenous contrast.  CONTRAST:  60mL MULTIHANCE GADOBENATE DIMEGLUMINE 529 MG/ML IV SOLN  COMPARISON:  CT scan dated 08/28/2013  FINDINGS: Conus tip is at the inferior aspect of L2.  Metastatic disease extensively involves the entire lumbar spine as well as the sacrum in the adjacent iliac bones. There is a pathologic compression fracture of the L3 vertebra with protrusion of the posterior aspect of the infiltrated vertebral body into the spinal canal compressing the left side of the thecal sac in the left lateral recess. The tumor extends into the left pedicle, lamina, and facets and also extends into the left neural foramen at L3-4.  There is a slight compression deformity of the central portion of the L1 vertebra but there is no protrusion of bone or tumor or disc into the spinal canal at that level.  There is degenerative disc disease at L5-S1 with disc space narrowing and a 4 mm retrolisthesis with a small broad-based disc  bulge with accompanying osteophytes. This touches both S1 nerves but does not compress them. There is fairly severe left foraminal stenosis at L5-S1.  There is diffuse edema in the paraspinal musculature.  After contrast administration there numerous metastases enhance.  IMPRESSION: 1. Diffuse metastatic disease throughout the spine with a compression fracture of L3. Infiltrated bone  protrudes into the spinal canal and compresses the left lateral recess in the left neural foramen at L3-4. 2. Slight pathologic compression fracture of the central portion of L1 without neural impingement. 3. There is no definitive epidural tumor.   Electronically Signed   By: Rozetta Nunnery M.D.   On: 08/29/2013 13:40   Ct Abdomen Pelvis W Contrast  08/29/2013   CLINICAL DATA:  Evaluate for source of malignancy.  EXAM: CT CHEST, ABDOMEN, AND PELVIS WITH CONTRAST  TECHNIQUE: Multidetector CT imaging of the chest, abdomen and pelvis was performed following the standard protocol during bolus administration of intravenous contrast.  CONTRAST:  37mL OMNIPAQUE IOHEXOL 300 MG/ML  SOLN  COMPARISON:  None.  FINDINGS: CT CHEST FINDINGS  THORACIC INLET/BODY WALL:  There is asymmetric, spiculated sub areolar tissue on the right. There is right axillary and deep pectoral lymphadenopathy with cortical thickening up to 1 cm. There is a 7 mm right supraclavicular lymph node which is moderately suspicious.  Bilateral thyroid nodules with coarse calcification, measuring up to 2 cm on the right. This is likely incidental given the patient's overall clinical status.  MEDIASTINUM:  Normal heart size. No pericardial effusion. There is residual thymus noted. Filling present within segmental arteries of the right lower lobe. No lymphadenopathy.  LUNG WINDOWS:  No definitive metastasis. Small bilateral pleural effusions with dependent atelectasis.  OSSEOUS:  See below  CT ABDOMEN AND PELVIS FINDINGS  BODY WALL: Unremarkable.  Liver: There is a 5 cm mass in segment 6 which has peripheral nodular enhancement and centripetal fill-in. There is a 5 mm and 7 mm low-density nodules within the right hepatic lobe which may enhance on delayed imaging.  Biliary: Prominent intrahepatic biliary tree without visible cause.  Pancreas: Prominent diameter of the main pancreatic duct, measuring 4 mm. There is no visible mass to cause obstruction.  Spleen:  Unremarkable.  Adrenals: Unremarkable.  Kidneys and ureters: 4 mm nonobstructive stone in the interpolar left kidney.  Bladder: Focal thickening and enhancement along the right bladder base, measuring up to 7 mm in thickness. There is no excretory contrast to suggest layering hyperdense urine.  Reproductive: Hysterectomy.  Probable salpingo oophorectomies.  Bowel: Possible constipation. No bowel obstruction. No evidence of bowel inflammation.  Retroperitoneum: No mass or adenopathy.  Peritoneum: No ascites or pneumoperitoneum.  Vascular: No acute abnormality.  OSSEOUS: There is diffuse osteolytic and sclerotic bone metastases.  Notable deposits:  1. Extensive demineralization of the right pelvis, centered on the acetabulum, with extra osseous growth. No evidence of acute fracture. 2. 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 There is extra osseous tumor at the level of the left pedicle, which infiltrates the canal and left foramen. 3. L1 compression fracture with height loss approximately 25%. 4. Multiple spinal metastases likely have extra osseous tumor extension, including T5 with perispinal growth on the right. The T5 body is mildly compressed on the right, without discrete fracture line.  Critical Value/emergent results were called by telephone at the time of interpretation on 08/29/2013 at 1:24 am to [place], who verbally acknowledged these results.  IMPRESSION:  1. Segmental pulmonary embolism to the right lower lobe. 2. Findings consistent with right breast carcinoma with axillary/pectoral nodal spread and diffuse osseous metastatic disease. 3. L1 and L3 pathologic compression fractures. Retropulsion and extraosseous tumor at L3 causes advanced spinal canal and left foraminal stenosis. 4. Extensive demineralization of the right acetabulum, at significant risk for pathologic fracture. 5. 5 cm hemangioma in the right  liver. Two tiny low densities in the liver which can be followed by imaging. 6. Focal urothelial thickening in the right bladder, a possible second malignancy. 7. Borderline enlarged right supraclavicular lymph node.   Electronically Signed   By: Jorje Guild M.D.   On: 08/29/2013 01:31    Medications:  I have reviewed the patient's current medications. Prior to Admission:  Prescriptions prior to admission  Medication Sig Dispense Refill  . acetaminophen (TYLENOL) 500 MG tablet Take 1,000 mg by mouth every 6 (six) hours as needed for moderate pain.      . diazepam (VALIUM) 10 MG tablet Take 10 mg by mouth every 6 (six) hours as needed. For muscle spasms      . DULoxetine (CYMBALTA) 60 MG capsule Take 60 mg by mouth at bedtime.      . gabapentin (NEURONTIN) 600 MG tablet Take 1,200 mg by mouth 3 (three) times daily.      Marland Kitchen levothyroxine (SYNTHROID, LEVOTHROID) 75 MCG tablet Take 75 mcg by mouth See admin instructions. Takes daily except Monday and Thursday      . oxyCODONE-acetaminophen (PERCOCET/ROXICET) 5-325 MG per tablet Take 1-2 tablets by mouth every 6 (six) hours as needed for severe pain.  20 tablet  0  . rizatriptan (MAXALT) 10 MG tablet Take 10 mg by mouth as needed for migraine. May repeat in 2 hours if needed      . simvastatin (ZOCOR) 40 MG tablet Take 40 mg by mouth daily.      Marland Kitchen tiZANidine (ZANAFLEX) 4 MG tablet Take 8 mg by mouth 3 (three) times daily.      Marland Kitchen topiramate (TOPAMAX) 100 MG tablet Take 100 mg by mouth at bedtime.       Scheduled: . dexamethasone  4 mg Intravenous 4 times per day  . DULoxetine  60 mg Oral QHS  . feeding supplement (ENSURE COMPLETE)  237 mL Oral BID BM  . gabapentin  1,200 mg Oral TID  . levothyroxine  75 mcg Oral Once per day on Sun Tue Wed Fri Sat  . simvastatin  40 mg Oral Daily  . topiramate  100 mg Oral QHS   Continuous: . sodium chloride 1,000 mL (08/30/13 0836)  . heparin 1,100 Units/hr (08/29/13 2342)   TGG:[id number], diazepam,  HYDROmorphone (DILAUDID) injection, HYDROmorphone (DILAUDID) injection, ondansetron (ZOFRAN) IV, oxyCODONE-acetaminophen  Assessment/Plan: Principal Problem:   Breast cancer metastasized to multiple sites, per neurosurgery no plans for surgery.  continue Decadron, plans for XRT after biopsy established Active Problems:     Fracture of L3 vertebra   Acute encephalopathy, improved  Protein-calorie malnutrition, severe   Hypercalcemia, improved   Pulmonary embolus, stable on IV heparin Lower extremity weakness/paralysis     LOS: 3 days   Kristie Jackson D 08/30/2013, 9:44 AM

## 2013-08-30 NOTE — Progress Notes (Signed)
Pt. Requested that all bath and linen supplies be giving to private sitter. Tech gave all supplies to [employer] and offered assistance with bath.

## 2013-08-31 LAB — CBC WITH DIFFERENTIAL/PLATELET
Basophils Absolute: 0 10*3/uL (ref 0.0–0.1)
Basophils Relative: 0 % (ref 0–1)
Eosinophils Absolute: 0 10*3/uL (ref 0.0–0.7)
Eosinophils Relative: 0 % (ref 0–5)
HEMATOCRIT: 30.6 % — AB (ref 36.0–46.0)
Hemoglobin: 9.6 g/dL — ABNORMAL LOW (ref 12.0–15.0)
LYMPHS PCT: 26 % (ref 12–46)
Lymphs Abs: 2.2 10*3/uL (ref 0.7–4.0)
MCH: 29 pg (ref 26.0–34.0)
MCHC: 31.4 g/dL (ref 30.0–36.0)
MCV: 92.4 fL (ref 78.0–100.0)
MONO ABS: 0.6 10*3/uL (ref 0.1–1.0)
Monocytes Relative: 8 % (ref 3–12)
NEUTROS ABS: 5.6 10*3/uL (ref 1.7–7.7)
Neutrophils Relative %: 66 % (ref 43–77)
Platelets: 382 10*3/uL (ref 150–400)
RBC: 3.31 MIL/uL — ABNORMAL LOW (ref 3.87–5.11)
RDW: 14.2 % (ref 11.5–15.5)
WBC: 8.5 10*3/uL (ref 4.0–10.5)

## 2013-08-31 LAB — COMPREHENSIVE METABOLIC PANEL
ALBUMIN: 2.4 g/dL — AB (ref 3.5–5.2)
ALT: 20 U/L (ref 0–35)
ANION GAP: 14 (ref 5–15)
AST: 19 U/L (ref 0–37)
Alkaline Phosphatase: 102 U/L (ref 39–117)
BUN: 14 mg/dL (ref 6–23)
CO2: 24 mEq/L (ref 19–32)
Calcium: 9.5 mg/dL (ref 8.4–10.5)
Chloride: 107 mEq/L (ref 96–112)
Creatinine, Ser: 0.38 mg/dL — ABNORMAL LOW (ref 0.50–1.10)
GFR calc Af Amer: >90 mL/min (ref >90)
GFR calc non Af Amer: >90 mL/min (ref >90)
Glucose, Bld: 110 mg/dL — ABNORMAL HIGH (ref 70–99)
POTASSIUM: 4.1 meq/L (ref 3.7–5.3)
SODIUM: 145 meq/L (ref 137–147)
TOTAL PROTEIN: 6.1 g/dL (ref 6.0–8.3)
Total Bilirubin: <0.2 mg/dL — ABNORMAL LOW (ref 0.3–1.2)

## 2013-08-31 LAB — HEPARIN LEVEL (UNFRACTIONATED)
Heparin Unfractionated: 0.29 IU/mL — ABNORMAL LOW (ref 0.30–0.70)
Heparin Unfractionated: 0.43 IU/mL (ref 0.30–0.70)

## 2013-08-31 MED ORDER — DOCUSATE SODIUM 100 MG PO CAPS
100.0000 mg | ORAL_CAPSULE | Freq: Every day | ORAL | Status: DC | PRN
  Filled 2013-08-31: qty 1

## 2013-08-31 NOTE — Progress Notes (Signed)
Subjective: No new problems overnight, overall plan reiterated to patient again today.  Objective: Vital signs in last 24 hours: Temp:  [97.1 F (36.2 C)-97.5 F (36.4 C)] 97.5 F (36.4 C) (07/19 0732) Pulse Rate:  [53-83] 53 (07/19 0732) Resp:  [11-19] 12 (07/19 0732) BP: (116-163)/(68-98) 163/88 mmHg (07/19 0732) SpO2:  [96 %-100 %] 97 % (07/19 0732) Weight change:  Last BM Date: 08/29/13  Intake/Output from previous day: 07/18 0701 - 07/19 0700 In: 3216 [P.O.:360; I.V.:2856] Out: -  Intake/Output this shift: Total I/O In: 546 [I.V.:546] Out: -   General appearance: alert and cooperative Resp: clear to auscultation bilaterally Cardio: regular rate and rhythm, S1, S2 normal, no murmur, click, rub or gallop GI: soft, non-tender; bowel sounds normal; no masses,  no organomegaly Neurologic: Motor: Paraplegia  Lab Results:  Results for orders placed during the hospital encounter of 08/27/13 (from the past 24 hour(s))  COMPREHENSIVE METABOLIC PANEL     Status: Abnormal   Collection Time    08/31/13  2:50 AM      Result Value Ref Range   Sodium 145  137 - 147 mEq/L   Potassium 4.1  3.7 - 5.3 mEq/L   Chloride 107  96 - 112 mEq/L   CO2 24  19 - 32 mEq/L   Glucose, Bld 110 (*) 70 - 99 mg/dL   BUN 14  6 - 23 mg/dL   Creatinine, Ser 0.38 (*) 0.50 - 1.10 mg/dL   Calcium 9.5  8.4 - 10.5 mg/dL   Total Protein 6.1  6.0 - 8.3 g/dL   Albumin 2.4 (*) 3.5 - 5.2 g/dL   AST 19  0 - 37 U/L   ALT 20  0 - 35 U/L   Alkaline Phosphatase 102  39 - 117 U/L   Total Bilirubin <0.2 (*) 0.3 - 1.2 mg/dL   GFR calc non Af Amer >90  >90 mL/min   GFR calc Af Amer >90  >90 mL/min   Anion gap 14  5 - 15  CBC WITH DIFFERENTIAL     Status: Abnormal   Collection Time    08/31/13  2:50 AM      Result Value Ref Range   WBC 8.5  4.0 - 10.5 K/uL   RBC 3.31 (*) 3.87 - 5.11 MIL/uL   Hemoglobin 9.6 (*) 12.0 - 15.0 g/dL   HCT 30.6 (*) 36.0 - 46.0 %   MCV 92.4  78.0 - 100.0 fL   MCH 29.0  26.0 - 34.0  pg   MCHC 31.4  30.0 - 36.0 g/dL   RDW 14.2  11.5 - 15.5 %   Platelets 382  150 - 400 K/uL   Neutrophils Relative % 66  43 - 77 %   Neutro Abs 5.6  1.7 - 7.7 K/uL   Lymphocytes Relative 26  12 - 46 %   Lymphs Abs 2.2  0.7 - 4.0 K/uL   Monocytes Relative 8  3 - 12 %   Monocytes Absolute 0.6  0.1 - 1.0 K/uL   Eosinophils Relative 0  0 - 5 %   Eosinophils Absolute 0.0  0.0 - 0.7 K/uL   Basophils Relative 0  0 - 1 %   Basophils Absolute 0.0  0.0 - 0.1 K/uL  HEPARIN LEVEL (UNFRACTIONATED)     Status: Abnormal   Collection Time    08/31/13  2:50 AM      Result Value Ref Range   Heparin Unfractionated 0.29 (*) 0.30 - 0.70 IU/mL  Studies/Results: Mr Kizzie Fantasia Contrast  08/29/2013   CLINICAL DATA:  Extensive metastatic disease.  EXAM: MRI HEAD WITHOUT AND WITH CONTRAST  TECHNIQUE: Multiplanar, multiecho pulse sequences of the brain and surrounding structures were obtained without and with intravenous contrast.  CONTRAST:  26mL MULTIHANCE GADOBENATE DIMEGLUMINE 529 MG/ML IV SOLN  COMPARISON:  CT head without contrast 08/28/2013  FINDINGS: Multiple skull metastases are again noted. The largest lesions are in the parietal bones and at the apex of the occipital bone just below the lambdoid suture. The lesion in the right parietal bone extends to the dura with some dural thickening. There PE occipital lesion also extends to the dura adjacent to the superior sagittal sinus. Sinus is patent.  No parenchymal lesions are evident. No acute infarct, hemorrhage or parenchymal mass lesion is present. There is no significant white matter disease.  Flow is present in the major intracranial arteries. The globes and orbits are intact. The paranasal sinuses and mastoid air cells are clear.  IMPRESSION: 1. Extensive skull metastases. 2. At least 2 of these lesions extend to the dura with focal dural enhancement suggesting dural metastases or reactive change associated with the adjacent skull metastasis. 3. No  evidence for parenchymal metastases. 4. The MRI appearance of the brain is normal for age.   Electronically Signed   By: Lawrence Santiago M.D.   On: 08/29/2013 13:55   Mr Cervical Spine W Wo Contrast  08/29/2013   CLINICAL DATA:  Metastatic disease to the bones.  EXAM: MRI CERVICAL SPINE WITHOUT AND WITH CONTRAST  TECHNIQUE: Multiplanar and multiecho pulse sequences of the cervical spine, to include the craniocervical junction and cervicothoracic junction, were obtained according to standard protocol without and with intravenous contrast.  CONTRAST:  52mL MULTIHANCE GADOBENATE DIMEGLUMINE 529 MG/ML IV SOLN  COMPARISON:  None.  FINDINGS: There are metastatic lesions involving every cervical vertebra as well as the clivus and the visualized portions of the occipital and parietal bones. There is a pathologic compression fracture of the anterior aspect of T1. There is no tumor protrusion into the spinal canal or neural foramina.  There is a small central disc protrusion at C5-6 which indents the ventral aspect of the spinal cord without myelopathy. This protrusion extends into the right neural foramen and osteophytes extend into the left neural foramen is could affect either or both C6 nerves.  Tumor is most prominent in the right lateral mass of C6 and in the left lateral mass of C7. There is edema in the posterior paraspinal musculature throughout the cervical spine.  The metastases enhance after contrast administration.  Lymphoid tissue is prominent at the base of the tongue.  There is a complex nodule in the right side of the thyroid gland.  The patient has had previous posterior decompression at C3-4.  There is a tiny focal area of abnormal signal in the left side of the spinal cord at that level which is most likely myelopathy secondary to the now relieved neural impingement.  IMPRESSION: 1. Osseous metastases in the visualized portions of the skull and at each level level of the cervical spine. No visible neural  impingement by tumor at this time. 2. Pathologic fracture of the anterior aspect of T1. T1 is diffusely infiltrated by tumor. 3. No epidural tumor.   Electronically Signed   By: Rozetta Nunnery M.D.   On: 08/29/2013 13:33   Mr Thoracic Spine W Wo Contrast  08/29/2013   CLINICAL DATA:  Diffuse osseous metastases from unknown primary  tumor.  EXAM: MRI THORACIC SPINE WITHOUT AND WITH CONTRAST  TECHNIQUE: Multiplanar and multiecho pulse sequences of the thoracic spine were obtained without and with intravenous contrast.  CONTRAST:  83mL MULTIHANCE GADOBENATE DIMEGLUMINE 529 MG/ML IV SOLN  COMPARISON:  CT scan dated 08/28/2013  FINDINGS: There are metastatic lesions involving every vertebra in the thoracic spine. There is a pathologic compression fracture of the anterior aspect of T1. There is extensive tumor infiltration and T5, T7, T8, T10, T12, and L1.  Tumor extends into the left pedicle at T11 and slightly narrows the left neural foramen and slightly compresses the left posterior lateral aspect of the thecal sac but does not compress the spinal cord. There is a subtle pathologic compression fracture of the right side of the superior endplate of T5. No protrusion of bone or tumor or disc material into the spinal canal. No other pathologic fractures no visible epidural tumor.  IMPRESSION: 1. Extensive metastatic disease throughout the thoracic spine with a subtle pathologic fracture of the superior aspect of T5. 2. No epidural tumor or neural impingement.   Electronically Signed   By: Rozetta Nunnery M.D.   On: 08/29/2013 13:57   Mr Lumbar Spine W Wo Contrast  08/29/2013   CLINICAL DATA:  Widespread metastatic disease to the bones.  EXAM: MRI LUMBAR SPINE WITHOUT AND WITH CONTRAST  TECHNIQUE: Multiplanar and multiecho pulse sequences of the lumbar spine were obtained without and with intravenous contrast.  CONTRAST:  48mL MULTIHANCE GADOBENATE DIMEGLUMINE 529 MG/ML IV SOLN  COMPARISON:  CT scan dated 08/28/2013   FINDINGS: Conus tip is at the inferior aspect of L2.  Metastatic disease extensively involves the entire lumbar spine as well as the sacrum in the adjacent iliac bones. There is a pathologic compression fracture of the L3 vertebra with protrusion of the posterior aspect of the infiltrated vertebral body into the spinal canal compressing the left side of the thecal sac in the left lateral recess. The tumor extends into the left pedicle, lamina, and facets and also extends into the left neural foramen at L3-4.  There is a slight compression deformity of the central portion of the L1 vertebra but there is no protrusion of bone or tumor or disc into the spinal canal at that level.  There is degenerative disc disease at L5-S1 with disc space narrowing and a 4 mm retrolisthesis with a small broad-based disc bulge with accompanying osteophytes. This touches both S1 nerves but does not compress them. There is fairly severe left foraminal stenosis at L5-S1.  There is diffuse edema in the paraspinal musculature.  After contrast administration there numerous metastases enhance.  IMPRESSION: 1. Diffuse metastatic disease throughout the spine with a compression fracture of L3. Infiltrated bone protrudes into the spinal canal and compresses the left lateral recess in the left neural foramen at L3-4. 2. Slight pathologic compression fracture of the central portion of L1 without neural impingement. 3. There is no definitive epidural tumor.   Electronically Signed   By: Rozetta Nunnery M.D.   On: 08/29/2013 13:40    Medications:  Prior to Admission:  Prescriptions prior to admission  Medication Sig Dispense Refill  . acetaminophen (TYLENOL) 500 MG tablet Take 1,000 mg by mouth every 6 (six) hours as needed for moderate pain.      . diazepam (VALIUM) 10 MG tablet Take 10 mg by mouth every 6 (six) hours as needed. For muscle spasms      . DULoxetine (CYMBALTA) 60 MG capsule Take 60  mg by mouth at bedtime.      . gabapentin  (NEURONTIN) 600 MG tablet Take 1,200 mg by mouth 3 (three) times daily.      Marland Kitchen levothyroxine (SYNTHROID, LEVOTHROID) 75 MCG tablet Take 75 mcg by mouth See admin instructions. Takes daily except Monday and Thursday      . oxyCODONE-acetaminophen (PERCOCET/ROXICET) 5-325 MG per tablet Take 1-2 tablets by mouth every 6 (six) hours as needed for severe pain.  20 tablet  0  . rizatriptan (MAXALT) 10 MG tablet Take 10 mg by mouth as needed for migraine. May repeat in 2 hours if needed      . simvastatin (ZOCOR) 40 MG tablet Take 40 mg by mouth daily.      Marland Kitchen tiZANidine (ZANAFLEX) 4 MG tablet Take 8 mg by mouth 3 (three) times daily.      Marland Kitchen topiramate (TOPAMAX) 100 MG tablet Take 100 mg by mouth at bedtime.       Scheduled: . dexamethasone  4 mg Intravenous 4 times per day  . DULoxetine  60 mg Oral QHS  . feeding supplement (ENSURE COMPLETE)  237 mL Oral BID BM  . gabapentin  1,200 mg Oral TID  . levothyroxine  75 mcg Oral Once per day on Sun Tue Wed Fri Sat  . simvastatin  40 mg Oral Daily  . topiramate  100 mg Oral QHS   Continuous: . sodium chloride 1,000 mL (08/31/13 0240)  . 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 Per neurosurgery no plans for surgical intervention. Radiation oncology recommends tissue biopsy and then begin XRT. Active Problems:   Fracture of L3 vertebra   Acute encephalopathy   Protein-calorie malnutrition, severe   Hypercalcemia, improved   Pulmonary embolus, stable on IV heparin     LOS: 4 days   Nida Manfredi D 08/31/2013, 9:21 AM

## 2013-08-31 NOTE — Progress Notes (Signed)
ANTICOAGULATION CONSULT NOTE - Follow Up Consult  Pharmacy Consult for heparin Indication: pulmonary embolus  Labs:  Recent Labs  08/29/13 0350  08/29/13 1655 08/30/13 0250 08/31/13 0250  HGB 10.4*  --   --  10.1* 9.6*  HCT 33.3*  --   --  31.8* 30.6*  PLT 378  --   --  374 382  HEPARINUNFRC  --   < > 0.68 0.67 0.29*  CREATININE 0.40*  --   --  0.38*  --   < > = values in this interval not displayed.   Assessment: 58yo female now slightly subtherapeutic on heparin after several levels at higher end of goal.  Goal of Therapy:  Heparin level 0.3-0.7 units/ml   Plan:  Will increase heparin gtt very slightly to 1150 units/hr and check level in 6hr to ensure doesn't keep trending down.  Wynona Neat, PharmD, BCPS  08/31/2013,3:48 AM

## 2013-08-31 NOTE — Progress Notes (Signed)
Patient ID: Kristie Jackson, female   DOB: 01-Jan-1956, 58 y.o.   MRN: [id number]  Imaging reviewed.  Most accessible metastatic lesion is a lesion to right iliac bone and acetabulum.  Will schedule for CT guided biopsy of this lesion tomorrow. Discussed with Dr. Delfina Redwood. Will see in AM and obtain consent for biopsy. NPO after MN.

## 2013-08-31 NOTE — Progress Notes (Signed)
ANTICOAGULATION CONSULT NOTE - Follow Up Consult   Pharmacy Consult for heparin Indication: pulmonary embolus  Allergies  Allergen Reactions  . Morphine And Related Nausea And Vomiting    Patient Measurements: Height: 5\' 8"  (172.7 cm) Weight: 134 lb 14.7 oz (61.2 kg) IBW/kg (Calculated) : 63.9  Vital Signs: Temp: 98.1 F (36.7 C) (07/19 1039) Temp src: Oral (07/19 1039) BP: 134/81 mmHg (07/19 1039) Pulse Rate: 55 (07/19 1039)  Labs:  Recent Labs  08/29/13 0350  08/30/13 0250 08/31/13 0250 08/31/13 0950  HGB 10.4*  --  10.1* 9.6*  --   HCT 33.3*  --  31.8* 30.6*  --   PLT 378  --  374 382  --   HEPARINUNFRC  --   < > 0.67 0.29* 0.43  CREATININE 0.40*  --  0.38* 0.38*  --   < > = values in this interval not displayed.  Estimated Creatinine Clearance: 75 ml/min (by C-G formula based on Cr of 0.38).   Medications:  Scheduled:  . dexamethasone  4 mg Intravenous 4 times per day  . DULoxetine  60 mg Oral QHS  . feeding supplement (ENSURE COMPLETE)  237 mL Oral BID BM  . gabapentin  1,200 mg Oral TID  . levothyroxine  75 mcg Oral Once per day on Sun Tue Wed Fri Sat  . simvastatin  40 mg Oral Daily  . topiramate  100 mg Oral QHS   Infusions:  . sodium chloride 1,000 mL (08/31/13 0240)  . heparin 1,150 Units/hr (08/31/13 0358)    Assessment: 58 yo F with a significant PMH consisting of spinal cord injury with semi-functional paraplegia.  Patient presented to ED on 7/15 with a temperature of 101.5 at home. Patient admitted for concerns of sepsis and encephalopathy. Patient also has recent diagnosis of metastatic cancer. CT revealed PE to RLL.  Pharmacy consulted to dose heparin. HL is again therapeutic after slight increase in dose this am, H/H slowly trending down with hgb now 9.6, plt wnl and stable, no reported s/s bleeding.  Goal of Therapy:  Heparin level 0.3-0.7 units/ml Monitor platelets by anticoagulation protocol: Yes   Plan:  - Continue heparin infusion  at 1150 units/hr - Daily HL - Monitor CBC, s/s bleeding  Harolyn Rutherford, PharmD Clinical Pharmacist - Resident Pager: [phone] Pharmacy: [phone] 08/31/2013 11:01 AM

## 2013-09-01 ENCOUNTER — Inpatient Hospital Stay (HOSPITAL_COMMUNITY): Payer: Medicare Other

## 2013-09-01 LAB — CBC WITH DIFFERENTIAL/PLATELET
BASOS PCT: 0 % (ref 0–1)
Basophils Absolute: 0 10*3/uL (ref 0.0–0.1)
Eosinophils Absolute: 0 10*3/uL (ref 0.0–0.7)
Eosinophils Relative: 0 % (ref 0–5)
HCT: 31.3 % — ABNORMAL LOW (ref 36.0–46.0)
Hemoglobin: 9.8 g/dL — ABNORMAL LOW (ref 12.0–15.0)
Lymphocytes Relative: 30 % (ref 12–46)
Lymphs Abs: 3.3 10*3/uL (ref 0.7–4.0)
MCH: 29.1 pg (ref 26.0–34.0)
MCHC: 31.3 g/dL (ref 30.0–36.0)
MCV: 92.9 fL (ref 78.0–100.0)
MONOS PCT: 8 % (ref 3–12)
Monocytes Absolute: 0.9 10*3/uL (ref 0.1–1.0)
NEUTROS PCT: 62 % (ref 43–77)
Neutro Abs: 6.9 10*3/uL (ref 1.7–7.7)
PLATELETS: 392 10*3/uL (ref 150–400)
RBC: 3.37 MIL/uL — ABNORMAL LOW (ref 3.87–5.11)
RDW: 14.3 % (ref 11.5–15.5)
WBC: 11.1 10*3/uL — ABNORMAL HIGH (ref 4.0–10.5)

## 2013-09-01 LAB — PROTEIN ELECTROPHORESIS, SERUM
ALBUMIN ELP: 44.5 % — AB (ref 55.8–66.1)
Alpha-1-Globulin: 9.9 % — ABNORMAL HIGH (ref 2.9–4.9)
Alpha-2-Globulin: 19 % — ABNORMAL HIGH (ref 7.1–11.8)
BETA 2: 5.9 % (ref 3.2–6.5)
Beta Globulin: 6.9 % (ref 4.7–7.2)
GAMMA GLOBULIN: 13.8 % (ref 11.1–18.8)
M-Spike, %: NOT DETECTED g/dL
Total Protein ELP: 5.3 g/dL — ABNORMAL LOW (ref 6.0–8.3)

## 2013-09-01 LAB — PROCALCITONIN

## 2013-09-01 LAB — COMPREHENSIVE METABOLIC PANEL
ALK PHOS: 103 U/L (ref 39–117)
ALT: 50 U/L — AB (ref 0–35)
AST: 39 U/L — ABNORMAL HIGH (ref 0–37)
Albumin: 2.4 g/dL — ABNORMAL LOW (ref 3.5–5.2)
Anion gap: 13 (ref 5–15)
BUN: 13 mg/dL (ref 6–23)
CO2: 24 meq/L (ref 19–32)
Calcium: 9.6 mg/dL (ref 8.4–10.5)
Chloride: 105 mEq/L (ref 96–112)
Creatinine, Ser: 0.38 mg/dL — ABNORMAL LOW (ref 0.50–1.10)
GLUCOSE: 96 mg/dL (ref 70–99)
POTASSIUM: 4 meq/L (ref 3.7–5.3)
SODIUM: 142 meq/L (ref 137–147)
Total Bilirubin: <0.2 mg/dL — ABNORMAL LOW (ref 0.3–1.2)
Total Protein: 6 g/dL (ref 6.0–8.3)

## 2013-09-01 LAB — HEPARIN LEVEL (UNFRACTIONATED): HEPARIN UNFRACTIONATED: 0.4 [IU]/mL (ref 0.30–0.70)

## 2013-09-01 LAB — PROTIME-INR
INR: 0.99 (ref 0.00–1.49)
PROTHROMBIN TIME: 13.1 s (ref 11.6–15.2)

## 2013-09-01 MED ORDER — SODIUM CHLORIDE 0.9 % IV SOLN
1000.0000 mL | INTRAVENOUS | Status: DC
Start: 2013-09-01 — End: 2013-09-03
  Administered 2013-09-01 – 2013-09-02 (×3): 1000 mL via INTRAVENOUS

## 2013-09-01 MED ORDER — ONDANSETRON HCL 4 MG/2ML IJ SOLN: 4.0000 mg | Freq: Once | INTRAMUSCULAR | Status: DC

## 2013-09-01 MED ORDER — ONDANSETRON HCL 4 MG/2ML IJ SOLN
INTRAMUSCULAR | Status: AC | PRN
  Administered 2013-09-01: 4 mg via INTRAVENOUS

## 2013-09-01 MED ORDER — ONDANSETRON HCL 4 MG/2ML IJ SOLN
INTRAMUSCULAR | Status: AC
  Filled 2013-09-01: qty 2

## 2013-09-01 MED ORDER — MIDAZOLAM HCL 2 MG/2ML IJ SOLN
INTRAMUSCULAR | Status: AC
  Filled 2013-09-01: qty 4

## 2013-09-01 MED ORDER — LIDOCAINE HCL 1 % IJ SOLN
INTRAMUSCULAR | Status: AC
  Filled 2013-09-01: qty 10

## 2013-09-01 MED ORDER — FENTANYL CITRATE 0.05 MG/ML IJ SOLN
INTRAMUSCULAR | Status: AC | PRN
  Administered 2013-09-01: 50 ug via INTRAVENOUS
  Administered 2013-09-01 (×2): 25 ug via INTRAVENOUS

## 2013-09-01 MED ORDER — MIDAZOLAM HCL 2 MG/2ML IJ SOLN
INTRAMUSCULAR | Status: AC | PRN
  Administered 2013-09-01 (×2): 0.5 mg via INTRAVENOUS
  Administered 2013-09-01: 1 mg via INTRAVENOUS

## 2013-09-01 MED ORDER — FENTANYL CITRATE 0.05 MG/ML IJ SOLN
INTRAMUSCULAR | Status: AC
  Filled 2013-09-01: qty 2

## 2013-09-01 MED ORDER — SODIUM CHLORIDE 0.9 % IV SOLN
INTRAVENOUS | Status: AC | PRN
  Administered 2013-09-01: 50 mL/h via INTRAVENOUS

## 2013-09-01 MED ORDER — SENNOSIDES-DOCUSATE SODIUM 8.6-50 MG PO TABS
2.0000 | ORAL_TABLET | Freq: Every day | ORAL | Status: DC
  Administered 2013-09-01: 2 via ORAL
  Filled 2013-09-01 (×6): qty 2

## 2013-09-01 MED ORDER — HEPARIN (PORCINE) IN NACL 100-0.45 UNIT/ML-% IJ SOLN
1150.0000 [IU]/h | INTRAMUSCULAR | Status: DC
  Administered 2013-09-01: 1150 [IU]/h via INTRAVENOUS
  Filled 2013-09-01: qty 250

## 2013-09-01 NOTE — Care Management Note (Addendum)
    Page 1 of 2   09/05/2013     10:54:13 AM CARE MANAGEMENT NOTE 09/05/2013  Patient:  Kristie Jackson, Kristie Jackson   Account Number:  [id number]  Date Initiated:  09/01/2013  Documentation initiated by:  Elissa Hefty  Subjective/Objective Assessment:   adm w mets from breast ca     Action/Plan:   lives alone, pcp dr Maxwell Caul   Anticipated DC Date:  09/05/2013   Anticipated DC Plan:  [place]  CM consult      [place]   Choice offered to / List presented to:  C-1 Patient   DME arranged  [place]      DME agency  [place] arranged  [place].   Status of service:  Completed, signed off Medicare Important Message given?  YES (If response is "NO", the following Medicare IM given date fields will be blank) Date Medicare IM given:  09/01/2013 Medicare IM given by:  Elissa Hefty Date Additional Medicare IM given:  09/04/2013 Additional Medicare IM given by:  [facility] JEFFRIES  Discharge Disposition:  [place]  Per UR Regulation:  Reviewed for med. necessity/level of care/duration of stay  If discussed at [place] of Stay Meetings, dates discussed:   09/02/2013    Comments:  09/05/13 Shamra Bradeen RN,BSN NCM [phone] RECEIVED CALL FROM SW FOR POSSIBLE NEED FOR HOYER LIFT.[facility] DME REP MADE AWARE.WILL AWAIT HOME DME ORDER.  09/04/13 12:00 CM met with pt in room to offer choice as pt has decided to go home with HHPT/OT rather than go to a SNF.  Pt chooses [facility] to render [facility] services.  Address and contact informaiton verified with pt.  Referral texted to [facility] rep, Kristen for HHPT/OT. DME rep to deliver the wheelchair with back and seat cushion to pt's room prior to discharge.  Pt states,with the new wheelchair, she has no transportation problem.  [facility] is the service she uses which is  equipped with wheelchair access. Pt has 24-7 private aides and states she feels good about going home.  No other CM needs were communicated.  Mariane Masters, BSN, [place] [phone].     09/03/13 08:00 LATE ENTRY: CM confirmed disposition plans with pt; CSW to arrange SNF.  CSW notified.  No other CMneeds were communicated.  Mariane Masters, BSN, [place].  09/02/13 1500 Henrietta Mayo RN MSN BSN CCM Per PT, pt refused eval because of pain.  Dr Laurann Montana concerned that pt needs PT and OT eval for eventual transfer to SNF for rehab.  Immediate plan is for pt to xfer to [facility] for radiation tx.  Discussed insurance requirements for rehab bed @ SNF - pt demonstrates understanding but reiterates that she will not participate until pain is alleviated.  Also indicates that her future plan will be entirely dependent on bx results.  Requested that RN administer IV pain medication immediately preceding transfer.

## 2013-09-01 NOTE — Procedures (Signed)
Successful RT ILIAC BONE LESION CORE BX NO COMP STABLE PATH PENDING FULL REPORT IN PACS

## 2013-09-01 NOTE — Progress Notes (Signed)
Subjective: Still with significant pain in back and R hip and leg.  No BM 3 days.  Objective: Vital signs in last 24 hours: Temp:  [97.2 F (36.2 C)-98.4 F (36.9 C)] 98.4 F (36.9 C) (07/20 0442) Pulse Rate:  [47-67] 47 (07/20 0442) Resp:  [10-18] 14 (07/20 0442) BP: (124-135)/(65-87) 131/79 mmHg (07/20 0442) SpO2:  [95 %-98 %] 98 % (07/20 0442) Weight change:  Last BM Date: 08/29/13  Intake/Output from previous day: 07/19 0701 - 07/20 0700 In: 3909 [P.O.:360; I.V.:3549] Out: -  Intake/Output this shift:    General appearance: alert and cooperative Resp: clear to auscultation bilaterally Cardio: regular rate and rhythm, S1, S2 normal, no murmur, click, rub or gallop GI: soft, non-tender; bowel sounds normal; no masses,  no organomegaly Neurologic: Motor: legs 3+/5  Lab Results:  Recent Labs  08/31/13 0250 09/01/13 0346  WBC 8.5 11.1*  HGB 9.6* 9.8*  HCT 30.6* 31.3*  PLT 382 392   BMET  Recent Labs  08/31/13 0250 09/01/13 0346  NA 145 142  K 4.1 4.0  CL 107 105  CO2 24 24  GLUCOSE 110* 96  BUN 14 13  CREATININE 0.38* 0.38*  CALCIUM 9.5 9.6    Studies/Results: No results found.  Medications: I have reviewed the patient's current medications.  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 Per neurosurgery no plans for surgical intervention. Radiation oncology recommends tissue biopsy and then begin XRT. Biopsy of pelvic lesion today by IR Active Problems:  Pain control, doing well with prn oxycodone/tylenol.  Add senekot S Leg weakness, out of proportion to spinal disease, she had old spinal injury but definate increase in leg weakness over last months, ?paraneoplastic syndrome Protein-calorie malnutrition, severe  Hypercalcemia, improved.  Decrease normal saline Pulmonary embolus, stable on IV heparin. Long term anticoagulation with LMWH  vs coumadin, oncology opinion? Disposition  PT and OT  Today, suspect she will need ST-NHP during treatment    LOS: 5 days   Kristie Jackson 09/01/2013, 7:57 AM

## 2013-09-01 NOTE — Progress Notes (Addendum)
ANTICOAGULATION CONSULT NOTE - Follow Up Consult  Pharmacy Consult for Heparin Indication: pulmonary embolus  Allergies  Allergen Reactions  . Morphine And Related Nausea And Vomiting    Patient Measurements: Height: 5\' 8"  (172.7 cm) Weight: 134 lb 14.7 oz (61.2 kg) IBW/kg (Calculated) : 63.9 Heparin Dosing Weight: 61.2 kg  Vital Signs: Temp: 98.2 F (36.8 C) (07/20 0908) Temp src: Oral (07/20 0908) BP: 160/101 mmHg (07/20 0908) Pulse Rate: 46 (07/20 0908)  Labs:  Recent Labs  08/30/13 0250 08/31/13 0250 08/31/13 0950 09/01/13 0346  HGB 10.1* 9.6*  --  9.8*  HCT 31.8* 30.6*  --  31.3*  PLT 374 382  --  392  LABPROT  --   --   --  13.1  INR  --   --   --  0.99  HEPARINUNFRC 0.67 0.29* 0.43 0.40  CREATININE 0.38* 0.38*  --  0.38*    Estimated Creatinine Clearance: 75 ml/min (by C-G formula based on Cr of 0.38).  Assessment:  Heparin level therapeutic this am (0.40) on 1150 units/hr.  Drip off ~8am for pelvic biopsy later today.  Goal of Therapy:  Heparin level 0.3-0.7 units/ml Monitor platelets by anticoagulation protocol: Yes   Plan:   Will follow up post-biopsy for timing of resuming IV heparin.  Arty Baumgartner, [place] Pager: [phone] 09/01/2013,11:28 AM  Addendum:  - RN reports that IV heparin okay to resume 2 hrs post-biopsy, per Dr. Annamaria Boots.  - will resume at 6pm at prior rate of 1150 units/hr.  - next heparin level and CBC in am.  Consuello Masse, RPh 09/01/2013 5:06 PM

## 2013-09-01 NOTE — H&P (Signed)
Kristie Jackson is an 58 y.o. female.   Chief Complaint: Pt comes to ED with weakness and Leg pain Noted weight loss Noted Rt breast mass and nipple discharge No recent mammo Work up reveals Rt breast mass L3 lesion; multiple metastasis including spine; skull; lung Request for consult for biopsy for tissue diagnosis per Dr Delfina Redwood Dr Annamaria Boots and Dr Kathlene Cote have reviewed imaging Pt has been seen and examined Pt now scheduled for Rt iliac/acetabular lesion biopsy  HPI: smoker; HLD; spinal cord injury  Past Medical History  Diagnosis Date  . Central hypothyroidism   . Hyperlipidemia   . Spinal cord injury   . Depression   . Cancer     spinal metastasis    Past Surgical History  Procedure Laterality Date  . Laminectomy    . Abdominal hysterectomy      No family history on file. Social History:  reports that she has been smoking Cigarettes.  She has been smoking about 1.00 pack per day. She does not have any smokeless tobacco history on file. She reports that she drinks alcohol. She reports that she does not use illicit drugs.  Allergies:  Allergies  Allergen Reactions  . Morphine And Related Nausea And Vomiting    Medications Prior to Admission  Medication Sig Dispense Refill  . acetaminophen (TYLENOL) 500 MG tablet Take 1,000 mg by mouth every 6 (six) hours as needed for moderate pain.      . diazepam (VALIUM) 10 MG tablet Take 10 mg by mouth every 6 (six) hours as needed. For muscle spasms      . DULoxetine (CYMBALTA) 60 MG capsule Take 60 mg by mouth at bedtime.      . gabapentin (NEURONTIN) 600 MG tablet Take 1,200 mg by mouth 3 (three) times daily.      Marland Kitchen levothyroxine (SYNTHROID, LEVOTHROID) 75 MCG tablet Take 75 mcg by mouth See admin instructions. Takes daily except Monday and Thursday      . oxyCODONE-acetaminophen (PERCOCET/ROXICET) 5-325 MG per tablet Take 1-2 tablets by mouth every 6 (six) hours as needed for severe pain.  20 tablet  0  . rizatriptan (MAXALT) 10 MG  tablet Take 10 mg by mouth as needed for migraine. May repeat in 2 hours if needed      . simvastatin (ZOCOR) 40 MG tablet Take 40 mg by mouth daily.      Marland Kitchen tiZANidine (ZANAFLEX) 4 MG tablet Take 8 mg by mouth 3 (three) times daily.      Marland Kitchen topiramate (TOPAMAX) 100 MG tablet Take 100 mg by mouth at bedtime.        Results for orders placed during the hospital encounter of 08/27/13 (from the past 48 hour(s))  COMPREHENSIVE METABOLIC PANEL     Status: Abnormal   Collection Time    08/31/13  2:50 AM      Result Value Ref Range   Sodium 145  137 - 147 mEq/L   Potassium 4.1  3.7 - 5.3 mEq/L   Chloride 107  96 - 112 mEq/L   CO2 24  19 - 32 mEq/L   Glucose, Bld 110 (*) 70 - 99 mg/dL   BUN 14  6 - 23 mg/dL   Creatinine, Ser 0.38 (*) 0.50 - 1.10 mg/dL   Calcium 9.5  8.4 - 10.5 mg/dL   Total Protein 6.1  6.0 - 8.3 g/dL   Albumin 2.4 (*) 3.5 - 5.2 g/dL   AST 19  0 - 37 U/L   ALT  20  0 - 35 U/L   Alkaline Phosphatase 102  39 - 117 U/L   Total Bilirubin <0.2 (*) 0.3 - 1.2 mg/dL   GFR calc non Af Amer >90  >90 mL/min   GFR calc Af Amer >90  >90 mL/min   Comment: (NOTE)     The eGFR has been calculated using the CKD EPI equation.     This calculation has not been validated in all clinical situations.     eGFR's persistently <90 mL/min signify possible Chronic Kidney     Disease.   Anion gap 14  5 - 15  CBC WITH DIFFERENTIAL     Status: Abnormal   Collection Time    08/31/13  2:50 AM      Result Value Ref Range   WBC 8.5  4.0 - 10.5 K/uL   RBC 3.31 (*) 3.87 - 5.11 MIL/uL   Hemoglobin 9.6 (*) 12.0 - 15.0 g/dL   HCT 30.6 (*) 36.0 - 46.0 %   MCV 92.4  78.0 - 100.0 fL   MCH 29.0  26.0 - 34.0 pg   MCHC 31.4  30.0 - 36.0 g/dL   RDW 14.2  11.5 - 15.5 %   Platelets 382  150 - 400 K/uL   Neutrophils Relative % 66  43 - 77 %   Neutro Abs 5.6  1.7 - 7.7 K/uL   Lymphocytes Relative 26  12 - 46 %   Lymphs Abs 2.2  0.7 - 4.0 K/uL   Monocytes Relative 8  3 - 12 %   Monocytes Absolute 0.6  0.1 -  1.0 K/uL   Eosinophils Relative 0  0 - 5 %   Eosinophils Absolute 0.0  0.0 - 0.7 K/uL   Basophils Relative 0  0 - 1 %   Basophils Absolute 0.0  0.0 - 0.1 K/uL  HEPARIN LEVEL (UNFRACTIONATED)     Status: Abnormal   Collection Time    08/31/13  2:50 AM      Result Value Ref Range   Heparin Unfractionated 0.29 (*) 0.30 - 0.70 IU/mL   Comment:            IF HEPARIN RESULTS ARE BELOW     EXPECTED VALUES, AND PATIENT     DOSAGE HAS BEEN CONFIRMED,     SUGGEST FOLLOW UP TESTING     OF ANTITHROMBIN III LEVELS.  HEPARIN LEVEL (UNFRACTIONATED)     Status: None   Collection Time    08/31/13  9:50 AM      Result Value Ref Range   Heparin Unfractionated 0.43  0.30 - 0.70 IU/mL   Comment:            IF HEPARIN RESULTS ARE BELOW     EXPECTED VALUES, AND PATIENT     DOSAGE HAS BEEN CONFIRMED,     SUGGEST FOLLOW UP TESTING     OF ANTITHROMBIN III LEVELS.  COMPREHENSIVE METABOLIC PANEL     Status: Abnormal   Collection Time    09/01/13  3:46 AM      Result Value Ref Range   Sodium 142  137 - 147 mEq/L   Potassium 4.0  3.7 - 5.3 mEq/L   Chloride 105  96 - 112 mEq/L   CO2 24  19 - 32 mEq/L   Glucose, Bld 96  70 - 99 mg/dL   BUN 13  6 - 23 mg/dL   Creatinine, Ser 0.38 (*) 0.50 - 1.10 mg/dL   Calcium 9.6  8.4 - 10.5 mg/dL   Total Protein 6.0  6.0 - 8.3 g/dL   Albumin 2.4 (*) 3.5 - 5.2 g/dL   AST 39 (*) 0 - 37 U/L   ALT 50 (*) 0 - 35 U/L   Alkaline Phosphatase 103  39 - 117 U/L   Total Bilirubin <0.2 (*) 0.3 - 1.2 mg/dL   GFR calc non Af Amer >90  >90 mL/min   GFR calc Af Amer >90  >90 mL/min   Comment: (NOTE)     The eGFR has been calculated using the CKD EPI equation.     This calculation has not been validated in all clinical situations.     eGFR's persistently <90 mL/min signify possible Chronic Kidney     Disease.   Anion gap 13  5 - 15  CBC WITH DIFFERENTIAL     Status: Abnormal   Collection Time    09/01/13  3:46 AM      Result Value Ref Range   WBC 11.1 (*) 4.0 - 10.5 K/uL    RBC 3.37 (*) 3.87 - 5.11 MIL/uL   Hemoglobin 9.8 (*) 12.0 - 15.0 g/dL   HCT 31.3 (*) 36.0 - 46.0 %   MCV 92.9  78.0 - 100.0 fL   MCH 29.1  26.0 - 34.0 pg   MCHC 31.3  30.0 - 36.0 g/dL   RDW 14.3  11.5 - 15.5 %   Platelets 392  150 - 400 K/uL   Neutrophils Relative % 62  43 - 77 %   Neutro Abs 6.9  1.7 - 7.7 K/uL   Lymphocytes Relative 30  12 - 46 %   Lymphs Abs 3.3  0.7 - 4.0 K/uL   Monocytes Relative 8  3 - 12 %   Monocytes Absolute 0.9  0.1 - 1.0 K/uL   Eosinophils Relative 0  0 - 5 %   Eosinophils Absolute 0.0  0.0 - 0.7 K/uL   Basophils Relative 0  0 - 1 %   Basophils Absolute 0.0  0.0 - 0.1 K/uL  HEPARIN LEVEL (UNFRACTIONATED)     Status: None   Collection Time    09/01/13  3:46 AM      Result Value Ref Range   Heparin Unfractionated 0.40  0.30 - 0.70 IU/mL   Comment:            IF HEPARIN RESULTS ARE BELOW     EXPECTED VALUES, AND PATIENT     DOSAGE HAS BEEN CONFIRMED,     SUGGEST FOLLOW UP TESTING     OF ANTITHROMBIN III LEVELS.  PROCALCITONIN     Status: None   Collection Time    09/01/13  3:46 AM      Result Value Ref Range   Procalcitonin <0.10     Comment:            Interpretation:     PCT (Procalcitonin) <= 0.5 ng/mL:     Systemic infection (sepsis) is not likely.     Local bacterial infection is possible.     (NOTE)             ICU PCT Algorithm               Non ICU PCT Algorithm        ----------------------------     ------------------------------             PCT < 0.25 ng/mL  PCT < 0.1 ng/mL         Stopping of antibiotics            Stopping of antibiotics           strongly encouraged.               strongly encouraged.        ----------------------------     ------------------------------           PCT level decrease by               PCT < 0.25 ng/mL           >= 80% from peak PCT           OR PCT 0.25 - 0.5 ng/mL          Stopping of antibiotics                                                 encouraged.         Stopping of  antibiotics               encouraged.        ----------------------------     ------------------------------           PCT level decrease by              PCT >= 0.25 ng/mL           < 80% from peak PCT            AND PCT >= 0.5 ng/mL            Continuing antibiotics                                                  encouraged.           Continuing antibiotics                encouraged.        ----------------------------     ------------------------------         PCT level increase compared          PCT > 0.5 ng/mL             with peak PCT AND              PCT >= 0.5 ng/mL             Escalation of antibiotics                                              strongly encouraged.          Escalation of antibiotics            strongly encouraged.  PROTIME-INR     Status: None   Collection Time    09/01/13  3:46 AM      Result Value Ref Range   Prothrombin Time 13.1  11.6 - 15.2 seconds   INR 0.99  0.00 - 1.49   No results found.  Review of Systems  Constitutional: Positive for weight loss. Negative for fever.  Respiratory: Negative for cough and shortness of breath.   Cardiovascular: Positive for chest pain.       Chest wall pain  Gastrointestinal: Positive for abdominal pain. Negative for nausea and vomiting.  Musculoskeletal: Positive for back pain and joint pain.  Neurological: Positive for weakness. Negative for dizziness and headaches.  Psychiatric/Behavioral: Positive for substance abuse.    Blood pressure 152/93, pulse 45, temperature 98.4 F (36.9 C), temperature source Oral, resp. rate 16, height 5' 8"  (1.727 m), weight 61.2 kg (134 lb 14.7 oz), SpO2 99.00%. Physical Exam  Constitutional: She is oriented to person, place, and time. She appears well-developed.  Cardiovascular: Normal rate.   No murmur heard. Respiratory: Effort normal and breath sounds normal. She has no wheezes.  GI: Soft. There is tenderness.  Musculoskeletal: Normal range of motion.  Neurological:  She is alert and oriented to person, place, and time.  Skin: Skin is warm and dry.  Psychiatric: She has a normal mood and affect. Her behavior is normal. Judgment and thought content normal.     Assessment/Plan Pt with wt loss Weakness; leg pain Work up reveals many metastasis sites Now scheduled for Rt iliac/acetabular lesion bx Pt aware of procedure benefits and risks and agreeable to proceed Consent signed andin chart Hep stopped 8am   Jannette Cotham A 09/01/2013, 9:04 AM

## 2013-09-01 NOTE — Progress Notes (Signed)
  Radiation Oncology         [phone]) [phone] ________________________________  Name: Kristie Jackson  MRN: [id number]  Date: 08/27/2013  DOB: 27-Aug-1955  Chart Note:  Appreciate Interventional Radiology effort to confirm malignancy.  Following biopsy, I would recommend transfer to [facility] in anticipation for radiotherapy.  ________________________________  Sheral Apley. Tammi Klippel, M.D.

## 2013-09-01 NOTE — Consult Note (Signed)
ADDENDUM:  Patient seen and examined by me and agree with above findings. Patient is a 30 year with probable metastatic breast cancer based on imaging (the spine, right acetabulum with cord compression, to the brain, axillary, supraclavicular LN, liver) admitted with right leg pain and fever. Radiation oncology (Dr. Tammi Klippel) consulted and recommended neurosurgery evaluation. Patient will require biopsy for tissue diagnosis and ER/PR/HER 2 testing. Heparin gtt for PE with transition to lovenox s/p procedure. We will make more specific recommendations once tissue biopsy results obtained. We will follow this patient with you.  I personally saw this patient and performed a substantive portion of this encounter with the listed APP documented above.  Lanetra Hartley, MD

## 2013-09-02 DIAGNOSIS — C50919 Malignant neoplasm of unspecified site of unspecified female breast: Secondary | ICD-10-CM

## 2013-09-02 DIAGNOSIS — D638 Anemia in other chronic diseases classified elsewhere: Secondary | ICD-10-CM

## 2013-09-02 DIAGNOSIS — I2699 Other pulmonary embolism without acute cor pulmonale: Secondary | ICD-10-CM

## 2013-09-02 LAB — CBC WITH DIFFERENTIAL/PLATELET
BASOS PCT: 0 % (ref 0–1)
Basophils Absolute: 0 10*3/uL (ref 0.0–0.1)
EOS PCT: 0 % (ref 0–5)
Eosinophils Absolute: 0 10*3/uL (ref 0.0–0.7)
HCT: 33.1 % — ABNORMAL LOW (ref 36.0–46.0)
HEMOGLOBIN: 10.2 g/dL — AB (ref 12.0–15.0)
LYMPHS ABS: 3.3 10*3/uL (ref 0.7–4.0)
Lymphocytes Relative: 21 % (ref 12–46)
MCH: 29 pg (ref 26.0–34.0)
MCHC: 30.8 g/dL (ref 30.0–36.0)
MCV: 94 fL (ref 78.0–100.0)
MONOS PCT: 8 % (ref 3–12)
Monocytes Absolute: 1.3 10*3/uL — ABNORMAL HIGH (ref 0.1–1.0)
Neutro Abs: 11.1 10*3/uL — ABNORMAL HIGH (ref 1.7–7.7)
Neutrophils Relative %: 71 % (ref 43–77)
Platelets: 399 10*3/uL (ref 150–400)
RBC: 3.52 MIL/uL — AB (ref 3.87–5.11)
RDW: 14.8 % (ref 11.5–15.5)
WBC: 15.6 10*3/uL — ABNORMAL HIGH (ref 4.0–10.5)

## 2013-09-02 LAB — HEPARIN LEVEL (UNFRACTIONATED): Heparin Unfractionated: 0.34 IU/mL (ref 0.30–0.70)

## 2013-09-02 MED ORDER — POLYETHYLENE GLYCOL 3350 17 G PO PACK
17.0000 g | PACK | Freq: Every day | ORAL | Status: DC | PRN
  Filled 2013-09-02: qty 1

## 2013-09-02 MED ORDER — ENOXAPARIN SODIUM 60 MG/0.6ML ~~LOC~~ SOLN
60.0000 mg | Freq: Two times a day (BID) | SUBCUTANEOUS | Status: DC
  Administered 2013-09-02 – 2013-09-05 (×7): 60 mg via SUBCUTANEOUS
  Filled 2013-09-02 (×10): qty 0.6

## 2013-09-02 MED ORDER — GABAPENTIN 400 MG PO CAPS
1200.0000 mg | ORAL_CAPSULE | Freq: Three times a day (TID) | ORAL | Status: DC
  Administered 2013-09-02 – 2013-09-05 (×8): 1200 mg via ORAL
  Filled 2013-09-02 (×10): qty 3

## 2013-09-02 NOTE — Progress Notes (Signed)
Subjective: No BM.  No new complaints  Objective: Vital signs in last 24 hours: Temp:  [98 F (36.7 C)-98.4 F (36.9 C)] 98 F (36.7 C) (07/21 0448) Pulse Rate:  [43-90] 90 (07/20 1900) Resp:  [12-23] 17 (07/20 1900) BP: (111-166)/(54-101) 133/78 mmHg (07/21 0448) SpO2:  [93 %-99 %] 95 % (07/20 1900) Weight:  [61 kg (134 lb 7.7 oz)] 61 kg (134 lb 7.7 oz) (07/21 0448) Weight change:  Last BM Date: 08/29/13  Intake/Output from previous day: 07/20 0701 - 07/21 0700 In: 951.2 [I.V.:951.2] Out: -  Intake/Output this shift: Total I/O In: 264.7 [I.V.:264.7] Out: -   General appearance: alert and cooperative Resp: clear to auscultation bilaterally Cardio: regular rate and rhythm, S1, S2 normal, no murmur, click, rub or gallop GI: soft, non-tender; bowel sounds normal; no masses,  no organomegaly  Lab Results:  Recent Labs  09/01/13 0346 09/02/13 0331  WBC 11.1* 15.6*  HGB 9.8* 10.2*  HCT 31.3* 33.1*  PLT 392 399   BMET  Recent Labs  08/31/13 0250 09/01/13 0346  NA 145 142  K 4.1 4.0  CL 107 105  CO2 24 24  GLUCOSE 110* 96  BUN 14 13  CREATININE 0.38* 0.38*  CALCIUM 9.5 9.6    Studies/Results: Ct Biopsy  09/01/2013   CLINICAL DATA:  Extensive osseous metastatic disease, large right iliac/ acetabular destructive bone mass  EXAM: CT GUIDED CORE BIOPSY OF RIGHT ILIAC DESTRUCTIVE BONE MASS  ANESTHESIA/SEDATION: Two  Mg IV Versed; 100 mcg IV Fentanyl  Total Moderate Sedation Time: 15 minutes.  PROCEDURE: The procedure risks, benefits, and alternatives were explained to the patient. Questions regarding the procedure were encouraged and answered. The patient understands and consents to the procedure.  The right hip region was prepped with Betadinein a sterile fashion, and a sterile drape was applied covering the operative field. A sterile gown and sterile gloves were used for the procedure. Local anesthesia was provided with 1% Lidocaine.  Previous imaging reviewed.  Patient positioned supine. Noncontrast localization CT performed. The large right iliac destructive bone mass was localized. Under sterile conditions and local anesthesia, a 17 gauge 6.8 cm access needle was advanced percutaneously from a right anterior oblique approach. Needle position confirmed within the bone mass. 18 gauge core biopsies obtained. Samples placed in formalin. No immediate complication. Patient tolerated the biopsy well.  Complications: No immediate  FINDINGS: Imaging confirms needle placed in in the right iliac destructive bone mass.  IMPRESSION: Successful right iliac destructive bone mass 18 gauge core biopsy.   Electronically Signed   By: Daryll Brod M.D.   On: 09/01/2013 15:32    Medications: I have reviewed the patient's current medications.  Assessment/Plan: Principal Problem:  Breast cancer metastasized to multiple sites, calvarium, cervical thoracic and lumbar spine with destructive lesion of L3. Per neurosurgery no plans for surgical intervention. . Biopsy of pelvic lesion done.  To begin XRT.  Still on steroids, will defer to radiation oncology on timing of discontinuation Active Problems:  Pain control, doing well with prn oxycodone/tylenol. Added senekot S  Leg weakness, out of proportion to spinal disease, she had old spinal injury but definate increase in leg weakness over last months, ?paraneoplastic syndrome  Protein-calorie malnutrition, severe  Hypercalcemia, improved. Continue normal saline  Pulmonary embolus, Change to lovenox, ?LMWH vs. Warfarin for long term anticoagulation, appreciate oncology input Disposition PT and OT Today, suspect she will need ST-NHP during treatment, SW consult    LOS: 6 days   Kristie Jackson  JOSEPH 09/02/2013, 5:52 AM

## 2013-09-02 NOTE — Progress Notes (Signed)
Kristie Jackson   DOB:1956/01/02   TD#:[id number]   TGG#:[id number]  Subjective: Patient receiving dilaudid prior to transfer by [facility].  Family at bedside.   Objective:  Filed Vitals:   09/02/13 1616  BP: 136/83  Pulse: 60  Temp: 99.5 F (37.5 C)  Resp: 16    Body mass index is 20.45 kg/(m^2).  Intake/Output Summary (Last 24 hours) at 09/02/13 1827 Last data filed at 09/02/13 1000  Gross per 24 hour  Intake 1343.18 ml  Output      0 ml  Net 1343.18 ml    Laying in bed  Sclerae unicteric  Oropharynx clear    CBG (last 3)  No results found for this basename: GLUCAP,  in the last 72 hours   Labs:  Lab Results  Component Value Date   WBC 15.6* 09/02/2013   HGB 10.2* 09/02/2013   HCT 33.1* 09/02/2013   MCV 94.0 09/02/2013   PLT 399 09/02/2013   NEUTROABS 11.1* 08/16/5007   Basic Metabolic Panel:  Recent Labs Lab 08/28/13 0039 08/28/13 1000 08/29/13 0350 08/30/13 0250 08/31/13 0250 09/01/13 0346  NA 142  --  139 143 145 142  K 3.8  --  4.0 3.7 4.1 4.0  CL 104  --  100 103 107 105  CO2 25  --  22 26 24 24   GLUCOSE 108*  --  110* 105* 110* 96  BUN 15  --  12 14 14 13   CREATININE 0.62  --  0.40* 0.38* 0.38* 0.38*  CALCIUM 10.6* 10.0 10.6* 10.1 9.5 9.6   GFR Estimated Creatinine Clearance: 74.7 ml/min (by C-G formula based on Cr of 0.38). Liver Function Tests:  Recent Labs Lab 08/28/13 0039 08/29/13 0350 08/30/13 0250 08/31/13 0250 09/01/13 0346  AST 19 17 15 19  39*  ALT 17 15 14 20  50*  ALKPHOS 122* 120* 109 102 103  BILITOT 0.2* <0.2* <0.2* <0.2* <0.2*  PROT 6.4 6.7 6.5 6.1 6.0  ALBUMIN 2.4* 2.6* 2.5* 2.4* 2.4*    Recent Labs Lab 08/27/13 2035  LIPASE 10*    Recent Labs Lab 08/28/13 0039  AMMONIA 22   Coagulation profile  Recent Labs Lab 09/01/13 0346  INR 0.99    CBC:  Recent Labs Lab 08/29/13 0350 08/30/13 0250 08/31/13 0250 09/01/13 0346 09/02/13 0331  WBC 8.6 7.8 8.5 11.1* 15.6*  NEUTROABS 6.7 5.2 5.6 6.9 11.1*  HGB 10.4*  10.1* 9.6* 9.8* 10.2*  HCT 33.3* 31.8* 30.6* 31.3* 33.1*  MCV 92.8 91.1 92.4 92.9 94.0  PLT 378 374 382 392 399   Cardiac Enzymes: No results found for this basename: CKTOTAL, CKMB, CKMBINDEX, TROPONINI,  in the last 168 hours BNP: No components found with this basename: POCBNP,  CBG: No results found for this basename: GLUCAP,  in the last 168 hours D-Dimer No results found for this basename: DDIMER,  in the last 72 hours Hgb A1c No results found for this basename: HGBA1C,  in the last 72 hours Lipid Profile No results found for this basename: CHOL, HDL, LDLCALC, TRIG, CHOLHDL, LDLDIRECT,  in the last 72 hours Thyroid function studies No results found for this basename: TSH, T4TOTAL, FREET3, T3FREE, THYROIDAB,  in the last 72 hours Anemia work up No results found for this basename: VITAMINB12, FOLATE, FERRITIN, TIBC, IRON, RETICCTPCT,  in the last 72 hours Microbiology Recent Results (from the past 240 hour(s))  CULTURE, BLOOD (ROUTINE X 2)     Status: None   Collection Time    08/27/13  9:30 PM      Result Value Ref Range Status   Specimen Description BLOOD ARM RIGHT   Final   Special Requests BOTTLES DRAWN AEROBIC AND ANAEROBIC 10CC   Final   Culture  Setup Time     Final   Value: 08/28/2013 04:08     Performed at [facility]   Culture     Final   Value:        BLOOD CULTURE RECEIVED NO GROWTH TO DATE CULTURE WILL BE HELD FOR 5 DAYS BEFORE ISSUING A FINAL NEGATIVE REPORT     Performed at [facility]   Report Status PENDING   Incomplete  CULTURE, BLOOD (ROUTINE X 2)     Status: None   Collection Time    08/27/13  9:35 PM      Result Value Ref Range Status   Specimen Description BLOOD HAND RIGHT   Final   Special Requests BOTTLES DRAWN AEROBIC ONLY 10CC   Final   Culture  Setup Time     Final   Value: 08/28/2013 04:09     Performed at [facility]   Culture     Final   Value:        BLOOD CULTURE RECEIVED NO GROWTH TO DATE CULTURE WILL BE HELD  FOR 5 DAYS BEFORE ISSUING A FINAL NEGATIVE REPORT     Performed at [facility]   Report Status PENDING   Incomplete  URINE CULTURE     Status: None   Collection Time    08/27/13 10:06 PM      Result Value Ref Range Status   Specimen Description URINE, CATHETERIZED   Final   Special Requests ADDED [id number]   Final   Culture  Setup Time     Final   Value: 08/28/2013 04:08     Performed at [facility]     Final   Value: NO GROWTH     Performed at [facility]   Culture     Final   Value: NO GROWTH     Performed at [facility]   Report Status 08/29/2013 FINAL   Final  MRSA PCR SCREENING     Status: None   Collection Time    08/28/13  1:55 AM      Result Value Ref Range Status   MRSA by PCR NEGATIVE  NEGATIVE Final   Comment:            The GeneXpert MRSA Assay (FDA     approved for NASAL specimens     only), is one component of a     comprehensive MRSA colonization     surveillance program. It is not     intended to diagnose MRSA     infection nor to guide or     monitor treatment for     MRSA infections.      Studies:  Ct Biopsy  09/01/2013   CLINICAL DATA:  Extensive osseous metastatic disease, large right iliac/ acetabular destructive bone mass  EXAM: CT GUIDED CORE BIOPSY OF RIGHT ILIAC DESTRUCTIVE BONE MASS  ANESTHESIA/SEDATION: Two  Mg IV Versed; 100 mcg IV Fentanyl  Total Moderate Sedation Time: 15 minutes.  PROCEDURE: The procedure risks, benefits, and alternatives were explained to the patient. Questions regarding the procedure were encouraged and answered. The patient understands and consents to the procedure.  The right hip region was prepped with Betadinein a sterile fashion, and  a sterile drape was applied covering the operative field. A sterile gown and sterile gloves were used for the procedure. Local anesthesia was provided with 1% Lidocaine.  Previous imaging reviewed. Patient positioned supine. Noncontrast  localization CT performed. The large right iliac destructive bone mass was localized. Under sterile conditions and local anesthesia, a 17 gauge 6.8 cm access needle was advanced percutaneously from a right anterior oblique approach. Needle position confirmed within the bone mass. 18 gauge core biopsies obtained. Samples placed in formalin. No immediate complication. Patient tolerated the biopsy well.  Complications: No immediate  FINDINGS: Imaging confirms needle placed in in the right iliac destructive bone mass.  IMPRESSION: Successful right iliac destructive bone mass 18 gauge core biopsy.   Electronically Signed   By: Daryll Brod M.D.   On: 09/01/2013 15:32   PATHOLOGY:  Bone, biopsy, Destructive rt iliac bone lesion - METASTATIC CARCINOMA. - SEE COMMENT. 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 A prognostic profile will be performed and the results reported separately. (JBK:gt, 09/02/13) Enid Cutter MD Pathologist, Electronic Signature (Case signed 09/02/2013) Specimen Gross and Clinical Information Specimen(s) Obtained: Bone, biopsy, Destructive rt iliac bone lesion Specimen Clinical Assessment: 58 y.o.  Results for JETTE, LEWAN (MRN [id number]) as of 09/02/2013 18:28  Ref. Range 08/29/2013 16:55  CA 27.29 Latest Range: 0-39 U/mL 20    Plan:   1. Metastatic Breast Cancer  -Mets to the spine, right acetabulum with cord compression, dura, axillary, supraclavicular LN, liver admitted with right leg pain and fever.  --Discussed pathology consistent with above.  Awaiting  ER/PR/Her tesing results. CA 27.29 20.   --Palliative XRT per Radiation oncology.  Transfer to [facility] today.   2. PE secondary to #1 and immobilization --Lovenox is favored in the setting of active malignancy.  If no evidence of active bleeding, I would transition her to lovenox.   3. ECOG 3.   4. Hypercalcemia, resolved.  --On  admission, her Ca levels were 11.5 in the setting of malignancy, now 9.6   5. Anemia of chronic disease. --Normocytic.  Patient asymptomatic presently.   6. Disposition. --Full code.   Lexus Barletta, MD 09/02/2013  6:27 PM

## 2013-09-02 NOTE — Progress Notes (Signed)
OT Cancellation Note  Patient Details Name: Kristie Jackson MRN: [id number] DOB: Apr 02, 1955   Cancelled Treatment:    Reason Eval/Treat Not Completed: Pain limiting ability to participate, and pt preparing to transfer [facility].  OT will reattempt tomorrow.   Darlina Rumpf [place], OTR/L [phone]  09/02/2013, 3:26 PM

## 2013-09-02 NOTE — Progress Notes (Signed)
Pt is transporting to [facility] long oncology [place] room [phone]. Pt is aware. Called and gave report to [place]. Called and spoke to [facility] ambulance and they will be transporting pt to [facility] but they stated they were very busy and it could be up to 2 hrs. Pt notified.

## 2013-09-02 NOTE — Progress Notes (Signed)
PT Cancellation Note  Patient Details Name: Kristie Jackson MRN: [id number] DOB: [birth year]   Cancelled Treatment:    Reason Eval/Treat Not Completed: Pain limiting ability to participate   [facility] 09/02/2013, 9:13 AM

## 2013-09-02 NOTE — Progress Notes (Signed)
[facility] is here to take pt to [facility] long. Pt has family at bedside. Pt in no distress at this time.

## 2013-09-02 NOTE — Progress Notes (Signed)
ANTICOAGULATION CONSULT NOTE - Initial Consult  Pharmacy Consult for Lovenox Indication: pulmonary embolus  Allergies  Allergen Reactions  . Morphine And Related Nausea And Vomiting    Patient Measurements: Height: 5\' 8"  (172.7 cm) Weight: 134 lb 7.7 oz (61 kg) IBW/kg (Calculated) : 63.9  Vital Signs: Temp: 98 F (36.7 C) (07/21 0448) Temp src: Oral (07/21 0448) BP: 133/78 mmHg (07/21 0448) Pulse Rate: 90 (07/20 1900)  Labs:  Recent Labs  08/31/13 0250 08/31/13 0950 09/01/13 0346 09/02/13 0331  HGB 9.6*  --  9.8* 10.2*  HCT 30.6*  --  31.3* 33.1*  PLT 382  --  392 399  LABPROT  --   --  13.1  --   INR  --   --  0.99  --   HEPARINUNFRC 0.29* 0.43 0.40 0.34  CREATININE 0.38*  --  0.38*  --     Estimated Creatinine Clearance: 74.7 ml/min (by C-G formula based on Cr of 0.38).   Medical History: Past Medical History  Diagnosis Date  . Central hypothyroidism   . Hyperlipidemia   . Spinal cord injury   . Depression   . Cancer     spinal metastasis    Medications:  Prescriptions prior to admission  Medication Sig Dispense Refill  . acetaminophen (TYLENOL) 500 MG tablet Take 1,000 mg by mouth every 6 (six) hours as needed for moderate pain.      . diazepam (VALIUM) 10 MG tablet Take 10 mg by mouth every 6 (six) hours as needed. For muscle spasms      . DULoxetine (CYMBALTA) 60 MG capsule Take 60 mg by mouth at bedtime.      . gabapentin (NEURONTIN) 600 MG tablet Take 1,200 mg by mouth 3 (three) times daily.      Marland Kitchen levothyroxine (SYNTHROID, LEVOTHROID) 75 MCG tablet Take 75 mcg by mouth See admin instructions. Takes daily except Monday and Thursday      . oxyCODONE-acetaminophen (PERCOCET/ROXICET) 5-325 MG per tablet Take 1-2 tablets by mouth every 6 (six) hours as needed for severe pain.  20 tablet  0  . rizatriptan (MAXALT) 10 MG tablet Take 10 mg by mouth as needed for migraine. May repeat in 2 hours if needed      . simvastatin (ZOCOR) 40 MG tablet Take 40 mg  by mouth daily.      Marland Kitchen tiZANidine (ZANAFLEX) 4 MG tablet Take 8 mg by mouth 3 (three) times daily.      Marland Kitchen topiramate (TOPAMAX) 100 MG tablet Take 100 mg by mouth at bedtime.       Scheduled:  . dexamethasone  4 mg Intravenous 4 times per day  . DULoxetine  60 mg Oral QHS  . enoxaparin (LOVENOX) injection  60 mg Subcutaneous Q12H  . feeding supplement (ENSURE COMPLETE)  237 mL Oral BID BM  . gabapentin  1,200 mg Oral TID  . levothyroxine  75 mcg Oral Once per day on Sun Tue Wed Fri Sat  . ondansetron [facility]) IV  4 mg Intravenous Once  . senna-docusate  2 tablet Oral QHS  . simvastatin  40 mg Oral Daily  . topiramate  100 mg Oral QHS   Infusions:  . sodium chloride 1,000 mL (09/02/13 0057)    Assessment: 58yo female now to transition to Lovenox from UFH for PE, team still determining long-term anticoag plan.  Goal of Therapy:  Anti-Xa level 0.6-1 units/ml 4hrs after LMWH dose given Monitor platelets by anticoagulation protocol: Yes   Plan:  Will begin Lovenox 60mg  SQ Q12H and monitor CBC.  Wynona Neat, PharmD, BCPS  09/02/2013,6:20 AM

## 2013-09-03 ENCOUNTER — Encounter: Payer: Self-pay | Admitting: Radiation Oncology

## 2013-09-03 ENCOUNTER — Ambulatory Visit: Payer: Medicare Other | Admitting: Radiation Oncology

## 2013-09-03 ENCOUNTER — Encounter (HOSPITAL_COMMUNITY): Payer: Self-pay | Admitting: *Deleted

## 2013-09-03 ENCOUNTER — Ambulatory Visit
Admit: 2013-09-03 | Discharge: 2013-09-03 | Disposition: A | Discharge status: Home / Self Care | Payer: Medicare Other | Attending: Radiation Oncology | Admitting: Radiation Oncology

## 2013-09-03 DIAGNOSIS — S32009A Unspecified fracture of unspecified lumbar vertebra, initial encounter for closed fracture: Secondary | ICD-10-CM | POA: Insufficient documentation

## 2013-09-03 DIAGNOSIS — F329 Major depressive disorder, single episode, unspecified: Secondary | ICD-10-CM | POA: Insufficient documentation

## 2013-09-03 DIAGNOSIS — Z7901 Long term (current) use of anticoagulants: Secondary | ICD-10-CM | POA: Insufficient documentation

## 2013-09-03 DIAGNOSIS — C7952 Secondary malignant neoplasm of bone marrow: Secondary | ICD-10-CM

## 2013-09-03 DIAGNOSIS — F3289 Other specified depressive episodes: Secondary | ICD-10-CM | POA: Insufficient documentation

## 2013-09-03 DIAGNOSIS — C7951 Secondary malignant neoplasm of bone: Secondary | ICD-10-CM | POA: Insufficient documentation

## 2013-09-03 DIAGNOSIS — C50919 Malignant neoplasm of unspecified site of unspecified female breast: Secondary | ICD-10-CM | POA: Insufficient documentation

## 2013-09-03 DIAGNOSIS — F17200 Nicotine dependence, unspecified, uncomplicated: Secondary | ICD-10-CM | POA: Insufficient documentation

## 2013-09-03 DIAGNOSIS — Z79899 Other long term (current) drug therapy: Secondary | ICD-10-CM | POA: Insufficient documentation

## 2013-09-03 DIAGNOSIS — E039 Hypothyroidism, unspecified: Secondary | ICD-10-CM | POA: Insufficient documentation

## 2013-09-03 DIAGNOSIS — Z510 Encounter for antineoplastic radiation therapy: Secondary | ICD-10-CM | POA: Insufficient documentation

## 2013-09-03 LAB — CULTURE, BLOOD (ROUTINE X 2)
Culture: NO GROWTH
Culture: NO GROWTH

## 2013-09-03 MED ORDER — POLYETHYLENE GLYCOL 3350 17 G PO PACK
17.0000 g | PACK | Freq: Two times a day (BID) | ORAL | Status: DC
  Administered 2013-09-03: 17 g via ORAL
  Filled 2013-09-03 (×4): qty 1

## 2013-09-03 MED ORDER — DEXAMETHASONE 4 MG PO TABS
4.0000 mg | ORAL_TABLET | Freq: Four times a day (QID) | ORAL | Status: DC
  Administered 2013-09-03 – 2013-09-05 (×10): 4 mg via ORAL
  Filled 2013-09-03 (×13): qty 1

## 2013-09-03 NOTE — Evaluation (Signed)
Occupational Therapy Evaluation Patient Details Name: Kristie Jackson MRN: [id number] DOB: [birth year] Today's Date: 09/03/2013    History of Present Illness 58 yo female admitted with sepsis, encephalopathy, PE, bil LE paralysis. 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 Obstructive lesion to L3. ? paraneoplastic syndrome per chart. Hx of spinal cord injury. Non ambulatory for ~ last 3 months. from home with caregiver.   Clinical Impression   This 58 year old female was admitted for the above. Pt is very motivated but limited by pain.  OT will focus on mobility related to ADLs to decrease burden of care and increase pt's participation    Follow Up Recommendations  SNF    Equipment Recommendations  None recommended by OT    Recommendations for Other Services       Precautions / Restrictions Precautions Precautions: Fall Precaution Comments: bil LE paralysis Restrictions Weight Bearing Restrictions: No      Mobility Bed Mobility Overal bed mobility: Needs Assistance Bed Mobility: Rolling Rolling: Total assist;+2 for physical assistance         General bed mobility comments: Increased time. Pt able to provide ~10% assist with UEs for task. Utilized bedpad. Significant pain with mobility.   Transfers                      Balance                                            ADL Overall ADL's : Needs assistance/impaired Eating/Feeding: Set up;Bed level   Grooming: Set up;Bed level   Upper Body Bathing: Set up;Bed level       Upper Body Dressing : Minimal assistance;Bed level                     General ADL Comments: pt has needed assistance with LB adls for approximately 3 months when she lost her ability to walk.  Pt needs total A x 2 to roll for LB adls/toileting.  She is wearing a depends type pad     Vision                     Perception     Praxis      Pertinent Vitals/Pain Pain is high with mobility:  repositioned      Hand Dominance     Extremity/Trunk Assessment Upper Extremity Assessment Upper Extremity Assessment: Generalized weakness (grossly 4- to 4/5)   Lower Extremity Assessment Lower Extremity Assessment: RLE deficits/detail;LLE deficits/detail RLE Deficits / Details: 0/5. Pain with any/all movement during repositioning.  LLE Deficits / Details: 0/5       Communication Communication Communication: No difficulties   Cognition Arousal/Alertness: Awake/alert Behavior During Therapy: WFL for tasks assessed/performed Overall Cognitive Status: Within Functional Limits for tasks assessed                     General Comments       Exercises       Shoulder Instructions      Home Living Family/patient expects to be discharged to:: Unsure Living Arrangements: Alone Available Help at Discharge: Personal care attendant                                    Prior Functioning/Environment Level of  Independence: Needs assistance  Gait / Transfers Assistance Needed: Non ambulatory. Has not ambulated/transferred within last 3 months.  ADL's / Homemaking Assistance Needed: pt can perform UB adls; total care for LB   Comments: was working with HHPT/OT; 2 people lifted her to chair when she got OOB.  Pt states she has 2 caregivers    OT Diagnosis: Acute pain;Generalized weakness;Paresis   OT Problem List: Decreased strength;Decreased activity tolerance;Pain (balance NT, expect problems)   OT Treatment/Interventions: Self-care/ADL training;Therapeutic exercise;Therapeutic activities;Balance training;Patient/family education    OT Goals(Current goals can be found in the care plan section) Acute Rehab OT Goals Patient Stated Goal: less pain and hopefully transfers OT Goal Formulation: With patient Time For Goal Achievement: 09/17/13 Potential to Achieve Goals: Good ADL Goals Additional ADL Goal #1: pt will sit unsupported at eob with min A for 3 minutes in  preparation for seated adls Additional ADL Goal #2: pt will roll to each side with A x 2, pt 25% for adls.  OT Frequency: Min 2X/week   Barriers to D/C:            Co-evaluation PT/OT/SLP Co-Evaluation/Treatment: Yes Reason for Co-Treatment: Complexity of the patient's impairments (multi-system involvement)   OT goals addressed during session: ADL's and self-care      End of Session    Activity Tolerance: Patient limited by pain Patient left: in bed;with call bell/phone within reach;with family/visitor present   Time: 5638-7564 OT Time Calculation (min): 24 min Charges:  OT General Charges $OT Visit: 1 Procedure OT Evaluation $Initial OT Evaluation Tier I: 1 Procedure OT Treatments $Self Care/Home Management : 8-22 mins G-Codes:    Leona Pressly 09/20/2013, 4:49 PM Lesle Chris, OTR/L [phone] 2013-09-20

## 2013-09-03 NOTE — Progress Notes (Signed)
Radiation Oncology         [phone] ________________________________  Initial inpatient Consultation  Name: Laiba Fuerte MRN: [id number]  Date: 09/03/2013  DOB: 30-Jul-1955  EP:PIRJJOA,CZYSA Juanda Crumble, MD  Horton Finer,*   REFERRING PHYSICIAN: Horton Finer,*  DIAGNOSIS: The encounter diagnosis was Breast cancer metastasized to multiple sites, unspecified laterality.  HISTORY OF PRESENT ILLNESS::Kristie Jackson is a 58 y.o. female with a 4 month history of right leg pain and weakness, associated with bladder incontinence,urgency and nocturia and bowel dysfunction. She had noted initially a change in her ambulation pattern around March of 2015 when she woke up to what she thought it was a ligament sprain on her right ankle, progressing to the right leg, causing falls, eventually requiring a walker and eventually bounding her to a wheelchair. She also began to experience weakness on the left leg as well.  She also described a "large lump" in her right breast for about 2 months or longer, for which she did not seek medical attention.   She presented on 7/16 to [facility] with sepsis and encephalopathy, and fevers up to 102, requiring IV antibiotics.  CT imaging shows widespread osseous metastatic disease with complete destruction of the right acetabulum, a S2 sacral metastasis, L3 compression fracture, L1 metastasis and other widespread metastatic disease.   MRIs confirmed spine metastases, and brain imaging shows no brain mets.  Biopsy of the right acetabular lesion shows carcinoma with stains pending.Marland Kitchen  PREVIOUS RADIATION THERAPY: No  PAST MEDICAL HISTORY:  has a past medical history of Central hypothyroidism; Hyperlipidemia; Spinal cord injury; Depression; and Cancer.    PAST SURGICAL HISTORY: Past Surgical History  Procedure Laterality Date  . Laminectomy    . Abdominal hysterectomy      FAMILY HISTORY: family history is not on file.  SOCIAL HISTORY:  reports  that she has been smoking Cigarettes.  She has been smoking about 1.00 pack per day. She does not have any smokeless tobacco history on file. She reports that she drinks alcohol. She reports that she does not use illicit drugs.  ALLERGIES: Morphine and related  MEDICATIONS:  No current facility-administered medications for this encounter.   No current outpatient prescriptions on file.   Facility-Administered Medications Ordered in Other Encounters  Medication Dose Route Frequency Provider Last Rate Last Dose  . acetaminophen (TYLENOL) tablet 650 mg  650 mg Oral Q6H PRN Marissa Sciacca, PA-C   650 mg at 08/27/13 2042  . dexamethasone (DECADRON) tablet 4 mg  4 mg Oral 4 times per day [name], MD   4 mg at 09/03/13 1232  . diazepam (VALIUM) tablet 10 mg  10 mg Oral Q8H PRN Allie Bossier, MD   10 mg at 09/03/13 0232  . DULoxetine (CYMBALTA) DR capsule 60 mg  60 mg Oral QHS Shanda Howells, MD   60 mg at 09/02/13 2222  . enoxaparin (LOVENOX) injection 60 mg  60 mg Subcutaneous Q12H Rogue Bussing, RPH   60 mg at 09/03/13 6301  . feeding supplement (ENSURE COMPLETE) (ENSURE COMPLETE) liquid 237 mL  237 mL Oral BID BM Rogue Bussing, RD   237 mL at 09/03/13 1400  . gabapentin (NEURONTIN) capsule 1,200 mg  1,200 mg Oral TID Horton Finer, MD   1,200 mg at 09/03/13 1626  . HYDROmorphone (DILAUDID) injection 0.5 mg  0.5 mg Intravenous Q4H PRN Rhetta Mura Schorr, NP   0.5 mg at 09/01/13 1555  . HYDROmorphone (DILAUDID) injection 1 mg  1 mg Intravenous Q4H PRN [name], MD   1 mg at 09/03/13 1038  . levothyroxine (SYNTHROID, LEVOTHROID) tablet 75 mcg  75 mcg Oral Once per day on Sun Tue Wed Fri Sat Shanda Howells, MD   75 mcg at 09/03/13 [phone]  . ondansetron (ZOFRAN) injection 4 mg  4 mg Intravenous Q6H PRN Allie Bossier, MD      . ondansetron [facility]) injection 4 mg  4 mg Intravenous Once Greggory Keen, MD      . oxyCODONE-acetaminophen (PERCOCET/ROXICET) 5-325  MG per tablet 1-2 tablet  1-2 tablet Oral Q6H PRN Allie Bossier, MD   2 tablet at 09/03/13 [phone]  . polyethylene glycol (MIRALAX / GLYCOLAX) packet 17 g  17 g Oral Daily PRN [name], MD      . polyethylene glycol [facility] Floria Raveling) packet 17 g  17 g Oral BID [name], MD   17 g at 09/03/13 [phone]  . senna-docusate (Senokot-S) tablet 2 tablet  2 tablet Oral QHS [name], MD   2 tablet at 09/01/13 2223  . simvastatin (ZOCOR) tablet 40 mg  40 mg Oral Daily Shanda Howells, MD   40 mg at 09/03/13 7353  . topiramate (TOPAMAX) tablet 100 mg  100 mg Oral QHS Shanda Howells, MD   100 mg at 09/02/13 2222    REVIEW OF SYSTEMS:  A 15 point review of systems is documented in the electronic medical record. This was obtained by the nursing staff. However, I reviewed this with the patient to discuss relevant findings and make appropriate changes.  Pertinent items are noted in HPI.   PHYSICAL EXAM:  vitals were not taken for this visit.  per med onc 7/17 GENERAL:alert,uncomfortable due to pain, tearful  SKIN: skin color, texture, turgor are normal, no rashes or significant lesions  EYES: normal, conjunctiva are pink and non-injected, sclera clear  OROPHARYNX: no exudate, no erythema and lips, buccal mucosa, and tongue normal  NECK: supple, thyroid normal size, non-tender, cannot appreciate nodularity  LYMPH: no palpable lymphadenopathy in the cervical, or inguinal. Cannot appreciate the supraclavicular adenopathy mentioned on imaging studies. Right axillary/chest wall small adenopathy palpable, shotty.  Breasts: large 7 x 7 x 3-4 cm mass at the right outer portion of the periareolar area, with nipple inversion and p'eau d'orange, without appreciable discharge. Non tender, firm. No there masses noted in either breast.  LUNGS: clear to auscultation and percussion with normal breathing effort  HEART: regular rate & rhythm and no murmurs and no lower extremity edema  ABDOMEN:abdomen  soft, non-tender and normal bowel sounds  Musculoskeletal:no cyanosis of digits and no clubbing  PSYCH: alert & oriented x 3 with fluent speech  NEURO: both lower extremities with decreased strength, 4/5 in left,  Further neuro exam by neurosurgery on 7/17 revealed: Neurological:  Trace movements of lower extremities with slight toe wiggle on the left. Proximally patient does not move iliopsoas or quads in either lower extremity sensation appears intact but diminished on the left and diminished on the right also distally upper extremity strength appears intact cranial nerve examination appears normal    KPS = 30  100 - Normal; no complaints; no evidence of disease. 90   - Able to carry on normal activity; minor signs or symptoms of disease. 80   - Normal activity with effort; some signs or symptoms of disease. 60   - Cares for self; unable to carry on normal activity or to do active work.  60   - Requires occasional assistance, but is able to care for most of his personal needs. 50   - Requires considerable assistance and frequent medical care. 39   - Disabled; requires special care and assistance. 59   - Severely disabled; hospital admission is indicated although death not imminent. 86   - Very sick; hospital admission necessary; active supportive treatment necessary. 10   - Moribund; fatal processes progressing rapidly. 0     - Dead  Karnofsky DA, Abelmann Greenleaf, Craver LS and Burchenal JH [phone]) The use of the nitrogen mustards in the palliative treatment of carcinoma: with particular reference to bronchogenic carcinoma Cancer 1 [phone]  LABORATORY DATA:  Lab Results  Component Value Date   WBC 15.6* 09/02/2013   HGB 10.2* 09/02/2013   HCT 33.1* 09/02/2013   MCV 94.0 09/02/2013   PLT 399 09/02/2013   Lab Results  Component Value Date   NA 142 09/01/2013   K 4.0 09/01/2013   CL 105 09/01/2013   CO2 24 09/01/2013   Lab Results  Component Value Date   ALT 50* 09/01/2013   AST 39*  09/01/2013   ALKPHOS 103 09/01/2013   BILITOT <0.2* 09/01/2013     RADIOGRAPHY: Ct Head Wo Contrast  08/28/2013   CLINICAL DATA:  The encephalopathy.  EXAM: CT HEAD WITHOUT CONTRAST  TECHNIQUE: Contiguous axial images were obtained from the base of the skull through the vertex without intravenous contrast.  COMPARISON:  None.  FINDINGS: Skull and Sinuses:There are ill-defined lytic lesions throughout the calvarium consistent with metastatic disease. The most notable lesions are in the right parietal and central occipital-parietal region measuring up to 3.5 cm in diameter. These lesions errode through the inner table, including at the level of the superior sagittal sinus.  Orbits: No acute abnormality.  Brain: No evidence of acute abnormality, such as acute infarction, hemorrhage, hydrocephalus, or mass lesion/mass effect. Apparent low-density in the medial right temporal lobe was evaluated on coronal reformats. This is in a region of streak artifact, the most likely cause. There is no mass effect in this region. No evidence of low-density along the cingulate gyrus or insula.  IMPRESSION: 1. No acute intracranial findings. 2. Multi focal calvarial metastatic disease. There is multi focal erosion through the inner table, including over the superior sagittal sinus. Suggest enhanced MRI followup.   Electronically Signed   By: Jorje Guild M.D.   On: 08/28/2013 02:21   Ct Chest W Contrast  08/29/2013   CLINICAL DATA:  Evaluate for source of malignancy.  EXAM: CT CHEST, ABDOMEN, AND PELVIS WITH CONTRAST  TECHNIQUE: Multidetector CT imaging of the chest, abdomen and pelvis was performed following the standard protocol during bolus administration of intravenous contrast.  CONTRAST:  19mL OMNIPAQUE IOHEXOL 300 MG/ML  SOLN  COMPARISON:  None.  FINDINGS: CT CHEST FINDINGS  THORACIC INLET/BODY WALL:  There is asymmetric, spiculated sub areolar tissue on the right. There is right axillary and deep pectoral  lymphadenopathy with cortical thickening up to 1 cm. There is a 7 mm right supraclavicular lymph node which is moderately suspicious.  Bilateral thyroid nodules with coarse calcification, measuring up to 2 cm on the right. This is likely incidental given the patient's overall clinical status.  MEDIASTINUM:  Normal heart size. No pericardial effusion. There is residual thymus noted. Filling present within segmental arteries of the right lower lobe. No lymphadenopathy.  LUNG WINDOWS:  No definitive metastasis. Small bilateral pleural effusions with dependent atelectasis.  OSSEOUS:  See  below  CT ABDOMEN AND PELVIS FINDINGS  BODY WALL: Unremarkable.  Liver: There is a 5 cm mass in segment 6 which has peripheral nodular enhancement and centripetal fill-in. There is a 5 mm and 7 mm low-density nodules within the right hepatic lobe which may enhance on delayed imaging.  Biliary: Prominent intrahepatic biliary tree without visible cause.  Pancreas: Prominent diameter of the main pancreatic duct, measuring 4 mm. There is no visible mass to cause obstruction.  Spleen: Unremarkable.  Adrenals: Unremarkable.  Kidneys and ureters: 4 mm nonobstructive stone in the interpolar left kidney.  Bladder: Focal thickening and enhancement along the right bladder base, measuring up to 7 mm in thickness. There is no excretory contrast to suggest layering hyperdense urine.  Reproductive: Hysterectomy.  Probable salpingo oophorectomies.  Bowel: Possible constipation. No bowel obstruction. No evidence of bowel inflammation.  Retroperitoneum: No mass or adenopathy.  Peritoneum: No ascites or pneumoperitoneum.  Vascular: No acute abnormality.  OSSEOUS: There is diffuse osteolytic and sclerotic bone metastases.  Notable deposits:  1. Extensive demineralization of the right pelvis, centered on the acetabulum, with extra osseous growth. No evidence of acute fracture. 2. L3 compression fracture with greater than 50% height loss and bony  retropulsion causing moderate to advanced spinal canal stenosis. There is extra osseous tumor at the level of the left pedicle, which infiltrates the canal and left foramen. 3. L1 compression fracture with height loss approximately 25%. 4. Multiple spinal metastases likely have extra osseous tumor extension, including T5 with perispinal growth on the right. The T5 body is mildly compressed on the right, without discrete fracture line.  Critical Value/emergent results were called by telephone at the time of interpretation on 08/29/2013 at 1:24 am to [place], who verbally acknowledged these results.  IMPRESSION: 1. Segmental pulmonary embolism to the right lower lobe. 2. Findings consistent with right breast carcinoma with axillary/pectoral nodal spread and diffuse osseous metastatic disease. 3. L1 and L3 pathologic compression fractures. Retropulsion and extraosseous tumor at L3 causes advanced spinal canal and left foraminal stenosis. 4. Extensive demineralization of the right acetabulum, at significant risk for pathologic fracture. 5. 5 cm hemangioma in the right liver. Two tiny low densities in the liver which can be followed by imaging. 6. Focal urothelial thickening in the right bladder, a possible second malignancy. 7. Borderline enlarged right supraclavicular lymph node.   Electronically Signed   By: Jorje Guild M.D.   On: 08/29/2013 01:31   Mr Jeri Cos WG Contrast  08/29/2013   CLINICAL DATA:  Extensive metastatic disease.  EXAM: MRI HEAD WITHOUT AND WITH CONTRAST  TECHNIQUE: Multiplanar, multiecho pulse sequences of the brain and surrounding structures were obtained without and with intravenous contrast.  CONTRAST:  60mL MULTIHANCE GADOBENATE DIMEGLUMINE 529 MG/ML IV SOLN  COMPARISON:  CT head without contrast 08/28/2013  FINDINGS: Multiple skull metastases are again noted. The largest lesions are in the parietal bones and at the apex of the occipital bone just below the lambdoid suture. The lesion in  the right parietal bone extends to the dura with some dural thickening. There PE occipital lesion also extends to the dura adjacent to the superior sagittal sinus. Sinus is patent.  No parenchymal lesions are evident. No acute infarct, hemorrhage or parenchymal mass lesion is present. There is no significant white matter disease.  Flow is present in the major intracranial arteries. The globes and orbits are intact. The paranasal sinuses and mastoid air cells are clear.  IMPRESSION: 1. Extensive skull metastases. 2.  At least 2 of these lesions extend to the dura with focal dural enhancement suggesting dural metastases or reactive change associated with the adjacent skull metastasis. 3. No evidence for parenchymal metastases. 4. The MRI appearance of the brain is normal for age.   Electronically Signed   By: Lawrence Santiago M.D.   On: 08/29/2013 13:55   Mr Cervical Spine W Wo Contrast  08/29/2013   CLINICAL DATA:  Metastatic disease to the bones.  EXAM: MRI CERVICAL SPINE WITHOUT AND WITH CONTRAST  TECHNIQUE: Multiplanar and multiecho pulse sequences of the cervical spine, to include the craniocervical junction and cervicothoracic junction, were obtained according to standard protocol without and with intravenous contrast.  CONTRAST:  43mL MULTIHANCE GADOBENATE DIMEGLUMINE 529 MG/ML IV SOLN  COMPARISON:  None.  FINDINGS: There are metastatic lesions involving every cervical vertebra as well as the clivus and the visualized portions of the occipital and parietal bones. There is a pathologic compression fracture of the anterior aspect of T1. There is no tumor protrusion into the spinal canal or neural foramina.  There is a small central disc protrusion at C5-6 which indents the ventral aspect of the spinal cord without myelopathy. This protrusion extends into the right neural foramen and osteophytes extend into the left neural foramen is could affect either or both C6 nerves.  Tumor is most prominent in the right  lateral mass of C6 and in the left lateral mass of C7. There is edema in the posterior paraspinal musculature throughout the cervical spine.  The metastases enhance after contrast administration.  Lymphoid tissue is prominent at the base of the tongue.  There is a complex nodule in the right side of the thyroid gland.  The patient has had previous posterior decompression at C3-4.  There is a tiny focal area of abnormal signal in the left side of the spinal cord at that level which is most likely myelopathy secondary to the now relieved neural impingement.  IMPRESSION: 1. Osseous metastases in the visualized portions of the skull and at each level level of the cervical spine. No visible neural impingement by tumor at this time. 2. Pathologic fracture of the anterior aspect of T1. T1 is diffusely infiltrated by tumor. 3. No epidural tumor.   Electronically Signed   By: Rozetta Nunnery M.D.   On: 08/29/2013 13:33   Mr Thoracic Spine W Wo Contrast  08/29/2013   CLINICAL DATA:  Diffuse osseous metastases from unknown primary tumor.  EXAM: MRI THORACIC SPINE WITHOUT AND WITH CONTRAST  TECHNIQUE: Multiplanar and multiecho pulse sequences of the thoracic spine were obtained without and with intravenous contrast.  CONTRAST:  62mL MULTIHANCE GADOBENATE DIMEGLUMINE 529 MG/ML IV SOLN  COMPARISON:  CT scan dated 08/28/2013  FINDINGS: There are metastatic lesions involving every vertebra in the thoracic spine. There is a pathologic compression fracture of the anterior aspect of T1. There is extensive tumor infiltration and T5, T7, T8, T10, T12, and L1.  Tumor extends into the left pedicle at T11 and slightly narrows the left neural foramen and slightly compresses the left posterior lateral aspect of the thecal sac but does not compress the spinal cord. There is a subtle pathologic compression fracture of the right side of the superior endplate of T5. No protrusion of bone or tumor or disc material into the spinal canal. No other  pathologic fractures no visible epidural tumor.  IMPRESSION: 1. Extensive metastatic disease throughout the thoracic spine with a subtle pathologic fracture of the superior aspect of T5.  2. No epidural tumor or neural impingement.   Electronically Signed   By: Rozetta Nunnery M.D.   On: 08/29/2013 13:57   Mr Lumbar Spine W Wo Contrast  08/29/2013   CLINICAL DATA:  Widespread metastatic disease to the bones.  EXAM: MRI LUMBAR SPINE WITHOUT AND WITH CONTRAST  TECHNIQUE: Multiplanar and multiecho pulse sequences of the lumbar spine were obtained without and with intravenous contrast.  CONTRAST:  66mL MULTIHANCE GADOBENATE DIMEGLUMINE 529 MG/ML IV SOLN  COMPARISON:  CT scan dated 08/28/2013  FINDINGS: Conus tip is at the inferior aspect of L2.  Metastatic disease extensively involves the entire lumbar spine as well as the sacrum in the adjacent iliac bones. There is a pathologic compression fracture of the L3 vertebra with protrusion of the posterior aspect of the infiltrated vertebral body into the spinal canal compressing the left side of the thecal sac in the left lateral recess. The tumor extends into the left pedicle, lamina, and facets and also extends into the left neural foramen at L3-4.  There is a slight compression deformity of the central portion of the L1 vertebra but there is no protrusion of bone or tumor or disc into the spinal canal at that level.  There is degenerative disc disease at L5-S1 with disc space narrowing and a 4 mm retrolisthesis with a small broad-based disc bulge with accompanying osteophytes. This touches both S1 nerves but does not compress them. There is fairly severe left foraminal stenosis at L5-S1.  There is diffuse edema in the paraspinal musculature.  After contrast administration there numerous metastases enhance.  IMPRESSION: 1. Diffuse metastatic disease throughout the spine with a compression fracture of L3. Infiltrated bone protrudes into the spinal canal and compresses the  left lateral recess in the left neural foramen at L3-4. 2. Slight pathologic compression fracture of the central portion of L1 without neural impingement. 3. There is no definitive epidural tumor.   Electronically Signed   By: Rozetta Nunnery M.D.   On: 08/29/2013 13:40   Ct Abdomen Pelvis W Contrast  08/29/2013   CLINICAL DATA:  Evaluate for source of malignancy.  EXAM: CT CHEST, ABDOMEN, AND PELVIS WITH CONTRAST  TECHNIQUE: Multidetector CT imaging of the chest, abdomen and pelvis was performed following the standard protocol during bolus administration of intravenous contrast.  CONTRAST:  5mL OMNIPAQUE IOHEXOL 300 MG/ML  SOLN  COMPARISON:  None.  FINDINGS: CT CHEST FINDINGS  THORACIC INLET/BODY WALL:  There is asymmetric, spiculated sub areolar tissue on the right. There is right axillary and deep pectoral lymphadenopathy with cortical thickening up to 1 cm. There is a 7 mm right supraclavicular lymph node which is moderately suspicious.  Bilateral thyroid nodules with coarse calcification, measuring up to 2 cm on the right. This is likely incidental given the patient's overall clinical status.  MEDIASTINUM:  Normal heart size. No pericardial effusion. There is residual thymus noted. Filling present within segmental arteries of the right lower lobe. No lymphadenopathy.  LUNG WINDOWS:  No definitive metastasis. Small bilateral pleural effusions with dependent atelectasis.  OSSEOUS:  See below  CT ABDOMEN AND PELVIS FINDINGS  BODY WALL: Unremarkable.  Liver: There is a 5 cm mass in segment 6 which has peripheral nodular enhancement and centripetal fill-in. There is a 5 mm and 7 mm low-density nodules within the right hepatic lobe which may enhance on delayed imaging.  Biliary: Prominent intrahepatic biliary tree without visible cause.  Pancreas: Prominent diameter of the main pancreatic duct, measuring 4 mm. There  is no visible mass to cause obstruction.  Spleen: Unremarkable.  Adrenals: Unremarkable.  Kidneys and  ureters: 4 mm nonobstructive stone in the interpolar left kidney.  Bladder: Focal thickening and enhancement along the right bladder base, measuring up to 7 mm in thickness. There is no excretory contrast to suggest layering hyperdense urine.  Reproductive: Hysterectomy.  Probable salpingo oophorectomies.  Bowel: Possible constipation. No bowel obstruction. No evidence of bowel inflammation.  Retroperitoneum: No mass or adenopathy.  Peritoneum: No ascites or pneumoperitoneum.  Vascular: No acute abnormality.  OSSEOUS: There is diffuse osteolytic and sclerotic bone metastases.  Notable deposits:  1. Extensive demineralization of the right pelvis, centered on the acetabulum, with extra osseous growth. No evidence of acute fracture. 2. 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 There is extra osseous tumor at the level of the left pedicle, which infiltrates the canal and left foramen. 3. L1 compression fracture with height loss approximately 25%. 4. Multiple spinal metastases likely have extra osseous tumor extension, including T5 with perispinal growth on the right. The T5 body is mildly compressed on the right, without discrete fracture line.  Critical Value/emergent results were called by telephone at the time of interpretation on 08/29/2013 at 1:24 am to [place], who verbally acknowledged these results.  IMPRESSION: 1. Segmental pulmonary embolism to the right lower lobe. 2. Findings consistent with right breast carcinoma with axillary/pectoral nodal spread and diffuse osseous metastatic disease. 3. L1 and L3 pathologic compression fractures. Retropulsion and extraosseous tumor at L3 causes advanced spinal canal and left foraminal stenosis. 4. Extensive demineralization of the right acetabulum, at significant risk for pathologic fracture. 5. 5 cm hemangioma in the right liver. Two tiny low densities in the liver which can be  followed by imaging. 6. Focal urothelial thickening in the right bladder, a possible second malignancy. 7. Borderline enlarged right supraclavicular lymph node.   Electronically Signed   By: Jorje Guild M.D.   On: 08/29/2013 01:31   Ct Biopsy  09/01/2013   CLINICAL DATA:  Extensive osseous metastatic disease, large right iliac/ acetabular destructive bone mass  EXAM: CT GUIDED CORE BIOPSY OF RIGHT ILIAC DESTRUCTIVE BONE MASS  ANESTHESIA/SEDATION: Two  Mg IV Versed; 100 mcg IV Fentanyl  Total Moderate Sedation Time: 15 minutes.  PROCEDURE: The procedure risks, benefits, and alternatives were explained to the patient. Questions regarding the procedure were encouraged and answered. The patient understands and consents to the procedure.  The right hip region was prepped with Betadinein a sterile fashion, and a sterile drape was applied covering the operative field. A sterile gown and sterile gloves were used for the procedure. Local anesthesia was provided with 1% Lidocaine.  Previous imaging reviewed. Patient positioned supine. Noncontrast localization CT performed. The large right iliac destructive bone mass was localized. Under sterile conditions and local anesthesia, a 17 gauge 6.8 cm access needle was advanced percutaneously from a right anterior oblique approach. Needle position confirmed within the bone mass. 18 gauge core biopsies obtained. Samples placed in formalin. No immediate complication. Patient tolerated the biopsy well.  Complications: No immediate  FINDINGS: Imaging confirms needle placed in in the right iliac destructive bone mass.  IMPRESSION: Successful right iliac destructive bone mass 18 gauge core biopsy.   Electronically Signed   By: Daryll Brod M.D.   On: 09/01/2013 15:32   Dg Chest Port 1 View  08/27/2013   CLINICAL DATA:  Fever.  Recent diagnosis of  cancer  EXAM: PORTABLE CHEST - 1 VIEW  COMPARISON:  None.  FINDINGS: Small streaky opacities in the lower lungs. No definitive  pneumonia. No edema, effusion, or pneumothorax. There is pulmonary hyperinflation suggesting COPD. Normal heart size and upper mediastinal contours when accounting for distortion from rightward rotation.  IMPRESSION: COPD and mild basilar atelectasis. No definitive pneumonia to explain fever.   Electronically Signed   By: Jorje Guild M.D.   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  PLAN:Today, I talked to the patient about the findings and work-up thus far.  We discussed the natural history of stage IV cancer and general treatment, highlighting the role or radiotherapy in the management.  We discussed the available radiation techniques, and focused on the details of logistics and delivery.  We reviewed the anticipated acute and late sequelae associated with radiation in this setting.  The patient was encouraged to ask questions that I answered to the best of my ability.  I filled out a patient counseling form during our discussion including treatment diagrams.  We retained a copy for our records.  The patient would like to proceed with radiation and will be scheduled for CT simulation.  I spent 60 minutes minutes face to face with the patient and more than 50% of that time was spent in counseling and/or coordination of care.   Plan to initiate 10 treatments tomorrow to T5 area, L-spine through sacrum and right hip for palliation.  Will await immunohistochemical stains regarding tumor type and systemic treatment options, especially since some tumor types can carry a much better prognosis warranting aggressive stabilization and support to maximize function.   ------------------------------------------------  Sheral Apley. Tammi Klippel, M.D.

## 2013-09-03 NOTE — Progress Notes (Signed)
Clinical Social Work Department CLINICAL SOCIAL WORK PLACEMENT NOTE 09/03/2013  Patient:  [name], Kristie Jackson  Account Number:  [id number] [place] date:  08/27/2013  Clinical Social Worker:  Ulyess Blossom  Date/time:  09/03/2013 10:00 AM  Clinical Social Work is seeking post-discharge placement for this patient at the following level of care:   [place]   (*CSW will update this form in Epic as items are completed)   09/03/2013  Patient/family provided with [place] Department of Clinical Social Work's list of facilities offering this level of care within the geographic area requested by the patient (or if unable, by the patient's family).  09/03/2013  Patient/family informed of their freedom to choose among providers that offer the needed level of care, that participate in Medicare, Medicaid or managed care program needed by the patient, have an available bed and are willing to accept the patient.  09/03/2013  Patient/family informed of [facility]' ownership interest in [facility], as well as of the fact that they are under no obligation to receive care at this facility.  PASARR submitted to EDS on 09/03/2013 PASARR number received on 09/03/2013  FL2 transmitted to all facilities in geographic area requested by pt/family on  09/03/2013 FL2 transmitted to all facilities within larger geographic area on   Patient informed that his/her managed care company has contracts with or will negotiate with  certain facilities, including the following:     Patient/family informed of bed offers received:   Patient chooses bed at  Physician recommends and patient chooses bed at    Patient to be transferred to  on   Patient to be transferred to facility by  Patient and family notified of transfer on  Name of family member notified:    The following physician request were entered in Epic:   Additional Comments:   Alison Murray, MSW, [place] Work [phone]

## 2013-09-03 NOTE — Progress Notes (Deleted)
OT Cancellation Note  Patient Details Name: Kristie Jackson MRN: [id number] DOB: [birth year]   Cancelled Treatment:    Reason Eval/Treat Not Completed: Other (comment).  Pt fatiqued this pm.  Will check back tomorrow.    Demetri Kerman 09/03/2013, 4:30 PM Lesle Chris, OTR/L [phone] 09/03/2013

## 2013-09-03 NOTE — Evaluation (Signed)
Physical Therapy Evaluation Patient Details Name: Makeisha Jentsch MRN: [id number] DOB: [birth year] Today's Date: 09/03/2013   History of Present Illness  58 yo female admitted with sepsis, encephalopathy, PE, bil LE paralysis. 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 Obstructive lesion to L3. ? paraneoplastic syndrome per chart. Hx of spinal cord injury. Non ambulatory for ~ last 3 months. from home with caregiver.  Clinical Impression  Bed level eval only-rolling towards L side for positioning, hygiene/patient care. Significant pain in R hip/LE, trunk with mobility. Limited assist from pt due to UE, core weakness. Discussed goals of mobility (bed mobility, sitting balance, transfers)-pt highly motivated to progress as able. Will plan to mobilize as tolerated during stay. May benefit from [place] rehab at [facility] for continued rehab and work towards mobility goals.    Follow Up Recommendations SNF    Equipment Recommendations  Other (comment) (perhaps lift equipment)    Recommendations for Other Services OT consult     Precautions / Restrictions Precautions Precautions: Fall Precaution Comments: bil LE paralysis Restrictions Weight Bearing Restrictions: No      Mobility  Bed Mobility Overal bed mobility: Needs Assistance Bed Mobility: Rolling Rolling: Total assist;+2 for physical assistance         General bed mobility comments: Increased time. Pt able to provide ~10% assist with UEs for task. Utilized bedpad. Significant pain with mobility.   Transfers                    Ambulation/Gait                Stairs            Wheelchair Mobility    Modified Rankin (Stroke Patients Only)       Balance                                             Pertinent Vitals/Pain R LE, back, ribcage 7/10 with activity. Repositioned in bed.     Home Living Family/patient expects to be discharged to:: Unsure Living Arrangements: Alone Available Help at Discharge:  Personal care attendant                  Prior Function Level of Independence: Needs assistance   Gait / Transfers Assistance Needed: Non ambulatory. Has not ambulated/transferred within last 3 months.   ADL's / Homemaking Assistance Needed: total care        Hand Dominance        Extremity/Trunk Assessment   Upper Extremity Assessment: Defer to OT evaluation           Lower Extremity Assessment: RLE deficits/detail;LLE deficits/detail RLE Deficits / Details: 0/5. Pain with any/all movement during repositioning.  LLE Deficits / Details: 0/5     Communication   Communication: No difficulties  Cognition Arousal/Alertness: Awake/alert Behavior During Therapy: WFL for tasks assessed/performed Overall Cognitive Status: Within Functional Limits for tasks assessed                      General Comments      Exercises        Assessment/Plan    PT Assessment Patient needs continued PT services  PT Diagnosis Acute pain;Other (comment);Generalized weakness (paraplegia)   PT Problem List Decreased strength;Decreased activity tolerance;Decreased balance;Decreased mobility;Pain;Decreased knowledge of use of DME  PT Treatment Interventions Functional mobility training;Therapeutic activities;Therapeutic exercise;Patient/family education;Balance  training   PT Goals (Current goals can be found in the Care Plan section) Acute Rehab PT Goals Patient Stated Goal: less pain and hopefully transfers PT Goal Formulation: With patient/family Time For Goal Achievement: 09/17/13 Potential to Achieve Goals: Fair    Frequency Min 3X/week   Barriers to discharge        Co-evaluation               End of Session   Activity Tolerance: Patient limited by pain Patient left: in bed;with call bell/phone within reach;with family/visitor present           Time: 2244-9753 PT Time Calculation (min): 24 min   Charges:   PT Evaluation $Initial PT Evaluation  Tier I: 1 Procedure PT Treatments $Therapeutic Activity: 8-22 mins   PT G Codes:          Weston Anna, MPT Pager: [phone]

## 2013-09-03 NOTE — Progress Notes (Signed)
Visited with patient and family at [facility]. Patient appreciative for visit but asked for me to return at another time. She was expecting momentarily a notary to come to notarize her advanced directive paperwork and she had family coming. I will follow up tomorrow if patient is still at [facility] and if she is not, I will follow up with her after she leaves and returns to [facility] for treatment.  Epifania Gore, PhD, [place]

## 2013-09-03 NOTE — Progress Notes (Signed)
Subjective: No bowel movement.  Pain controlled  Objective: Vital signs in last 24 hours: Temp:  [97.6 F (36.4 C)-99.5 F (37.5 C)] 97.6 F (36.4 C) (07/21 1955) Pulse Rate:  [49-74] 56 (07/21 1955) Resp:  [12-20] 16 (07/21 1955) BP: (118-142)/(57-91) 118/57 mmHg (07/21 1955) SpO2:  [95 %-98 %] 96 % (07/21 1955) Weight change:  Last BM Date: 08/29/13  Intake/Output from previous day: 07/21 0701 - 07/22 0700 In: 1062 [P.O.:237; I.V.:825] Out: -  Intake/Output this shift:    General appearance: alert and cooperative  Lab Results:  Recent Labs  09/01/13 0346 09/02/13 0331  WBC 11.1* 15.6*  HGB 9.8* 10.2*  HCT 31.3* 33.1*  PLT 392 399   BMET  Recent Labs  09/01/13 0346  NA 142  K 4.0  CL 105  CO2 24  GLUCOSE 96  BUN 13  CREATININE 0.38*  CALCIUM 9.6    Studies/Results: Ct Biopsy  09/01/2013   CLINICAL DATA:  Extensive osseous metastatic disease, large right iliac/ acetabular destructive bone mass  EXAM: CT GUIDED CORE BIOPSY OF RIGHT ILIAC DESTRUCTIVE BONE MASS  ANESTHESIA/SEDATION: Two  Mg IV Versed; 100 mcg IV Fentanyl  Total Moderate Sedation Time: 15 minutes.  PROCEDURE: The procedure risks, benefits, and alternatives were explained to the patient. Questions regarding the procedure were encouraged and answered. The patient understands and consents to the procedure.  The right hip region was prepped with Betadinein a sterile fashion, and a sterile drape was applied covering the operative field. A sterile gown and sterile gloves were used for the procedure. Local anesthesia was provided with 1% Lidocaine.  Previous imaging reviewed. Patient positioned supine. Noncontrast localization CT performed. The large right iliac destructive bone mass was localized. Under sterile conditions and local anesthesia, a 17 gauge 6.8 cm access needle was advanced percutaneously from a right anterior oblique approach. Needle position confirmed within the bone mass. 18 gauge core  biopsies obtained. Samples placed in formalin. No immediate complication. Patient tolerated the biopsy well.  Complications: No immediate  FINDINGS: Imaging confirms needle placed in in the right iliac destructive bone mass.  IMPRESSION: Successful right iliac destructive bone mass 18 gauge core biopsy.   Electronically Signed   By: Daryll Brod M.D.   On: 09/01/2013 15:32    Medications: I have reviewed the patient's current medications.  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 Per neurosurgery no plans for surgical intervention. . Biopsy of pelvic lesion pending. To begin XRT. Still on steroids (change to po), will defer to radiation oncology on timing of discontinuation   Active Problems:  Pain control, doing well with prn oxycodone/tylenol. No bowel movement, add miralax to senekot S   Leg weakness, out of proportion to spinal disease, she had old spinal injury but definate increase in leg weakness over last months, ?paraneoplastic syndrome   Protein-calorie malnutrition, severe   Hypercalcemia, improved. Discontinue normal saline   Pulmonary embolus, Changed to lovenox, appreciate oncology input  Disposition PT and OT as tolerated, depending on length of radiation therapy treatment course, treat as inpatient vs D/C to nursing home with transportation to rad therapy    LOS: 7 days   Kristie Jackson JOSEPH 09/03/2013, 6:11 AM

## 2013-09-03 NOTE — Progress Notes (Signed)
CSW received referral for New SNF.  CSW received notification from [facility] that pt agreeable to SNF search as pt needs short term rehab and assistance with transportation to radiation. CSW initiated search.  CSW met with pt and pt family at bedside.  Pt stated that now was not a good time to discuss disposition as she had just received a laxative. CSW expressed understanding and asked if CSW could leave facility offers at bedside. Pt agreeable.  Pt agreeable to CSW returning later this afternoon to further discuss. CSW to complete full psychosocial assessment at that time.  Alison Murray, MSW, [place] Work [phone]

## 2013-09-03 NOTE — Progress Notes (Signed)
  Radiation Oncology         [phone]) [phone] ________________________________  Name: Gayla Benn MRN: [id number]  Date: 09/03/2013  DOB: 30-Nov-1955  INPATIENT  SIMULATION AND TREATMENT PLANNING NOTE  DIAGNOSIS:  58 yo woman with skeletal metastases from breast cancer  NARRATIVE:  The patient was brought to the [place].  Identity was confirmed.  All relevant records and images related to the planned course of therapy were reviewed.  The patient freely provided informed written consent to proceed with treatment after reviewing the details related to the planned course of therapy. The consent form was witnessed and verified by the simulation staff.  Then, the patient was set-up in a stable reproducible  supine position for radiation therapy.  CT images were obtained.  Surface markings were placed.  The CT images were loaded into the planning software.  Then the target and avoidance structures were contoured.  Treatment planning then occurred.  The radiation prescription was entered and confirmed.  Then, I designed and supervised the construction of a total of 7 medically necessary complex treatment devices including bodyFix leg holder and 6 MLC apertures.  I have requested : 3D Simulation  I have requested a DVH of the following structures: Lumbar targets and kidneys.  I have ordered:Nutrition Consult  PLAN:  The patient will receive 30 Gy in 10 fractions.  ________________________________  Sheral Apley Tammi Klippel, M.D.

## 2013-09-04 ENCOUNTER — Ambulatory Visit: Payer: Medicare Other | Admitting: Radiation Oncology

## 2013-09-04 ENCOUNTER — Ambulatory Visit
Admit: 2013-09-04 | Discharge: 2013-09-04 | Disposition: A | Discharge status: Home / Self Care | Payer: Medicare Other | Attending: Radiation Oncology | Admitting: Radiation Oncology

## 2013-09-04 DIAGNOSIS — C7951 Secondary malignant neoplasm of bone: Secondary | ICD-10-CM

## 2013-09-04 LAB — BASIC METABOLIC PANEL
Anion gap: 10 (ref 5–15)
BUN: 19 mg/dL (ref 6–23)
CALCIUM: 10 mg/dL (ref 8.4–10.5)
CHLORIDE: 103 meq/L (ref 96–112)
CO2: 28 meq/L (ref 19–32)
CREATININE: 0.47 mg/dL — AB (ref 0.50–1.10)
GFR calc non Af Amer: >90 mL/min (ref >90)
Glucose, Bld: 113 mg/dL — ABNORMAL HIGH (ref 70–99)
Potassium: 4.4 mEq/L (ref 3.7–5.3)
Sodium: 141 mEq/L (ref 137–147)

## 2013-09-04 MED ORDER — POLYETHYLENE GLYCOL 3350 17 G PO PACK
17.0000 g | PACK | Freq: Every day | ORAL | Status: DC | PRN
  Filled 2013-09-04: qty 1

## 2013-09-04 NOTE — Progress Notes (Signed)
Subjective: Had bowel movement  Objective: Vital signs in last 24 hours: Temp:  [98 F (36.7 C)-98.7 F (37.1 C)] 98.5 F (36.9 C) (07/23 0602) Pulse Rate:  [47-61] 53 (07/23 0602) Resp:  [16-18] 16 (07/23 0602) BP: (121-137)/(55-68) 137/63 mmHg (07/23 0602) SpO2:  [94 %-98 %] 98 % (07/23 0602) Weight:  [64.2 kg (141 lb 8.6 oz)] 64.2 kg (141 lb 8.6 oz) (07/23 0032) Weight change:  Last BM Date: 09/03/13  Intake/Output from previous day: 07/22 0701 - 07/23 0700 In: 480 [P.O.:480] Out: -  Intake/Output this shift: Total I/O In: 480 [P.O.:480] Out: -   General appearance: alert and cooperative  Lab Results:  Recent Labs  09/02/13 0331  WBC 15.6*  HGB 10.2*  HCT 33.1*  PLT 399   BMET  Recent Labs  09/04/13 0404  NA 141  K 4.4  CL 103  CO2 28  GLUCOSE 113*  BUN 19  CREATININE 0.47*  CALCIUM 10.0    Studies/Results: No results found.  Medications: I have reviewed the patient's current medications.  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 To begin XRT today. Still on steroids (change to po), will defer to radiation oncology on timing of discontinuation   Active Problems:  Pain control, doing well with prn oxycodone/tylenol.Had BM, change miralax to prn  Leg weakness, out of proportion to spinal disease, she had old spinal injury but definate increase in leg weakness over last months, ?paraneoplastic syndrome   Protein-calorie malnutrition, severe   Hypercalcemia, improved. Discontinue normal saline   Pulmonary embolus, Changed to lovenox, appreciate oncology input   Disposition;  She has decided against [facility] for rehab, wants to go home with home PT/OT, she has 2 caregivers 24 hours daily.  Will need a manual wheelchair and transportation to radiation oncology through 8/5.    LOS: 8 days    Kristie Jackson 09/04/2013, 6:13 AM

## 2013-09-04 NOTE — Progress Notes (Signed)
Clinical Social Work Department BRIEF PSYCHOSOCIAL ASSESSMENT Late Entry 09/04/2013  Patient:  Kristie Jackson, Kristie Jackson     Account Number:  [id number]     Admit date:  08/27/2013  Clinical Social Worker:  Ulyess Blossom  Date/Time:  09/03/2013 02:50 PM  Referred by:  Physician  Date Referred:  09/03/2013 Referred for  SNF Placement   Other Referral:   Interview type:  Patient Other interview type:   and patient family at bedside    PSYCHOSOCIAL DATA Living Status:  ALONE Admitted from facility:   Level of care:   Primary support name:  Kristie Jackson/daughter/[phone] Primary support relationship to patient:  CHILD, ADULT Degree of support available:   strong    CURRENT CONCERNS Current Concerns  Post-Acute Placement   Other Concerns:    SOCIAL WORK ASSESSMENT / PLAN CSW received referral for New SNF.    CSW met with pt, pt daughter and pt caregivers at bedside. CSW introduced self and explained role. Pt discussed that she lives at home and has two 24 hour caregivers. Pt stated that she is non-ambulatory and concern at this time is her ability to transport to and from radiation treatments. CSW discussed SNF bed options. Pt reviewed options and stated that she wishes to return home with her caregivers. RNCM was able to assist pt in obtaining a wheelchair and pt plans to use [facility] for transportation to radiation treatments.    CSW to continue to follow to assist as needed.   Assessment/plan status:  Psychosocial Support/Ongoing Assessment of Needs Other assessment/ plan:   Information/referral to community resources:   [facility] list    PATIENT'S/FAMILY'S RESPONSE TO PLAN OF CARE: Pt alert and oriented x 4. Pt very eager to remain at home as pt has 24 hour caregivers in her home and hopeful that the wheelchair transportation will be feasible to and from radiation treatments as long as she is able to tolerate sitting in the wheelchair for the transport.    Kristie Jackson, MSW, [place] Work [phone]

## 2013-09-04 NOTE — Progress Notes (Signed)
Medical Oncology Follow up: Patient reports pain well controlled.  I spoke with pathology (Dr. Lyndon Code) regarding pathology collected on 09/01/2013.  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  Her2 testing will be available on Tuesday 07/28.  If also Her 2 negative, I think combination therapy with palbociclib 125 mg daily (3 weeks on and 1 week off) plus femara 2.5 mg daily will be a reasonable option.  We will await HER2 testing results.

## 2013-09-04 NOTE — Progress Notes (Signed)
CSW continuing to follow.  CSW met with pt at bedside.  Pt discussed that she is eager to have wheelchair delivered this afternoon and CSW provided support as pt discussed how having a wheelchair will greatly assist her as she will be able to go outside and stay home during radiation and be able to transport via [facility] wheelchair transportation [place] to and from radiation treatments.   CSW discussed with pt about transportation home at discharge. Pt reports that per MD, plan is for pt to discharge tomorrow following radiation treatment. CSW discussed with pt that CSW can assist with arranging [facility] or arranged non-emergency transport home. Pt shared that she would prefer [facility] as she feels that the cost is less than the copayment that she would have to pay for insurance covering transport. Pt agreeable to CSW contacting [facility] to arrange transport for tomorrow.  CSW spoke with [place] and arranged pick up for 2:30 pm from hospital for transportation home.  CSW discussed with pt at bedside and pt expressed appreciation for assistance with arranging transport.  CSW to continue to follow to provide support and assist as needed.  Alison Murray, MSW, [place] Work [phone]

## 2013-09-04 NOTE — Progress Notes (Signed)
NUTRITION FOLLOW UP  Intervention:   -Continue with Ensure Complete BID -Encouraged PO intake of high kcal/protein foods -Consider diet liberalization as needed -Will continue to monitor  Nutrition Dx:   Inadequate oral intake related to poor appetite, pain as evidenced by patient report- improving   Goal: Pt to meet >/= 90% of their estimated nutrition needs - progressing   Monitor:   PO & supplemental intake, weight, labs, I/O's    Assessment:   7/16: Patient reports a decreased appetite; doesn't eat when she is in pain; does not consistently eat 3 meals per day; grazes throughout the day; does like Boost supplements; also endorses a 30 lb weight loss in the past 3-4 months (17%) -- severe for time frame; would benefit from addition of supplements during hospitalization -- RD to order.  Low braden score places patient at risk for skin breakdown.  Nutrition Focused Physical Exam:  Subcutaneous Fat:  Orbital Region: N/A  Upper Arm Region: moderate-severe depletion  Thoracic and Lumbar Region: N/A  Muscle:  Temple Region: moderate depletion  Clavicle Bone Region: moderate-severe depletion  Clavicle and Acromion Bone Region: moderate-severe depletion  Scapular Bone Region: N/A  Dorsal Hand: N/A  Patellar Region: N/A  Anterior Thigh Region: N/A  Posterior Calf Region: N/A  Edema: none  7/23: -Pt reported a significant improvement in PO intake, is able to consistently eat > 50% of meals. Tolerating variety of foods, denied nausea/vomiting post meals -Pt also consuming Ensure Complete BID, continues to enjoy taste and drink 100% of supplements -Denied additional snacks at this time with improved appetite -Encouraged intake of balanced meals and high protein foods, pt verbalized understanding -Currently on heart healthy diet; may benefit from diet liberalization if PO intake decreases -Was constipated previously during admit, however now receiving colace and having regular  BMs; which is likely contributing to improved appetite  Height: Ht Readings from Last 1 Encounters:  09/02/13 5\' 8"  (1.727 m)    Weight Status:   Wt Readings from Last 1 Encounters:  09/04/13 141 lb 8.6 oz (64.2 kg)    Re-estimated needs:  Kcal: 1800-2000  Protein: 90-100 gm  Fluid: 1.8-2.0 L   Skin: WDL  Diet Order: Cardiac   Intake/Output Summary (Last 24 hours) at 09/04/13 1133 Last data filed at 09/03/13 2000  Gross per 24 hour  Intake    480 ml  Output      0 ml  Net    480 ml    Last BM: 7/22   Labs:   Recent Labs Lab 08/31/13 0250 09/01/13 0346 09/04/13 0404  NA 145 142 141  K 4.1 4.0 4.4  CL 107 105 103  CO2 24 24 28   BUN 14 13 19   CREATININE 0.38* 0.38* 0.47*  CALCIUM 9.5 9.6 10.0  GLUCOSE 110* 96 113*    CBG (last 3)  No results found for this basename: GLUCAP,  in the last 72 hours  Scheduled Meds: . dexamethasone  4 mg Oral 4 times per day  . DULoxetine  60 mg Oral QHS  . enoxaparin (LOVENOX) injection  60 mg Subcutaneous Q12H  . feeding supplement (ENSURE COMPLETE)  237 mL Oral BID BM  . gabapentin  1,200 mg Oral TID  . levothyroxine  75 mcg Oral Once per day on Sun Tue Wed Fri Sat  . ondansetron [facility]) IV  4 mg Intravenous Once  . senna-docusate  2 tablet Oral QHS  . simvastatin  40 mg Oral Daily  . topiramate  100  mg Oral QHS    Continuous Infusions:   Atlee Abide MS RD LDN Clinical Dietitian [id number]

## 2013-09-05 ENCOUNTER — Ambulatory Visit
Admit: 2013-09-05 | Discharge: 2013-09-05 | Disposition: A | Discharge status: Home / Self Care | Payer: Medicare Other | Attending: Radiation Oncology | Admitting: Radiation Oncology

## 2013-09-05 ENCOUNTER — Ambulatory Visit: Payer: Medicare Other

## 2013-09-05 ENCOUNTER — Other Ambulatory Visit: Payer: Self-pay | Admitting: Internal Medicine

## 2013-09-05 DIAGNOSIS — C7951 Secondary malignant neoplasm of bone: Secondary | ICD-10-CM

## 2013-09-05 DIAGNOSIS — C787 Secondary malignant neoplasm of liver and intrahepatic bile duct: Secondary | ICD-10-CM

## 2013-09-05 DIAGNOSIS — C50919 Malignant neoplasm of unspecified site of unspecified female breast: Secondary | ICD-10-CM

## 2013-09-05 DIAGNOSIS — C778 Secondary and unspecified malignant neoplasm of lymph nodes of multiple regions: Secondary | ICD-10-CM

## 2013-09-05 DIAGNOSIS — Z170 Estrogen receptor positive status [ER+]: Secondary | ICD-10-CM

## 2013-09-05 LAB — CBC
HCT: 36.9 % (ref 36.0–46.0)
Hemoglobin: 11.7 g/dL — ABNORMAL LOW (ref 12.0–15.0)
MCH: 29.5 pg (ref 26.0–34.0)
MCHC: 31.7 g/dL (ref 30.0–36.0)
MCV: 92.9 fL (ref 78.0–100.0)
Platelets: 391 10*3/uL (ref 150–400)
RBC: 3.97 MIL/uL (ref 3.87–5.11)
RDW: 15.1 % (ref 11.5–15.5)
WBC: 12.8 10*3/uL — ABNORMAL HIGH (ref 4.0–10.5)

## 2013-09-05 MED ORDER — POLYETHYLENE GLYCOL 3350 17 G PO PACK: 17.0000 g | PACK | Freq: Every day | ORAL | Status: DC | PRN

## 2013-09-05 MED ORDER — LETROZOLE 2.5 MG PO TABS: 2.5000 mg | ORAL_TABLET | Freq: Every day | ORAL | Status: DC

## 2013-09-05 MED ORDER — ENOXAPARIN SODIUM 60 MG/0.6ML ~~LOC~~ SOLN: 60.0000 mg | Freq: Two times a day (BID) | SUBCUTANEOUS | Status: AC

## 2013-09-05 MED ORDER — LETROZOLE 2.5 MG PO TABS
2.5000 mg | ORAL_TABLET | Freq: Every day | ORAL | Status: DC
  Administered 2013-09-05: 2.5 mg via ORAL
  Filled 2013-09-05: qty 1

## 2013-09-05 MED ORDER — SENNOSIDES-DOCUSATE SODIUM 8.6-50 MG PO TABS: 2.0000 | ORAL_TABLET | Freq: Every day | ORAL | Status: DC

## 2013-09-05 MED ORDER — OXYCODONE-ACETAMINOPHEN 5-325 MG PO TABS: 1.0000 | ORAL_TABLET | Freq: Four times a day (QID) | ORAL | Status: DC | PRN

## 2013-09-05 MED ORDER — DEXAMETHASONE 4 MG PO TABS: 4.0000 mg | ORAL_TABLET | Freq: Four times a day (QID) | ORAL | Status: DC

## 2013-09-05 NOTE — Progress Notes (Signed)
[facility] is providing the following services: Wheelchair  If patient discharges after hours, please call [phone].   Linward Headland 09/05/2013, 10:21 AM

## 2013-09-05 NOTE — Progress Notes (Signed)
ANTICOAGULATION CONSULT NOTE - Follow up  Pharmacy Consult for Lovenox Indication: pulmonary embolus  Allergies  Allergen Reactions  . Morphine And Related Nausea And Vomiting    Patient Measurements: Height: 5\' 8"  (172.7 cm) Weight: 137 lb 2 oz (62.2 kg) IBW/kg (Calculated) : 63.9  Vital Signs: Temp: 97.7 F (36.5 C) (07/24 0623) Temp src: Oral (07/24 0623) BP: 133/67 mmHg (07/24 0623) Pulse Rate: 60 (07/24 0623)  Labs:  Recent Labs  09/04/13 0404 09/05/13 0431  HGB  --  11.7*  HCT  --  36.9  PLT  --  391  CREATININE 0.47*  --     Estimated Creatinine Clearance: 76.2 ml/min (by C-G formula based on Cr of 0.47).   Assessment: 58yo female transitioned to Lovenox from UFH for PE. CBC stable. SCr wnl. No bleeding reported/documented. Planning discharge today on Lovenox.  Goal of Therapy:  Anti-Xa level 0.6-1 units/ml 4hrs after LMWH dose given Monitor platelets by anticoagulation protocol: Yes   Plan:   Cont Lovenox 60mg  SQ q12h. Could switch to 90mg  SQ q24h if desired.   Check CBC q48h.  F/u daily.  Romeo Rabon, PharmD, pager [phone]. 09/05/2013,7:55 AM.

## 2013-09-05 NOTE — Progress Notes (Signed)
EMS arrived to transport pt home; small report given to driver; pt stable at time of discharge; all equipment and home health needs arranged and set up

## 2013-09-05 NOTE — Progress Notes (Signed)
Kristie Jackson   DOB:1955-08-01   VF#:[id number]   ACZ#:[id number]  Subjective: Patient is scheduled for discharge this am.  Family at bedside.   Objective:  Filed Vitals:   09/05/13 0623  BP: 133/67  Pulse: 60  Temp: 97.7 F (36.5 C)  Resp: 20    Body mass index is 20.85 kg/(m^2).  Intake/Output Summary (Last 24 hours) at 09/05/13 0929 Last data filed at 09/05/13 0600  Gross per 24 hour  Intake    480 ml  Output      0 ml  Net    480 ml    Laying in bed, NAD  Sclerae unicteric  Oropharynx clear  RR: S1S2 present  CTAB/L in anterior fields  Abdomen Soft, NT/ND +BS  No peripheral edema    CBG (last 3)  No results found for this basename: GLUCAP,  in the last 72 hours   Labs:  Lab Results  Component Value Date   WBC 12.8* 09/05/2013   HGB 11.7* 09/05/2013   HCT 36.9 09/05/2013   MCV 92.9 09/05/2013   PLT 391 09/05/2013   NEUTROABS 11.1* 02/21/3233   Basic Metabolic Panel:  Recent Labs Lab 08/30/13 0250 08/31/13 0250 09/01/13 0346 09/04/13 0404  NA 143 145 142 141  K 3.7 4.1 4.0 4.4  CL 103 107 105 103  CO2 26 24 24 28   GLUCOSE 105* 110* 96 113*  BUN 14 14 13 19   CREATININE 0.38* 0.38* 0.38* 0.47*  CALCIUM 10.1 9.5 9.6 10.0   GFR Estimated Creatinine Clearance: 76.2 ml/min (by C-G formula based on Cr of 0.47). Liver Function Tests:  Recent Labs Lab 08/30/13 0250 08/31/13 0250 09/01/13 0346  AST 15 19 39*  ALT 14 20 50*  ALKPHOS 109 102 103  BILITOT <0.2* <0.2* <0.2*  PROT 6.5 6.1 6.0  ALBUMIN 2.5* 2.4* 2.4*   No results found for this basename: LIPASE, AMYLASE,  in the last 168 hours No results found for this basename: AMMONIA,  in the last 168 hours Coagulation profile  Recent Labs Lab 09/01/13 0346  INR 0.99    CBC:  Recent Labs Lab 08/30/13 0250 08/31/13 0250 09/01/13 0346 09/02/13 0331 09/05/13 0431  WBC 7.8 8.5 11.1* 15.6* 12.8*  NEUTROABS 5.2 5.6 6.9 11.1*  --   HGB 10.1* 9.6* 9.8* 10.2* 11.7*  HCT 31.8* 30.6* 31.3* 33.1* 36.9   MCV 91.1 92.4 92.9 94.0 92.9  PLT 374 382 392 399 391   Cardiac Enzymes: No results found for this basename: CKTOTAL, CKMB, CKMBINDEX, TROPONINI,  in the last 168 hours BNP: No components found with this basename: POCBNP,  CBG: No results found for this basename: GLUCAP,  in the last 168 hours D-Dimer No results found for this basename: DDIMER,  in the last 72 hours Hgb A1c No results found for this basename: HGBA1C,  in the last 72 hours Lipid Profile No results found for this basename: CHOL, HDL, LDLCALC, TRIG, CHOLHDL, LDLDIRECT,  in the last 72 hours Thyroid function studies No results found for this basename: TSH, T4TOTAL, FREET3, T3FREE, THYROIDAB,  in the last 72 hours Anemia work up No results found for this basename: VITAMINB12, FOLATE, FERRITIN, TIBC, IRON, RETICCTPCT,  in the last 72 hours Microbiology Recent Results (from the past 240 hour(s))  CULTURE, BLOOD (ROUTINE X 2)     Status: None   Collection Time    08/27/13  9:30 PM      Result Value Ref Range Status   Specimen Description BLOOD ARM RIGHT  Final   Special Requests BOTTLES DRAWN AEROBIC AND ANAEROBIC 10CC   Final   Culture  Setup Time     Final   Value: 08/28/2013 04:08     Performed at [facility]   Culture     Final   Value: NO GROWTH 5 DAYS     Performed at [facility]   Report Status 09/03/2013 FINAL   Final  CULTURE, BLOOD (ROUTINE X 2)     Status: None   Collection Time    08/27/13  9:35 PM      Result Value Ref Range Status   Specimen Description BLOOD HAND RIGHT   Final   Special Requests BOTTLES DRAWN AEROBIC ONLY 10CC   Final   Culture  Setup Time     Final   Value: 08/28/2013 04:09     Performed at [facility]   Culture     Final   Value: NO GROWTH 5 DAYS     Performed at [facility]   Report Status 09/03/2013 FINAL   Final  URINE CULTURE     Status: None   Collection Time    08/27/13 10:06 PM      Result Value Ref Range Status   Specimen  Description URINE, CATHETERIZED   Final   Special Requests ADDED [id number]   Final   Culture  Setup Time     Final   Value: 08/28/2013 04:08     Performed at [facility]     Final   Value: NO GROWTH     Performed at [facility]   Culture     Final   Value: NO GROWTH     Performed at [facility]   Report Status 08/29/2013 FINAL   Final  MRSA PCR SCREENING     Status: None   Collection Time    08/28/13  1:55 AM      Result Value Ref Range Status   MRSA by PCR NEGATIVE  NEGATIVE Final   Comment:            The GeneXpert MRSA Assay (FDA     approved for NASAL specimens     only), is one component of a     comprehensive MRSA colonization     surveillance program. It is not     intended to diagnose MRSA     infection nor to guide or     monitor treatment for     MRSA infections.      Studies:  No results found. PATHOLOGY:  Bone, biopsy, Destructive rt iliac bone lesion - METASTATIC CARCINOMA. - SEE COMMENT. 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 A prognostic profile will be performed and the results reported separately. (JBK:gt, 09/02/13) Enid Cutter MD Pathologist, Electronic Signature (Case signed 09/02/2013) Specimen Gross and Clinical Information Specimen(s) Obtained: Bone, biopsy, Destructive rt iliac bone lesion Specimen Clinical Assessment: 58 y.o.  Results for ALIVIYAH, MALANGA (MRN [id number]) as of 09/02/2013 18:28  Ref. Range 08/29/2013 16:55  CA 27.29 Latest Range: 0-39 U/mL 20    Plan:   1. Metastatic Breast Cancer  -Mets to the spine, right acetabulum with cord compression, dura, axillary, supraclavicular LN, liver admitted with right leg pain and fever.  --Discussed pathology consistent with above.  ER+ confirmed by Dr. Lyndon Code.  We will start femara 2.5 mg daily.  We discussed extensively the  indications, benefits of treatment and  risks including but not limited to osteoporosis, increased risks of clots, hot flashes, transaminitis.  Understanding these risks and benefits and it indication as palliation of her breast cancer, she chose to proceed.  If also Her 2 negative, I think combination therapy with palbociclib 125 mg daily (3 weeks on and 1 week off) plus femara 2.5 mg daily will be a reasonable option. Sharla Kidney, et. [facility] 2015). We will await HER2 testing results which should be available by mid next week.  We will schedule a follow up on 09/11/2013 next week.  --Palliative XRT per Radiation oncology.    2. PE secondary to #1 and immobilization --Lovenox 60 mg q 12 hours.   3. ECOG 3.   4. Hypercalcemia, resolved.  --On admission, her Ca levels were 11.5 in the setting of malignancy, now 9.6   5. Anemia of chronic disease. --Normocytic.  Patient asymptomatic presently.   6. Mildly elevated liver enzymes.  --Avoid hepatotoxins.  Normal bilirubin.  7. Hypoalbuminemia --Ensure and boost with meals.   8. Disposition. --Full code.   Mckenize Mezera, MD 09/05/2013  9:29 AM

## 2013-09-05 NOTE — Progress Notes (Signed)
[facility] On[facility] Dept Therapy Treatment Record Phone [phone]   Radiation Therapy was administered to Kristie Jackson on: 09/05/2013  12:25 PM and was treatment # 2 out of a planned course of 10 treatments.

## 2013-09-05 NOTE — Progress Notes (Signed)
Pt for discharge home with home health services.  Pt opted to transport home via non-emergency ambulance as pt fatigued following therapy session this morning. Pt needing reclining wheelchair which was ordered and [facility] plans to deliver to pt home. CSW notified pt caregiver to take other wheelchair home in order for [facility] to switch out wheelchairs at the home.  CSW submitted pt clinicals to [facility] for ambulance authorization. Will notify [facility] of authorization number once provided.  CSW arranged ambulance transport for pt to home. CSW confirmed address at bedside. Non-emergency transport arranged via [facility].   CSW provided pt and pt caregiver calendar of pt upcoming radiation appointments in order for pt to schedule [facility].   Pt expressed great appreciation for assistance with disposition planning. Pt very pleased that she is able to return home with her caregivers and have wheelchair to transport to and from radiation treatments via wheelchair van. Pt coping well with treatments and is eager to get home and begin working with home health physical therapy.  No further social work needs identified at this time.  CSW signing off.   Alison Murray, MSW, [place] Work [phone]

## 2013-09-05 NOTE — Progress Notes (Signed)
Department of Radiation Oncology  Phone:  [phone] Fax:        [phone]   INPATIENT   Weekly Treatment Note    Name: Kristie Jackson Date: 09/05/2013 MRN: [id number] DOB: [birth year]   Current dose: 6 Gy  Current fraction: 2   MEDICATIONS: No current facility-administered medications for this encounter.   Current Outpatient Prescriptions  Medication Sig Dispense Refill  . dexamethasone (DECADRON) 4 MG tablet Take 1 tablet (4 mg total) by mouth 4 (four) times daily.  120 tablet  5  . enoxaparin (LOVENOX) 60 MG/0.6ML injection Inject 0.6 mLs (60 mg total) into the skin every 12 (twelve) hours.  60 Syringe  5  . letrozole (FEMARA) 2.5 MG tablet Take 1 tablet (2.5 mg total) by mouth daily.  30 tablet  2  . oxyCODONE-acetaminophen (PERCOCET/ROXICET) 5-325 MG per tablet Take 1-2 tablets by mouth every 6 (six) hours as needed for severe pain.  240 tablet  0  . polyethylene glycol (MIRALAX / GLYCOLAX) packet Take 17 g by mouth daily as needed for mild constipation, moderate constipation or severe constipation.  14 each  0  . senna-docusate (SENOKOT-S) 8.6-50 MG per tablet Take 2 tablets by mouth at bedtime.  60 tablet  5   Facility-Administered Medications Ordered in Other Encounters  Medication Dose Route Frequency Provider Last Rate Last Dose  . acetaminophen (TYLENOL) tablet 650 mg  650 mg Oral Q6H PRN Marissa Sciacca, PA-C   650 mg at 08/27/13 2042  . dexamethasone (DECADRON) tablet 4 mg  4 mg Oral 4 times per day [name], MD   4 mg at 09/05/13 1148  . diazepam (VALIUM) tablet 10 mg  10 mg Oral Q8H PRN Allie Bossier, MD   10 mg at 09/05/13 1148  . DULoxetine (CYMBALTA) DR capsule 60 mg  60 mg Oral QHS Shanda Howells, MD   60 mg at 09/04/13 2127  . enoxaparin (LOVENOX) injection 60 mg  60 mg Subcutaneous Q12H Rogue Bussing, RPH   60 mg at 09/05/13 0931  . feeding supplement (ENSURE COMPLETE) (ENSURE COMPLETE) liquid 237 mL  237 mL Oral BID BM Rogue Bussing, RD   237 mL at 09/04/13 1400  . gabapentin (NEURONTIN) capsule 1,200 mg  1,200 mg Oral TID Horton Finer, MD   1,200 mg at 09/05/13 0931  . HYDROmorphone (DILAUDID) injection 0.5 mg  0.5 mg Intravenous Q4H PRN Rhetta Mura Schorr, NP   0.5 mg at 09/01/13 1555  . HYDROmorphone (DILAUDID) injection 1 mg  1 mg Intravenous Q4H PRN [name], MD   1 mg at 09/04/13 1510  . letrozole [facility]) tablet 2.5 mg  2.5 mg Oral Daily Concha Norway, MD   2.5 mg at 09/05/13 1148  . levothyroxine (SYNTHROID, LEVOTHROID) tablet 75 mcg  75 mcg Oral Once per day on Sun Tue Wed Fri Sat Shanda Howells, MD   75 mcg at 09/05/13 [phone]  . ondansetron (ZOFRAN) injection 4 mg  4 mg Intravenous Q6H PRN Allie Bossier, MD      . ondansetron [facility]) injection 4 mg  4 mg Intravenous Once Greggory Keen, MD      . oxyCODONE-acetaminophen (PERCOCET/ROXICET) 5-325 MG per tablet 1-2 tablet  1-2 tablet Oral Q6H PRN Allie Bossier, MD   2 tablet at 09/05/13 1204  . polyethylene glycol (MIRALAX / GLYCOLAX) packet 17 g  17 g Oral Daily PRN [name], MD      . senna-docusate (Senokot-S) tablet  2 tablet  2 tablet Oral QHS [name], MD   2 tablet at 09/01/13 2223  . simvastatin (ZOCOR) tablet 40 mg  40 mg Oral Daily Shanda Howells, MD   40 mg at 09/05/13 0931  . topiramate (TOPAMAX) tablet 100 mg  100 mg Oral QHS Shanda Howells, MD   100 mg at 09/04/13 2127     ALLERGIES: Morphine and related   LABORATORY DATA:  Lab Results  Component Value Date   WBC 12.8* 09/05/2013   HGB 11.7* 09/05/2013   HCT 36.9 09/05/2013   MCV 92.9 09/05/2013   PLT 391 09/05/2013   Lab Results  Component Value Date   NA 141 09/04/2013   K 4.4 09/04/2013   CL 103 09/04/2013   CO2 28 09/04/2013   Lab Results  Component Value Date   ALT 50* 09/01/2013   AST 39* 09/01/2013   ALKPHOS 103 09/01/2013   BILITOT <0.2* 09/01/2013     NARRATIVE: Kristie Jackson was seen today for weekly treatment management. The chart was  checked and the patient's films were reviewed. The patient states that she has done fine with her first 2 treatments. No difficulties or complaints other than the fact that her first day was quite long with extra films. She had a much shorter overall treatment today which she was pleased with. She remains an inpatient.  PHYSICAL EXAMINATION: vitals were not taken for this visit.     alert, in no acute distress, lying in a hospital bed when she was examined near the treatment room.  ASSESSMENT: The patient is doing satisfactorily with treatment.  PLAN: We will continue with the patient's radiation treatment as planned.

## 2013-09-05 NOTE — Progress Notes (Signed)
Occupational Therapy Treatment Patient Details Name: Kristie Jackson MRN: [id number] DOB: [birth year] Today's Date: 09/05/2013    History of present illness 58 yo female admitted with sepsis, encephalopathy, PE, bil LE paralysis. 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 Obstructive lesion to L3. ? paraneoplastic syndrome per chart. Hx of spinal cord injury. Non ambulatory for ~ last 3 months. from home with caregiver.   OT comments  Session focused on caregiver education and equipment assessment  Follow Up Recommendations  Home health OT;Supervision/Assistance - 24 hour :  Pt needs non-emergency ambulance transportation home due to decreased sitting tolerance   Equipment Recommendations  Wheelchair (measurements OT);Wheelchair cushion (measurements OT) (high back reclining/removable armrests/elevating legrests)    Recommendations for Other Services      Precautions / Restrictions Precautions Precautions: Fall Restrictions Weight Bearing Restrictions: No       Mobility Bed Mobility               General bed mobility comments: pivoted pt to eob with pad, total A x 2  Transfers                 General transfer comment: sliding board transfer with total A x 2 to w/c.  Educated caregivers    Balance Overall balance assessment: Needs assistance     Sitting balance - Comments: sat eob x 2 minutes in prepation for sliding board transfer with min A for safety                           ADL                                         General ADL Comments: OT/PT saw pt prior to d/c home today to educate caregivers and further assess DME.   Pt has a hospital bed at home, and we raised HOB and used pad to scoot her to eob.  Pt was not dizzy at eob.  Educated caregivers on positioning sliding board under hips properly and weight shifting/lifting leg to minimize pain.  Amanda assisted with sliding board transfer back to bed.  Pt sat in w/c x 15 minutes.  She was shaky  due to fatique.  Pt does not have 2 caregivers with her all the time but she always has 2 for transfers.  She would benefit from high back reclining w/c with removable armrests and elevating leg rests.  She also needs w/c cushion.  Pt has a sliding board at home.  She is incontinent so does not need 3:1 at this time.    Pt will need non-emergency transporation to home after radiation due to decreased sitting tolerance and fatique.  HHPT/OT requested for further caregiver education and strengthening to maximize pt's participation in adls and mobility      Vision                     Perception     Praxis      Cognition   Behavior During Therapy: Self [facility] for tasks assessed/performed Overall Cognitive Status: Within Functional Limits for tasks assessed                       Extremity/Trunk Assessment               Exercises     Shoulder Instructions  General Comments      Pertinent Vitals/ Pain       Pt had back pain:  Not rated.  High initially when she got into w/c and decreased somewhat when legs positioned on legrests.  Repositioned back in bed and pt requested pain medication  Home Living                                          Prior Functioning/Environment              Frequency       Progress Toward Goals  OT Goals(current goals can now be found in the care plan section)  Progress towards OT goals: Progressing toward goals     Plan      Co-evaluation    PT/OT/SLP Co-Evaluation/Treatment: Yes Reason for Co-Treatment: Complexity of the patient's impairments (multi-system involvement);For patient/therapist safety PT goals addressed during session: Mobility/safety with mobility OT goals addressed during session: Strengthening/ROM      End of Session     Activity Tolerance Patient limited by fatigue;Patient limited by pain   Patient Left in bed;with call bell/phone within reach   Nurse Communication           Time: 8850-2774 OT Time Calculation (min): 52 min  Charges: OT General Charges $OT Visit: 1 Procedure OT Treatments $Therapeutic Activity: 23-37 mins  Kristie Jackson 09/05/2013, 12:36 PM  Lesle Chris, OTR/L [phone] 09/05/2013

## 2013-09-05 NOTE — Discharge Summary (Signed)
Physician Discharge Summary  Patient ID: Kristie Jackson MRN: [id number] DOB/AGE: May 05, 1955 58 y.o.  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 She has a history of spinal cord injury with semi-functional paraplegia, hypothyroidism and recent diagnosis of multiple spinal metastases who presented with temperatures 102 and some mental status changes. At admission she was given a 2 L normal saline bolus with good response. She was started on IV antibiotics empirically. Chest x-ray showed atelectasis but no pneumonia. Blood cultures were taken and remained negative. A source of infection was not found and patient had no further fever during hospitalization. Antibiotics were stopped after cultures remained negative. A CT scan of the brain showed no acute change, there were multiple focal skull metastases. A CT scan of the chest showed segmental pulmonary embolism the right lower lobe. Findings were consistent with right breast carcinoma with axillary and pectoral node spread. There were L1 and L3 pathologic compression fractures and retropulsion of the extraosseous tumor at the L3 level with spinal canal stenosis. There is also a 5 cm liver hemangioma, focal thickening in the right bladder and borderline enlarged right supraclavicular lymph node. The patient had MRI of her brain and entire spine showing osseous metastases in the skull, pathologic fracture anterior aspect of T1  diffusely infiltrated by tumor and metastases to the level of the cervical spine. There was extensive metastatic disease throughout the thoracic spine with subtle pathologic fracture T5. There is diffuse metastatic disease throughout the lumbar spine with compression fracture L3 protruding into the spinal canal and compressing the left lateral recess and left neural foramen L3-4. There is slight pathologic compression L1. MRI brain showed extensive skull metastases but no parenchymal metastases. The patient was seen by Dr. Ellene Route of neurosurgery who felt there is no indication for surgical intervention at the L3 level. She was seen by Dr. Tammi Klippel of the radiation oncology service who recommended 10 radiation therapy to the T5 area, L-spine to sacrum and right hip for palliation. The patient did undergo simulation and received 2 treatments of radiation therapy prior to discharge. Interventional radiology did a biopsy of the right iliac and acetabular lesion which was consistent with adenocarcinoma of the breast, strongly ER positive, HER2 testing pending. The patient had been seen by Dr. [name] of oncology who is planning on outpatient therapy depending on final outcome of pathology. The patient does have significant pain and has been taking oxycodone 10 mg with acetaminophen 4 times a day for pain control as well as Valium as needed. She has significant leg weakness and is unable to handle it on her own. A new manual wheelchair was obtained and she will be discharged home with home physical therapy and 24-hour care. She consider nursing home placement but opted for home care  The patient did have a pulmonary embolus seen on her initial CT scan and was placed on heparin IV and then Lovenox. She'll continue Lovenox as an outpatient. She also had some hypercalcemia at admission with calcium as high as 11.5 with albumin 2.7, her calcium level normalized with  IV fluids, at discharge calcium 10.0. The patient did develop  some constipation and was placed on Senokot and MiraLAX was used with good results as needed.  Consults: hematology/oncology and Neurosurgery, radiation oncology  Significant Diagnostic Studies: labs: At discharge sodium 141, potassium 4.4, chloride 103, bicarbonate 28 CBC WC 15.6, hemoglobin 10.2, platelet count 399 and radiology: MRI: As above and CT scan: As above  Treatments: IV hydration, antibiotics: vancomycin and Zosyn, anticoagulation: heparin and LMW heparin, steroids:  Dexamethasonetherapies: PT and OT, procedures:  Right iliac bone biopsyand radiation therapy  Discharge Exam: Blood pressure 119/60, pulse 55, temperature 98.5 F (36.9 C), temperature source Oral, resp. rate 20, height _0  (1.727 m), weight 64.2 kg (141 lb 8.6 oz), SpO2 96.00%. General appearance: alert and cooperative Resp: clear to auscultation bilaterally Cardio: regular rate and rhythm, S1, S2 normal, no murmur, click, rub or gallop GI: soft, non-tender; bowel sounds normal; no masses,  no organomegaly  Disposition: 01-Home or Self Care, she has 24-hour care at home and home physical and occupational therapy. Manual wheelchair obtained      Medication List         acetaminophen 500 MG tablet  Commonly known as:  TYLENOL  Take 1,000 mg by mouth every 6 (six) hours as needed for moderate pain.     dexamethasone 4 MG tablet  Commonly known as:  DECADRON  Take 1 tablet (4 mg total) by mouth 4 (four) times daily.     diazepam 10 MG tablet  Commonly known as:  VALIUM  Take 10 mg by mouth every 6 (six) hours as needed. For muscle spasms     DULoxetine 60 MG capsule  Commonly known as:  CYMBALTA  Take 60 mg by mouth at bedtime.     enoxaparin 60 MG/0.6ML injection  Commonly known as:  LOVENOX  Inject 0.6 mLs (60 mg total) into the skin every 12 (twelve) hours.     gabapentin 600 MG tablet  Commonly known as:  NEURONTIN  Take 1,200 mg by mouth 3 (three) times daily.     levothyroxine 75 MCG  tablet  Commonly known as:  SYNTHROID, LEVOTHROID  Take 75 mcg by mouth See admin instructions. Takes daily except Monday and Thursday     oxyCODONE-acetaminophen 5-325 MG per tablet  Commonly known as:  PERCOCET/ROXICET  Take 1-2 tablets by mouth every 6 (six) hours as needed for severe pain.     oxyCODONE-acetaminophen 5-325 MG per tablet  Commonly known as:  PERCOCET/ROXICET  Take 1-2 tablets by mouth every 6 (six) hours as needed for severe pain.     polyethylene glycol packet  Commonly known as:  MIRALAX / GLYCOLAX  Take 17 g by mouth daily as needed for mild constipation, moderate constipation or severe constipation.     rizatriptan 10 MG tablet  Commonly known as:  MAXALT  Take 10 mg by mouth as needed for migraine. May repeat in 2 hours if needed     senna-docusate 8.6-50 MG per tablet  Commonly known as:  Senokot-S  Take 2 tablets by mouth at bedtime.     simvastatin 40 MG tablet  Commonly known as:  ZOCOR  Take 40 mg by mouth daily.     tiZANidine 4 MG tablet  Commonly known as:  ZANAFLEX  Take 8 mg by mouth 3 (three) times daily.     topiramate 100 MG tablet  Commonly known as:  TOPAMAX  Take 100 mg by mouth at bedtime.  Follow-up Information   Follow up with [facility]. (home health physical and occupational therapy)    Contact information:   [address] [place] [address] [phone]       Follow up with [facility]. (wheelchair and cusions)    Contact information:   [address] [place] [address] [phone]       Follow up with Horton Finer, MD. (As needed)    Specialty:  Internal Medicine   Contact information:   [address], [place] [address] [phone]       Follow up with Lora Paula, MD.   Specialty:  Radiation Oncology   Contact information:   [address] [place] [address] [phone]        Signed: [name] 09/05/2013, 6:12 AM

## 2013-09-05 NOTE — Progress Notes (Signed)
Pt discharged home via EMS transport; pt stable at time of discharge; no questions/concerns; pt IV removed without complications; stated understanding

## 2013-09-05 NOTE — Progress Notes (Signed)
Physical Therapy Treatment Patient Details Name: Kristie Jackson MRN: [id number] DOB: 03-20-1955 Today's Date: 09/05/2013    History of Present Illness 58 yo female admitted with sepsis, encephalopathy, PE, bil LE paralysis. 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 Obstructive lesion to L3. ? paraneoplastic syndrome per chart. Hx of spinal cord injury. Non ambulatory for ~ last 3 months. from home with caregiver.    PT Comments    Pt tolerated session well today. This was her first time up in many days. No dizziness or diaphoretic symptoms, she was excited to be up in the [facility] even though we recommend a reclining back WC with elevating or articulating leg rest for her to help her change positions for fatigue and for back pain issues. She will still need a cushion as well. Recommend transport supine and not in car for safety reasons due to this short time up in [facility] really fatigued her today.    Follow Up Recommendations  Home health PT (pt and family perfers to return home with home health services and her caregivers as they are now)     Equipment Recommendations  Wheelchair (measurements PT);Wheelchair cushion (measurements PT) (recommend a reclining back WC due to weakness of trunk, inability to sit upright iwith pain in back, she will have to change positions and recline at times due to fatigue and pain. )    Recommendations for Other Services OT consult     Precautions / Restrictions Precautions Precautions: Fall Restrictions Weight Bearing Restrictions: No    Mobility  Bed Mobility Overal bed mobility: Needs Assistance;+2 for physical assistance Bed Mobility: Supine to Sit;Sit to Supine Rolling: +2 for physical assistance;Mod assist   Supine to sit: +2 for physical assistance;Max assist Sit to supine: +2 for physical assistance;Max assist   General bed mobility comments: pivoted pt on pad with head of bed up . Had to totally asist LEs and cradle due to pain with movement especially in  RLE.  Transfers Overall transfer level: Needs assistance   Transfers: Lateral/Scoot Transfers          Lateral/Scoot Transfers: +2 physical assistance;Total assist;With slide board General transfer comment: perfomed two person , one in front and one person behind due to pt's weakness in trunk stability. Caregiver Estill Bamberg present adn began doing sliding board transfer training and education. Used sliding board to go to wheelchair with armrest removed, and then the aregiver was a part of the transfer from [facility] back to bed.   Ambulation/Gait                 Stairs            Wheelchair Mobility    Modified Rankin (Stroke Patients Only)       Balance Overall balance assessment: Needs assistance Sitting-balance support: Bilateral upper extremity supported;Feet supported   Sitting balance - Comments: Pateint varied for time at EOB (little greater than 2 minutes) at times pt was min A and then at times more mod to MaxA due to fatigue EOB and pain in back with trunk at 90 degrees sitting.  Postural control: Posterior lean                          Cognition Arousal/Alertness: Awake/alert Behavior During Therapy: WFL for tasks assessed/performed Overall Cognitive Status: Within Functional Limits for tasks assessed                      Exercises  General Comments        Pertinent Vitals/Pain Pain reported in her back and RLE with movement and especially at 90 degree sitting positions.     Home Living                      Prior Function            PT Goals (current goals can now be found in the care plan section) Acute Rehab PT Goals Patient Stated Goal: I think we can work on this at home and hopefully begin doing these sliding board transfers PT Goal Formulation: With patient Time For Goal Achievement: September 17, 2013 Potential to Achieve Goals: Good Progress towards PT goals: Progressing toward goals    Frequency  Min  3X/week    PT Plan Discharge plan needs to be updated    Co-evaluation PT/OT/SLP Co-Evaluation/Treatment: Yes Reason for Co-Treatment: Complexity of the patient's impairments (multi-system involvement) PT goals addressed during session: Mobility/safety with mobility OT goals addressed during session: Strengthening/ROM     End of Session   Activity Tolerance: Patient tolerated treatment well (pt did have pain in back but stated " I am a tough cookie". She anticiapted pain in her R leg and in her back . ) Patient left: in bed;with call bell/phone within reach;with family/visitor present (pt was then on her way to radiation )     Time: 7902-4097 PT Time Calculation (min): 52 min  Charges:  $Therapeutic Activity: 8-22 mins                    G CodesClide Dales 09/17/13, 2:07 PM Clide Dales, PT Pager: [phone] 09/17/13

## 2013-09-05 NOTE — Progress Notes (Signed)
  Radiation Oncology         [phone]) [phone] ________________________________  Name: Kristie Jackson MRN: [id number]  Date: 09/04/2013  DOB: 04/25/1955  Simulation Verification Note   NARRATIVE: The patient was brought to the treatment unit and placed in the planned treatment position. The clinical setup was verified. Then port films were obtained and uploaded to the radiation oncology medical record software.  The treatment beams were carefully compared against the planned radiation fields. The position, location, and shape of the radiation fields was reviewed. The targeted volume of tissue appears to be appropriately covered by the radiation beams. Based on my personal review, I approved the simulation verification. The patient's treatment will proceed as planned.  ________________________________   Jodelle Gross, MD, PhD

## 2013-09-08 ENCOUNTER — Ambulatory Visit: Payer: Medicare Other

## 2013-09-08 ENCOUNTER — Ambulatory Visit
Admit: 2013-09-08 | Discharge: 2013-09-08 | Disposition: A | Discharge status: Home / Self Care | Payer: Medicare Other | Attending: Radiation Oncology | Admitting: Radiation Oncology

## 2013-09-08 ENCOUNTER — Telehealth: Payer: Self-pay | Admitting: Internal Medicine

## 2013-09-08 ENCOUNTER — Telehealth: Payer: Self-pay | Admitting: Radiation Oncology

## 2013-09-08 DIAGNOSIS — Z79899 Other long term (current) drug therapy: Secondary | ICD-10-CM | POA: Diagnosis not present

## 2013-09-08 DIAGNOSIS — C50919 Malignant neoplasm of unspecified site of unspecified female breast: Secondary | ICD-10-CM | POA: Diagnosis not present

## 2013-09-08 DIAGNOSIS — S32009A Unspecified fracture of unspecified lumbar vertebra, initial encounter for closed fracture: Secondary | ICD-10-CM | POA: Diagnosis not present

## 2013-09-08 DIAGNOSIS — Z7901 Long term (current) use of anticoagulants: Secondary | ICD-10-CM | POA: Diagnosis not present

## 2013-09-08 DIAGNOSIS — E039 Hypothyroidism, unspecified: Secondary | ICD-10-CM | POA: Diagnosis not present

## 2013-09-08 DIAGNOSIS — F329 Major depressive disorder, single episode, unspecified: Secondary | ICD-10-CM | POA: Diagnosis not present

## 2013-09-08 DIAGNOSIS — F3289 Other specified depressive episodes: Secondary | ICD-10-CM | POA: Diagnosis not present

## 2013-09-08 DIAGNOSIS — Z510 Encounter for antineoplastic radiation therapy: Secondary | ICD-10-CM | POA: Diagnosis not present

## 2013-09-08 DIAGNOSIS — F17200 Nicotine dependence, unspecified, uncomplicated: Secondary | ICD-10-CM | POA: Diagnosis not present

## 2013-09-08 DIAGNOSIS — C7951 Secondary malignant neoplasm of bone: Secondary | ICD-10-CM | POA: Diagnosis not present

## 2013-09-08 DIAGNOSIS — C7952 Secondary malignant neoplasm of bone marrow: Secondary | ICD-10-CM | POA: Diagnosis not present

## 2013-09-08 NOTE — Telephone Encounter (Signed)
S/W PATIENT AND GAVE NP HOSP F/U FOR 07/29 @ 11 W/DR. CHISM.

## 2013-09-08 NOTE — Telephone Encounter (Signed)
Patient discharged from the hospital over the weekend. Phoned patient's home to inquire if she plan to present for her 4:45 radiation treatment. Patient confirms transportation has been arranged and she plans to be present. Informed Faith, RT on L2 of this finding.

## 2013-09-09 ENCOUNTER — Other Ambulatory Visit: Payer: Self-pay | Admitting: Internal Medicine

## 2013-09-09 ENCOUNTER — Ambulatory Visit: Payer: Medicare Other

## 2013-09-09 ENCOUNTER — Ambulatory Visit
Admission: RE | Admit: 2013-09-09 | Discharge: 2013-09-09 | Disposition: A | Discharge status: Home / Self Care | Payer: Medicare Other | Source: Ambulatory Visit | Attending: Radiation Oncology | Admitting: Radiation Oncology

## 2013-09-09 DIAGNOSIS — C50919 Malignant neoplasm of unspecified site of unspecified female breast: Secondary | ICD-10-CM

## 2013-09-09 DIAGNOSIS — Z510 Encounter for antineoplastic radiation therapy: Secondary | ICD-10-CM | POA: Diagnosis not present

## 2013-09-10 ENCOUNTER — Ambulatory Visit: Payer: Medicare Other

## 2013-09-10 ENCOUNTER — Ambulatory Visit (HOSPITAL_BASED_OUTPATIENT_CLINIC_OR_DEPARTMENT_OTHER): Payer: Medicare Other | Admitting: Internal Medicine

## 2013-09-10 ENCOUNTER — Other Ambulatory Visit (HOSPITAL_BASED_OUTPATIENT_CLINIC_OR_DEPARTMENT_OTHER): Payer: Medicare Other

## 2013-09-10 ENCOUNTER — Telehealth: Payer: Self-pay | Admitting: Internal Medicine

## 2013-09-10 ENCOUNTER — Encounter: Payer: Self-pay | Admitting: Internal Medicine

## 2013-09-10 ENCOUNTER — Ambulatory Visit
Admit: 2013-09-10 | Discharge: 2013-09-10 | Disposition: A | Discharge status: Home / Self Care | Payer: Medicare Other | Attending: Radiation Oncology | Admitting: Radiation Oncology

## 2013-09-10 VITALS — BP 116/82 | HR 78 | Temp 97.8°F | Resp 18 | Ht 68.0 in

## 2013-09-10 DIAGNOSIS — G893 Neoplasm related pain (acute) (chronic): Secondary | ICD-10-CM

## 2013-09-10 DIAGNOSIS — C7952 Secondary malignant neoplasm of bone marrow: Secondary | ICD-10-CM

## 2013-09-10 DIAGNOSIS — C50919 Malignant neoplasm of unspecified site of unspecified female breast: Secondary | ICD-10-CM

## 2013-09-10 DIAGNOSIS — C7951 Secondary malignant neoplasm of bone: Secondary | ICD-10-CM

## 2013-09-10 DIAGNOSIS — C773 Secondary and unspecified malignant neoplasm of axilla and upper limb lymph nodes: Secondary | ICD-10-CM

## 2013-09-10 DIAGNOSIS — I2699 Other pulmonary embolism without acute cor pulmonale: Secondary | ICD-10-CM

## 2013-09-10 DIAGNOSIS — C787 Secondary malignant neoplasm of liver and intrahepatic bile duct: Secondary | ICD-10-CM

## 2013-09-10 DIAGNOSIS — E43 Unspecified severe protein-calorie malnutrition: Secondary | ICD-10-CM

## 2013-09-10 DIAGNOSIS — E039 Hypothyroidism, unspecified: Secondary | ICD-10-CM

## 2013-09-10 DIAGNOSIS — Z510 Encounter for antineoplastic radiation therapy: Secondary | ICD-10-CM | POA: Diagnosis not present

## 2013-09-10 DIAGNOSIS — F3289 Other specified depressive episodes: Secondary | ICD-10-CM

## 2013-09-10 DIAGNOSIS — F329 Major depressive disorder, single episode, unspecified: Secondary | ICD-10-CM

## 2013-09-10 LAB — CBC WITH DIFFERENTIAL/PLATELET
BASO%: 0.2 % (ref 0.0–2.0)
Basophils Absolute: 0 10*3/uL (ref 0.0–0.1)
EOS%: 0.1 % (ref 0.0–7.0)
Eosinophils Absolute: 0 10*3/uL (ref 0.0–0.5)
HCT: 36 % (ref 34.8–46.6)
HGB: 11.7 g/dL (ref 11.6–15.9)
LYMPH%: 13 % — AB (ref 14.0–49.7)
MCH: 29.9 pg (ref 25.1–34.0)
MCHC: 32.5 g/dL (ref 31.5–36.0)
MCV: 91.9 fL (ref 79.5–101.0)
MONO#: 0.7 10*3/uL (ref 0.1–0.9)
MONO%: 11.2 % (ref 0.0–14.0)
NEUT#: 4.9 10*3/uL (ref 1.5–6.5)
NEUT%: 75.5 % (ref 38.4–76.8)
PLATELETS: 276 10*3/uL (ref 145–400)
RBC: 3.91 10*6/uL (ref 3.70–5.45)
RDW: 15.8 % — ABNORMAL HIGH (ref 11.2–14.5)
WBC: 6.6 10*3/uL (ref 3.9–10.3)
lymph#: 0.9 10*3/uL (ref 0.9–3.3)

## 2013-09-10 LAB — BASIC METABOLIC PANEL (CC13)
ANION GAP: 9 meq/L (ref 3–11)
BUN: 20.6 mg/dL (ref 7.0–26.0)
CO2: 25 mEq/L (ref 22–29)
Calcium: 10.3 mg/dL (ref 8.4–10.4)
Chloride: 104 mEq/L (ref 98–109)
Creatinine: 0.6 mg/dL (ref 0.6–1.1)
Glucose: 82 mg/dl (ref 70–140)
Potassium: 4.1 mEq/L (ref 3.5–5.1)
SODIUM: 138 meq/L (ref 136–145)

## 2013-09-10 NOTE — Progress Notes (Signed)
Checked in patient for hosp follow up. She has appt card. And her daughter is ok to sign for her.

## 2013-09-10 NOTE — Patient Instructions (Signed)
Zoledronic Acid injection (Hypercalcemia, Oncology) What is this medicine? ZOLEDRONIC ACID (ZOE le dron ik AS id) lowers the amount of calcium loss from bone. It is used to treat too much calcium in your blood from cancer. It is also used to prevent complications of cancer that has spread to the bone. This medicine may be used for other purposes; ask your health care provider or pharmacist if you have questions. COMMON BRAND NAME(S): Zometa What should I tell my health care provider before I take this medicine? They need to know if you have any of these conditions: -aspirin-sensitive asthma -cancer, especially if you are receiving medicines used to treat cancer -dental disease or wear dentures -infection -kidney disease -receiving corticosteroids like dexamethasone or prednisone -an unusual or allergic reaction to zoledronic acid, other medicines, foods, dyes, or preservatives -pregnant or trying to get pregnant -breast-feeding How should I use this medicine? This medicine is for infusion into a vein. It is given by a health care professional in a hospital or clinic setting. Talk to your pediatrician regarding the use of this medicine in children. Special care may be needed. Overdosage: If you think you have taken too much of this medicine contact a poison control center or emergency room at once. NOTE: This medicine is only for you. Do not share this medicine with others. What if I miss a dose? It is important not to miss your dose. Call your doctor or health care professional if you are unable to keep an appointment. What may interact with this medicine? -certain antibiotics given by injection -NSAIDs, medicines for pain and inflammation, like ibuprofen or naproxen -some diuretics like bumetanide, furosemide -teriparatide -thalidomide This list may not describe all possible interactions. Give your health care provider a list of all the medicines, herbs, non-prescription drugs, or  dietary supplements you use. Also tell them if you smoke, drink alcohol, or use illegal drugs. Some items may interact with your medicine. What should I watch for while using this medicine? Visit your doctor or health care professional for regular checkups. It may be some time before you see the benefit from this medicine. Do not stop taking your medicine unless your doctor tells you to. Your doctor may order blood tests or other tests to see how you are doing. Women should inform their doctor if they wish to become pregnant or think they might be pregnant. There is a potential for serious side effects to an unborn child. Talk to your health care professional or pharmacist for more information. You should make sure that you get enough calcium and vitamin D while you are taking this medicine. Discuss the foods you eat and the vitamins you take with your health care professional. Some people who take this medicine have severe bone, joint, and/or muscle pain. This medicine may also increase your risk for jaw problems or a broken thigh bone. Tell your doctor right away if you have severe pain in your jaw, bones, joints, or muscles. Tell your doctor if you have any pain that does not go away or that gets worse. Tell your dentist and dental surgeon that you are taking this medicine. You should not have major dental surgery while on this medicine. See your dentist to have a dental exam and fix any dental problems before starting this medicine. Take good care of your teeth while on this medicine. Make sure you see your dentist for regular follow-up appointments. What side effects may I notice from receiving this medicine? Side effects that   you should report to your doctor or health care professional as soon as possible: -allergic reactions like skin rash, itching or hives, swelling of the face, lips, or tongue -anxiety, confusion, or depression -breathing problems -changes in vision -eye pain -feeling faint or  lightheaded, falls -jaw pain, especially after dental work -mouth sores -muscle cramps, stiffness, or weakness -trouble passing urine or change in the amount of urine Side effects that usually do not require medical attention (report to your doctor or health care professional if they continue or are bothersome): -bone, joint, or muscle pain -constipation -diarrhea -fever -hair loss -irritation at site where injected -loss of appetite -nausea, vomiting -stomach upset -trouble sleeping -trouble swallowing -weak or tired This list may not describe all possible side effects. Call your doctor for medical advice about side effects. You may report side effects to FDA at 1-800-FDA-1088. Where should I keep my medicine? This drug is given in a hospital or clinic and will not be stored at home. NOTE: This sheet is a summary. It may not cover all possible information. If you have questions about this medicine, talk to your doctor, pharmacist, or health care provider.  2015, [employer]. (2012-07-11 13:03:13) Bone Metastases Cancerous growths can begin in any part of the body. The original site of cancer is called the primary tumor or primary cancer (for example, breast cancer). After cancer has developed in one area of the body, cancerous cells from that area can break away and travel through the body's bloodstream. If these cancerous cells begin growing in another place in the body, they are called metastases. Bone metastases are cancer cells that have spread to the bone (which is different from a cancer that starts in the bone). These secondary growths are like the original tumor. For example, if a prostate cancer spreads to bone it is called metastatic prostate cancer, or prostate cancer metastatic to bone, but not bone cancer. Cancers can spread to almost any bone; the spine and pelvis are often involved.  Any type of cancer can spread to the bone, but the most common are breast, lung,  kidney, thyroid and prostate cancers. Sometimes the primary tumor is not discovered until there are bone problems. If the primary cancer location cannot be discovered, the cancer is called cancer of unknown primary location. SYMPTOMS  Pain in the bones is the main symptom of bone metastases. Some other problems may occur first including:  Decreased appetite.  Nausea.  Muscle weakness.  Confusion.  Unusual sleep patterns due to discomfort.  Overly tired (fatigue).  Restlessness. Frail or brittle bones may lead to broken bones (fractures) that lead to learning what is wrong (diagnosis). A tumor often weakens the bones.  DIAGNOSIS  Metastatic cancers may be found months or years after or at the same time as the primary tumor. When a second tumor is found in a patient who has been treated for cancer, it is more often a metastasis than another primary tumor.  The patient's symptoms, physical examination, X-rays and blood tests may suggest a bone metastases. In addition, an examination of tissue or a cell sample (biopsy) is usually done to find the cancer. This sample is removed with a needle. This tissue sample must be looked at under a microscope to confirm a diagnosis. TREATMENT  Options generally include treatments that give relief from symptoms (palliative) or curative. Those with advanced, metastasized cancer may receive treatment focused on pain relief and prolonging life. These treatments depend on the type of cancer and  its location.  Treatment for cancer depends on its type and location. Some of these treatments are:  Surgery to remove the original tumor and/or to remove parts of the body that produce hormones and other chemicals that make cancer worse.  Treatment with drugs (chemotherapy).  Bone marrow transplantations on rare occasions.  Radiation therapy (radiotherapy).  Hormonal therapy.  Pain relieving medications. Your caregiver will help you understand the likelihood  that any particular treatment will be helpful for you. While some treatments aim to cure or control the cancer, others give relief from symptoms only. If you have bone metastases, radiation therapy may be recommended to treat pain (if it is in one main location). Pain medications are available. These include strong medicines like morphine. You may be instructed to take a long-acting pain medication (to control most of your pain) and a short-acting medication to control occasional flares of pain. Pain medication is sometimes also given continuously through a pump. HOME CARE INSTRUCTIONS   Take medications exactly as prescribed.  Keep any follow-up appointments.  Pain medications can make you sleepy or confused. Do not drive, climb ladders, or do other dangerous activities while on pain medication.  Pain medications often cause constipation. Ask your caregiver for information on stool softeners.  Do not share your pain medication with others. SEEK MEDICAL CARE IF:   Your bone pain is not controlled.  You are having problems or side effects from your medication.  You have excessive sleepiness or confusion. SEEK IMMEDIATE MEDICAL CARE IF:   You fall and have any injury or pain from the fall.  You have trouble walking.  You have numbness or tingling in your legs.  You develop a sudden significant worsening of your pain. Document Released: 01/20/2002 Document Revised: 04/24/2011 Document Reviewed: 09/13/2007 [facility] Patient Information 2015 [place], [place]. This information is not intended to replace advice given to you by your health care provider. Make sure you discuss any questions you have with your health care provider. Breast Cancer Breast cancer is an abnormal growth of tissue (tumor) in the breast that is cancerous (malignant). Unlike noncancerous (benign) tumors, malignant tumors can spread to other parts of your body. The most common type of female breast cancer begins in the milk  ducts (ductal carcinoma). Breast cancer is one of the most common types of cancer in women. CAUSES  The exact cause of female breast cancer is unknown.  RISK FACTORS  Age older than 61 years.  Family history of breast cancer.  Having the BRCA1 and BRCA2 genes.  Personal history of radiation exposure.  Obesity.  Menstrual periods that begin before age 95 years.  Menopause that begins after age 55 years.  Pregnant for the first time at the age of 39 years or older.  Using hormone therapy.  Drinking more than one alcoholic drink per day. SIGNS AND SYMPTOMS   A painless lump in your breast.  Changes in the size or shape of your breast.  Breast skin changes, such as puckering or dimpling.  Nipple abnormalities, such as scaling, crustiness, redness, or pulling in (retraction).  Nipple discharge that is bloody or clear. DIAGNOSIS  Your health care provider will ask about your medical history. He or she may also perform a number of procedures, such as:  A physical exam. This will involve feeling the tissue around the breast and under the arms.  Taking a sample of nipple discharge. The sample will be examined under a microscope.  Breast X-rays (mammogram), breast ultrasound exams,  or an MRI.  Taking a tissue sample (biopsy) from the breast. The sample will be examined under a microscope to look for cancer cells. Your cancer will be staged to determine its severity and extent. Staging is a careful attempt to find out the size of the tumor, whether the cancer has spread, and if so, to what parts of the body. You may need to have more tests to determine the stage of your cancer:  Stage 0--The tumor has not spread to other breast tissue.  Stage I--The cancer is only found in the breast. The tumor may be up to  in (2 cm) wide.  Stage II--The cancer has spread to nearby lymph nodes. The tumor may be up to 2 in (5 cm) wide.  Stage III--The cancer has spread to more distant lymph  nodes. The tumor may be larger than 2 in (5 cm) wide.  Stage IV--The cancer has spread to other parts of the body, such as the bones, brain, liver, or lungs. TREATMENT  Depending on the type and stage, female breast cancer may be treated with one or more of the following therapies:  Surgery to remove just the tumor (lumpectomy) or the entire breast (mastectomy). Lymph nodes may also be removed.  Radiation therapy, which uses high-energy rays to kill cancer cells.  Chemotherapy, which is the use of drugs to kill cancer cells.  Hormone therapy, which involves taking medicine to adjust the hormone levels in your body. You may take medicine to decrease your estrogen levels. This can help stop cancer cells from growing. HOME CARE INSTRUCTIONS   Take medicines only as directed by your health care provider.  Maintain a healthy diet.  Consider joining a support group. This may help you learn to cope with the stress of having breast cancer.  Keep all follow-up appointments as directed by your health care provider. SEEK MEDICAL CARE IF:  You have a sudden increase in pain.  You notice a new lump in either breast or under your arm.  You develop swelling in either arm or hand.  You lose weight without trying.  You have a fever.  You notice new fatigue or weakness. SEEK IMMEDIATE MEDICAL CARE IF:   You have chest pain or trouble breathing.  You faint. Document Released: 05/10/2005 Document Revised: 06/16/2013 Document Reviewed: 03/26/2013 [facility] Patient Information 2015 [place], [place]. This information is not intended to replace advice given to you by your health care provider. Make sure you discuss any questions you have with your health care provider. Letrozole tablets What is this medicine? LETROZOLE (LET roe zole) blocks the production of estrogen. Certain types of breast cancer grow under the influence of estrogen. Letrozole helps block tumor growth. This medicine is used to  treat advanced breast cancer in postmenopausal women. This medicine may be used for other purposes; ask your health care provider or pharmacist if you have questions. COMMON BRAND NAME(S): Femara What should I tell my health care provider before I take this medicine? They need to know if you have any of these conditions: -liver disease -osteoporosis (weak bones) -an unusual or allergic reaction to letrozole, other medicines, foods, dyes, or preservatives -pregnant or trying to get pregnant -breast-feeding How should I use this medicine? Take this medicine by mouth with a glass of water. You may take it with or without food. Follow the directions on the prescription label. Take your medicine at regular intervals. Do not take your medicine more often than directed. Do not stop taking  except on your doctor's advice. Talk to your pediatrician regarding the use of this medicine in children. Special care may be needed. Overdosage: If you think you have taken too much of this medicine contact a poison control center or emergency room at once. NOTE: This medicine is only for you. Do not share this medicine with others. What if I miss a dose? If you miss a dose, take it as soon as you can. If it is almost time for your next dose, take only that dose. Do not take double or extra doses. What may interact with this medicine? Do not take this medicine with any of the following medications: -estrogens, like hormone replacement therapy or birth control pills This medicine may also interact with the following medications: -dietary supplements such as androstenedione or DHEA -prasterone -tamoxifen This list may not describe all possible interactions. Give your health care provider a list of all the medicines, herbs, non-prescription drugs, or dietary supplements you use. Also tell them if you smoke, drink alcohol, or use illegal drugs. Some items may interact with your medicine. What should I watch for while  using this medicine? Visit your doctor or health care professional for regular check-ups to monitor your condition. Do not use this drug if you are pregnant. Serious side effects to an unborn child are possible. Talk to your doctor or pharmacist for more information. You may get drowsy or dizzy. Do not drive, use machinery, or do anything that needs mental alertness until you know how this medicine affects you. Do not stand or sit up quickly, especially if you are an older patient. This reduces the risk of dizzy or fainting spells. What side effects may I notice from receiving this medicine? Side effects that you should report to your doctor or health care professional as soon as possible: -allergic reactions like skin rash, itching, or hives -bone fracture -chest pain -difficulty breathing or shortness of breath -severe pain, swelling, warmth in the leg -unusually weak or tired -vaginal bleeding Side effects that usually do not require medical attention (report to your doctor or health care professional if they continue or are bothersome): -bone, back, joint, or muscle pain -dizziness -fatigue -fluid retention -headache -hot flashes, night sweats -nausea -weight gain This list may not describe all possible side effects. Call your doctor for medical advice about side effects. You may report side effects to FDA at 1-800-FDA-1088. Where should I keep my medicine? Keep out of the reach of children. Store between 15 and 30 degrees C (59 and 86 degrees F). Throw away any unused medicine after the expiration date. NOTE: This sheet is a summary. It may not cover all possible information. If you have questions about this medicine, talk to your doctor, pharmacist, or health care provider.  2015, [employer]. (2007-04-12 16:43:44)

## 2013-09-10 NOTE — Telephone Encounter (Signed)
Pt confirmed labs/ov per 07/29 POF, gave pt AVS...KJ °

## 2013-09-10 NOTE — Progress Notes (Signed)
[facility] NOTE  [name], MD [address], [address] [place] [address]  DIAGNOSIS: Breast cancer metastasized to multiple sites, unspecified laterality  Pulmonary embolus  Protein-calorie malnutrition, severe  Chief Complaint  Patient presents with  . Stage IV Breast Cancer with bone mets    CURRENT TREATMENT: Femara 2.5 mg daily started on 09/05/2013.  INTERVAL HISTORY: Kristie Jackson 58 y.o. female    MEDICAL HISTORY: Past Medical History  Diagnosis Date  . Central hypothyroidism   . Hyperlipidemia   . Spinal cord injury   . Depression   . Cancer     spinal metastasis    INTERIM HISTORY: has Sepsis; Fracture of L3 vertebra; Fracture of L1 vertebra; Substance abuse; Acute encephalopathy; Protein-calorie malnutrition, severe; Hypercalcemia; Pulmonary embolus; and Breast cancer metastasized to multiple sites on her problem list.    ALLERGIES:  is allergic to morphine and related.  MEDICATIONS: has a current medication list which includes the following prescription(s): acetaminophen, dexamethasone, diazepam, duloxetine, enoxaparin, gabapentin, letrozole, levothyroxine, oxycodone-acetaminophen, polyethylene glycol, rizatriptan, simvastatin, tizanidine, and topiramate.  SURGICAL HISTORY:  Past Surgical History  Procedure Laterality Date  . Laminectomy    . Abdominal hysterectomy      REVIEW OF SYSTEMS:   Constitutional: Denies fevers, chills or abnormal weight loss Eyes: Denies blurriness of vision Ears, nose, mouth, throat, and face: Denies mucositis or sore throat Respiratory: Denies cough, dyspnea or wheezes Cardiovascular: Denies palpitation, chest discomfort or lower extremity swelling Gastrointestinal:  Denies nausea, heartburn or change in bowel habits Skin: Denies abnormal skin rashes Lymphatics: Denies new lymphadenopathy or easy bruising Neurological:Denies numbness, tingling or new  weaknesses Behavioral/Psych: Mood is stable, no new changes  All other systems were reviewed with the patient and are negative.  PHYSICAL EXAMINATION: ECOG PERFORMANCE STATUS: 2 - Symptomatic, <50% confined to bed  Blood pressure 116/82, pulse 78, temperature 97.8 F (36.6 C), temperature source Oral, resp. rate 18, height $RemoveBe'5\' 8"'sDmSmuRgQ$  (1.727 m), weight 0 lb (0 kg).  GENERAL:alert, no distress and comfortable; sitting in wheelchair comfortable.  SKIN: skin color, texture, turgor are normal, no rashes or significant lesions EYES: normal, Conjunctiva are pink and non-injected, sclera clear OROPHARYNX:no exudate, no erythema and lips, buccal mucosa, and tongue normal  NECK: supple, thyroid normal size, non-tender, without nodularity LYMPH:  no palpable lymphadenopathy in the cervical,  supraclavicular BREASTS: R breast mass measuring 8 x 8 cm with axillary lymph nodes (chain) on the right.  L breast without masses or nipple retraction.   LUNGS: clear to auscultation with normal breathing effort, no wheezes or rhonchi HEART: regular rate & rhythm and no murmurs and no lower extremity edema ABDOMEN:abdomen soft, non-tender and normal bowel sounds Musculoskeletal:no cyanosis of digits and no clubbing  NEURO: alert & oriented x 3 with fluent speech, lower extremity decreased movement (R greater than L) to the hips.  Labs:  Lab Results  Component Value Date   WBC 6.6 09/10/2013   HGB 11.7 09/10/2013   HCT 36.0 09/10/2013   MCV 91.9 09/10/2013   PLT 276 09/10/2013   NEUTROABS 4.9 09/10/2013      Chemistry      Component Value Date/Time   NA 141 09/04/2013 0404   K 4.4 09/04/2013 0404   CL 103 09/04/2013 0404   CO2 28 09/04/2013 0404   BUN 19 09/04/2013 0404   CREATININE 0.47* 09/04/2013 0404      Component Value Date/Time   CALCIUM 10.0 09/04/2013 0404   CALCIUM 10.0  08/28/2013 1000   ALKPHOS 103 09/01/2013 0346   AST 39* 09/01/2013 0346   ALT 50* 09/01/2013 0346   BILITOT <0.2* 09/01/2013 0346        Basic Metabolic Panel:  Recent Labs Lab 09/04/13 0404  NA 141  K 4.4  CL 103  CO2 28  GLUCOSE 113*  BUN 19  CREATININE 0.47*  CALCIUM 10.0   GFR The CrCl is unknown because both a height and weight (above a minimum accepted value) are required for this calculation. Liver Function Tests: No results found for this basename: AST, ALT, ALKPHOS, BILITOT, PROT, ALBUMIN,  in the last 168 hours No results found for this basename: LIPASE, AMYLASE,  in the last 168 hours No results found for this basename: AMMONIA,  in the last 168 hours Coagulation profile No results found for this basename: INR, PROTIME,  in the last 168 hours  CBC:  Recent Labs Lab 09/05/13 0431 09/10/13 1112  WBC 12.8* 6.6  NEUTROABS  --  4.9  HGB 11.7* 11.7  HCT 36.9 36.0  MCV 92.9 91.9  PLT 391 276    Anemia work up No results found for this basename: VITAMINB12, FOLATE, FERRITIN, TIBC, IRON, RETICCTPCT,  in the last 72 hours  Studies:  No results found.   RADIOGRAPHIC STUDIES: Ct Head Wo Contrast  08/28/2013   CLINICAL DATA:  The encephalopathy.  EXAM: CT HEAD WITHOUT CONTRAST  TECHNIQUE: Contiguous axial images were obtained from the base of the skull through the vertex without intravenous contrast.  COMPARISON:  None.  FINDINGS: Skull and Sinuses:There are ill-defined lytic lesions throughout the calvarium consistent with metastatic disease. The most notable lesions are in the right parietal and central occipital-parietal region measuring up to 3.5 cm in diameter. These lesions errode through the inner table, including at the level of the superior sagittal sinus.  Orbits: No acute abnormality.  Brain: No evidence of acute abnormality, such as acute infarction, hemorrhage, hydrocephalus, or mass lesion/mass effect. Apparent low-density in the medial right temporal lobe was evaluated on coronal reformats. This is in a region of streak artifact, the most likely cause. There is no mass effect in this  region. No evidence of low-density along the cingulate gyrus or insula.  IMPRESSION: 1. No acute intracranial findings. 2. Multi focal calvarial metastatic disease. There is multi focal erosion through the inner table, including over the superior sagittal sinus. Suggest enhanced MRI followup.   Electronically Signed   By: Jorje Guild M.D.   On: 08/28/2013 02:21   Ct Chest W Contrast  08/29/2013   CLINICAL DATA:  Evaluate for source of malignancy.  EXAM: CT CHEST, ABDOMEN, AND PELVIS WITH CONTRAST  TECHNIQUE: Multidetector CT imaging of the chest, abdomen and pelvis was performed following the standard protocol during bolus administration of intravenous contrast.  CONTRAST:  76mL OMNIPAQUE IOHEXOL 300 MG/ML  SOLN  COMPARISON:  None.  FINDINGS: CT CHEST FINDINGS  THORACIC INLET/BODY WALL:  There is asymmetric, spiculated sub areolar tissue on the right. There is right axillary and deep pectoral lymphadenopathy with cortical thickening up to 1 cm. There is a 7 mm right supraclavicular lymph node which is moderately suspicious.  Bilateral thyroid nodules with coarse calcification, measuring up to 2 cm on the right. This is likely incidental given the patient's overall clinical status.  MEDIASTINUM:  Normal heart size. No pericardial effusion. There is residual thymus noted. Filling present within segmental arteries of the right lower lobe. No lymphadenopathy.  LUNG WINDOWS:  No definitive  metastasis. Small bilateral pleural effusions with dependent atelectasis.  OSSEOUS:  See below  CT ABDOMEN AND PELVIS FINDINGS  BODY WALL: Unremarkable.  Liver: There is a 5 cm mass in segment 6 which has peripheral nodular enhancement and centripetal fill-in. There is a 5 mm and 7 mm low-density nodules within the right hepatic lobe which may enhance on delayed imaging.  Biliary: Prominent intrahepatic biliary tree without visible cause.  Pancreas: Prominent diameter of the main pancreatic duct, measuring 4 mm. There is no  visible mass to cause obstruction.  Spleen: Unremarkable.  Adrenals: Unremarkable.  Kidneys and ureters: 4 mm nonobstructive stone in the interpolar left kidney.  Bladder: Focal thickening and enhancement along the right bladder base, measuring up to 7 mm in thickness. There is no excretory contrast to suggest layering hyperdense urine.  Reproductive: Hysterectomy.  Probable salpingo oophorectomies.  Bowel: Possible constipation. No bowel obstruction. No evidence of bowel inflammation.  Retroperitoneum: No mass or adenopathy.  Peritoneum: No ascites or pneumoperitoneum.  Vascular: No acute abnormality.  OSSEOUS: There is diffuse osteolytic and sclerotic bone metastases.  Notable deposits:  1. Extensive demineralization of the right pelvis, centered on the acetabulum, with extra osseous growth. No evidence of acute fracture. 2. 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 There is extra osseous tumor at the level of the left pedicle, which infiltrates the canal and left foramen. 3. L1 compression fracture with height loss approximately 25%. 4. Multiple spinal metastases likely have extra osseous tumor extension, including T5 with perispinal growth on the right. The T5 body is mildly compressed on the right, without discrete fracture line.  Critical Value/emergent results were called by telephone at the time of interpretation on 08/29/2013 at 1:24 am to [place], who verbally acknowledged these results.  IMPRESSION: 1. Segmental pulmonary embolism to the right lower lobe. 2. Findings consistent with right breast carcinoma with axillary/pectoral nodal spread and diffuse osseous metastatic disease. 3. L1 and L3 pathologic compression fractures. Retropulsion and extraosseous tumor at L3 causes advanced spinal canal and left foraminal stenosis. 4. Extensive demineralization of the right acetabulum, at significant risk for pathologic  fracture. 5. 5 cm hemangioma in the right liver. Two tiny low densities in the liver which can be followed by imaging. 6. Focal urothelial thickening in the right bladder, a possible second malignancy. 7. Borderline enlarged right supraclavicular lymph node.   Electronically Signed   By: Jorje Guild M.D.   On: 08/29/2013 01:31   Mr Jeri Cos HY Contrast  08/29/2013   CLINICAL DATA:  Extensive metastatic disease.  EXAM: MRI HEAD WITHOUT AND WITH CONTRAST  TECHNIQUE: Multiplanar, multiecho pulse sequences of the brain and surrounding structures were obtained without and with intravenous contrast.  CONTRAST:  70mL MULTIHANCE GADOBENATE DIMEGLUMINE 529 MG/ML IV SOLN  COMPARISON:  CT head without contrast 08/28/2013  FINDINGS: Multiple skull metastases are again noted. The largest lesions are in the parietal bones and at the apex of the occipital bone just below the lambdoid suture. The lesion in the right parietal bone extends to the dura with some dural thickening. There PE occipital lesion also extends to the dura adjacent to the superior sagittal sinus. Sinus is patent.  No parenchymal lesions are evident. No acute infarct, hemorrhage or parenchymal mass lesion is present. There is no significant white matter disease.  Flow is present in the major intracranial arteries. The globes and orbits are intact. The paranasal sinuses and  mastoid air cells are clear.  IMPRESSION: 1. Extensive skull metastases. 2. At least 2 of these lesions extend to the dura with focal dural enhancement suggesting dural metastases or reactive change associated with the adjacent skull metastasis. 3. No evidence for parenchymal metastases. 4. The MRI appearance of the brain is normal for age.   Electronically Signed   By: Lawrence Santiago M.D.   On: 08/29/2013 13:55   Mr Cervical Spine W Wo Contrast  08/29/2013   CLINICAL DATA:  Metastatic disease to the bones.  EXAM: MRI CERVICAL SPINE WITHOUT AND WITH CONTRAST  TECHNIQUE: Multiplanar  and multiecho pulse sequences of the cervical spine, to include the craniocervical junction and cervicothoracic junction, were obtained according to standard protocol without and with intravenous contrast.  CONTRAST:  105mL MULTIHANCE GADOBENATE DIMEGLUMINE 529 MG/ML IV SOLN  COMPARISON:  None.  FINDINGS: There are metastatic lesions involving every cervical vertebra as well as the clivus and the visualized portions of the occipital and parietal bones. There is a pathologic compression fracture of the anterior aspect of T1. There is no tumor protrusion into the spinal canal or neural foramina.  There is a small central disc protrusion at C5-6 which indents the ventral aspect of the spinal cord without myelopathy. This protrusion extends into the right neural foramen and osteophytes extend into the left neural foramen is could affect either or both C6 nerves.  Tumor is most prominent in the right lateral mass of C6 and in the left lateral mass of C7. There is edema in the posterior paraspinal musculature throughout the cervical spine.  The metastases enhance after contrast administration.  Lymphoid tissue is prominent at the base of the tongue.  There is a complex nodule in the right side of the thyroid gland.  The patient has had previous posterior decompression at C3-4.  There is a tiny focal area of abnormal signal in the left side of the spinal cord at that level which is most likely myelopathy secondary to the now relieved neural impingement.  IMPRESSION: 1. Osseous metastases in the visualized portions of the skull and at each level level of the cervical spine. No visible neural impingement by tumor at this time. 2. Pathologic fracture of the anterior aspect of T1. T1 is diffusely infiltrated by tumor. 3. No epidural tumor.   Electronically Signed   By: Rozetta Nunnery M.D.   On: 08/29/2013 13:33   Mr Thoracic Spine W Wo Contrast  08/29/2013   CLINICAL DATA:  Diffuse osseous metastases from unknown primary  tumor.  EXAM: MRI THORACIC SPINE WITHOUT AND WITH CONTRAST  TECHNIQUE: Multiplanar and multiecho pulse sequences of the thoracic spine were obtained without and with intravenous contrast.  CONTRAST:  86mL MULTIHANCE GADOBENATE DIMEGLUMINE 529 MG/ML IV SOLN  COMPARISON:  CT scan dated 08/28/2013  FINDINGS: There are metastatic lesions involving every vertebra in the thoracic spine. There is a pathologic compression fracture of the anterior aspect of T1. There is extensive tumor infiltration and T5, T7, T8, T10, T12, and L1.  Tumor extends into the left pedicle at T11 and slightly narrows the left neural foramen and slightly compresses the left posterior lateral aspect of the thecal sac but does not compress the spinal cord. There is a subtle pathologic compression fracture of the right side of the superior endplate of T5. No protrusion of bone or tumor or disc material into the spinal canal. No other pathologic fractures no visible epidural tumor.  IMPRESSION: 1. Extensive metastatic disease throughout the thoracic  spine with a subtle pathologic fracture of the superior aspect of T5. 2. No epidural tumor or neural impingement.   Electronically Signed   By: Rozetta Nunnery M.D.   On: 08/29/2013 13:57   Mr Lumbar Spine W Wo Contrast  08/29/2013   CLINICAL DATA:  Widespread metastatic disease to the bones.  EXAM: MRI LUMBAR SPINE WITHOUT AND WITH CONTRAST  TECHNIQUE: Multiplanar and multiecho pulse sequences of the lumbar spine were obtained without and with intravenous contrast.  CONTRAST:  2m MULTIHANCE GADOBENATE DIMEGLUMINE 529 MG/ML IV SOLN  COMPARISON:  CT scan dated 08/28/2013  FINDINGS: Conus tip is at the inferior aspect of L2.  Metastatic disease extensively involves the entire lumbar spine as well as the sacrum in the adjacent iliac bones. There is a pathologic compression fracture of the L3 vertebra with protrusion of the posterior aspect of the infiltrated vertebral body into the spinal canal compressing  the left side of the thecal sac in the left lateral recess. The tumor extends into the left pedicle, lamina, and facets and also extends into the left neural foramen at L3-4.  There is a slight compression deformity of the central portion of the L1 vertebra but there is no protrusion of bone or tumor or disc into the spinal canal at that level.  There is degenerative disc disease at L5-S1 with disc space narrowing and a 4 mm retrolisthesis with a small broad-based disc bulge with accompanying osteophytes. This touches both S1 nerves but does not compress them. There is fairly severe left foraminal stenosis at L5-S1.  There is diffuse edema in the paraspinal musculature.  After contrast administration there numerous metastases enhance.  IMPRESSION: 1. Diffuse metastatic disease throughout the spine with a compression fracture of L3. Infiltrated bone protrudes into the spinal canal and compresses the left lateral recess in the left neural foramen at L3-4. 2. Slight pathologic compression fracture of the central portion of L1 without neural impingement. 3. There is no definitive epidural tumor.   Electronically Signed   By: JRozetta NunneryM.D.   On: 08/29/2013 13:40   Ct Abdomen Pelvis W Contrast  08/29/2013   CLINICAL DATA:  Evaluate for source of malignancy.  EXAM: CT CHEST, ABDOMEN, AND PELVIS WITH CONTRAST  TECHNIQUE: Multidetector CT imaging of the chest, abdomen and pelvis was performed following the standard protocol during bolus administration of intravenous contrast.  CONTRAST:  841mOMNIPAQUE IOHEXOL 300 MG/ML  SOLN  COMPARISON:  None.  FINDINGS: CT CHEST FINDINGS  THORACIC INLET/BODY WALL:  There is asymmetric, spiculated sub areolar tissue on the right. There is right axillary and deep pectoral lymphadenopathy with cortical thickening up to 1 cm. There is a 7 mm right supraclavicular lymph node which is moderately suspicious.  Bilateral thyroid nodules with coarse calcification, measuring up to 2 cm on the  right. This is likely incidental given the patient's overall clinical status.  MEDIASTINUM:  Normal heart size. No pericardial effusion. There is residual thymus noted. Filling present within segmental arteries of the right lower lobe. No lymphadenopathy.  LUNG WINDOWS:  No definitive metastasis. Small bilateral pleural effusions with dependent atelectasis.  OSSEOUS:  See below  CT ABDOMEN AND PELVIS FINDINGS  BODY WALL: Unremarkable.  Liver: There is a 5 cm mass in segment 6 which has peripheral nodular enhancement and centripetal fill-in. There is a 5 mm and 7 mm low-density nodules within the right hepatic lobe which may enhance on delayed imaging.  Biliary: Prominent intrahepatic biliary tree without visible cause.  Pancreas: Prominent diameter of the main pancreatic duct, measuring 4 mm. There is no visible mass to cause obstruction.  Spleen: Unremarkable.  Adrenals: Unremarkable.  Kidneys and ureters: 4 mm nonobstructive stone in the interpolar left kidney.  Bladder: Focal thickening and enhancement along the right bladder base, measuring up to 7 mm in thickness. There is no excretory contrast to suggest layering hyperdense urine.  Reproductive: Hysterectomy.  Probable salpingo oophorectomies.  Bowel: Possible constipation. No bowel obstruction. No evidence of bowel inflammation.  Retroperitoneum: No mass or adenopathy.  Peritoneum: No ascites or pneumoperitoneum.  Vascular: No acute abnormality.  OSSEOUS: There is diffuse osteolytic and sclerotic bone metastases.  Notable deposits:  1. Extensive demineralization of the right pelvis, centered on the acetabulum, with extra osseous growth. No evidence of acute fracture. 2. 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 There is extra osseous tumor at the level of the left pedicle, which infiltrates the canal and left foramen. 3. L1 compression fracture with height loss  approximately 25%. 4. Multiple spinal metastases likely have extra osseous tumor extension, including T5 with perispinal growth on the right. The T5 body is mildly compressed on the right, without discrete fracture line.  Critical Value/emergent results were called by telephone at the time of interpretation on 08/29/2013 at 1:24 am to [place], who verbally acknowledged these results.  IMPRESSION: 1. Segmental pulmonary embolism to the right lower lobe. 2. Findings consistent with right breast carcinoma with axillary/pectoral nodal spread and diffuse osseous metastatic disease. 3. L1 and L3 pathologic compression fractures. Retropulsion and extraosseous tumor at L3 causes advanced spinal canal and left foraminal stenosis. 4. Extensive demineralization of the right acetabulum, at significant risk for pathologic fracture. 5. 5 cm hemangioma in the right liver. Two tiny low densities in the liver which can be followed by imaging. 6. Focal urothelial thickening in the right bladder, a possible second malignancy. 7. Borderline enlarged right supraclavicular lymph node.   Electronically Signed   By: Jorje Guild M.D.   On: 08/29/2013 01:31   Ct Biopsy  09/01/2013   CLINICAL DATA:  Extensive osseous metastatic disease, large right iliac/ acetabular destructive bone mass  EXAM: CT GUIDED CORE BIOPSY OF RIGHT ILIAC DESTRUCTIVE BONE MASS  ANESTHESIA/SEDATION: Two  Mg IV Versed; 100 mcg IV Fentanyl  Total Moderate Sedation Time: 15 minutes.  PROCEDURE: The procedure risks, benefits, and alternatives were explained to the patient. Questions regarding the procedure were encouraged and answered. The patient understands and consents to the procedure.  The right hip region was prepped with Betadinein a sterile fashion, and a sterile drape was applied covering the operative field. A sterile gown and sterile gloves were used for the procedure. Local anesthesia was provided with 1% Lidocaine.  Previous imaging reviewed. Patient  positioned supine. Noncontrast localization CT performed. The large right iliac destructive bone mass was localized. Under sterile conditions and local anesthesia, a 17 gauge 6.8 cm access needle was advanced percutaneously from a right anterior oblique approach. Needle position confirmed within the bone mass. 18 gauge core biopsies obtained. Samples placed in formalin. No immediate complication. Patient tolerated the biopsy well.  Complications: No immediate  FINDINGS: Imaging confirms needle placed in in the right iliac destructive bone mass.  IMPRESSION: Successful right iliac destructive bone mass 18 gauge core biopsy.   Electronically Signed   By: Daryll Brod M.D.   On: 09/01/2013 15:32   Dg Chest [facility]  08/27/2013   CLINICAL DATA:  Fever.  Recent diagnosis of cancer  EXAM: PORTABLE CHEST - 1 VIEW  COMPARISON:  None.  FINDINGS: Small streaky opacities in the lower lungs. No definitive pneumonia. No edema, effusion, or pneumothorax. There is pulmonary hyperinflation suggesting COPD. Normal heart size and upper mediastinal contours when accounting for distortion from rightward rotation.  IMPRESSION: COPD and mild basilar atelectasis. No definitive pneumonia to explain fever.   Electronically Signed   By: Jorje Guild M.D.   On: 08/27/2013 21:49    PATHOLOGY: 09/02/2013  Bone, biopsy, Destructive rt iliac bone lesion - METASTATIC CARCINOMA. - SEE COMMENT. 1 of 2 FINAL for DANNIELA, MCBREARTY ([id number]) Microscopic Comment Given the clinical findings, the features are consistent with metastatic grade II breast ductal carcinoma. A prognostic profile will be performed and the results reported separately. (JBK:gt, 09/02/13) Enid Cutter MD Pathologist, Electronic Signature (Case signed 09/02/2013)  PROGNOSTIC INDICATORS - ACIS Results: IMMUNOHISTOCHEMICAL AND MORPHOMETRIC ANALYSIS BY THE AUTOMATED CELLULAR IMAGING SYSTEM (ACIS) Estrogen Receptor: 100%, POSITIVE, STRONG STAINING  INTENSITY Progesterone Receptor: 0%, NEGATIVE Proliferation Marker Ki67: 53% COMMENT: The negative hormone receptor study(ies) in this case has no internal positive control. REFERENCE RANGE ESTROGEN RECEPTOR NEGATIVE <1% POSITIVE =>1% PROGESTERONE RECEPTOR NEGATIVE <1% POSITIVE =>1% All controls stained appropriately Enid Cutter MD Pathologist, Electronic Signature ( Signed 09/05/2013)  ASSESSMENT: Kristie Jackson 58 y.o. female with a history of Breast cancer metastasized to multiple sites, unspecified laterality  Pulmonary embolus  Protein-calorie malnutrition, severe   PLAN:   1. Metastatic Breast Cancer  complicated by L3 compression and moderate to advanced spinal stenosis -Mets to the spine, right acetabulum with cord compression, dura, axillary, supraclavicular LN, liver  --Discussed with pathology previously and consistent with above. 100% ER+ confirmed by Dr. Lyndon Code. We will CONTINUE femara 2.5 mg daily. We discussed extensively the indications, benefits of treatment and risks including but not limited to osteoporosis, increased risks of clots, hot flashes, transaminitis. Understanding these risks and benefits and it indication as palliation of her breast cancer, she chose to proceed. If also Her 2 negative, I think combination therapy with palbociclib 125 mg daily (3 weeks on and 1 week off) plus femara 2.5 mg daily will be a reasonable option. Sharla Kidney, et. [facility] 2015). We will await HER2 testing results which should be available by mid next week. We will schedule a follow up  In 2 weeks.     --Palliative XRT per Radiation oncology.   --We will consider addition of zometa or denosumab based on extensive skeletal metastases to help reduce risk of skeletal related events.  She will require a baseline dental evaluation.   --Monitoring for therapy was discussed.  Patient will consider having a metastatic skeletal survey for baseline.  Her C/A/P and MRI of brain and  spinal columns have been obtained.   2. R lower lobe segmental PE secondary to #1 and immobilization  --Lovenox 60 mg q 12 hours.   3. ECOG 3.   4. Hypothyroidism. --Continue Levothyroxine 47 mcg daily except Monday and Thursday.   5. Bladder/ Bowel incontinence secondary to #1.   6. Pain secondary to #1.  --Continue Percocet 1-2 tabs every 6 hours as need for pain.  Continue neurotin 1,2000 mg tid.   7. 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 --Secondary to #1.  Continuing XRT to affected site.  Patient is also on dexamethasone 4 mg every 6 hours.  This will likely be tapered upon completion of XRT.   8. History of central cord injury.   9. Depression. --Continue cymbalta 60 mg daily.   10. Follow up.  --Patient instructed to follow up in 2 weeks with discussion of bone survey and consideration of palbociclib and/or zometa.   All questions were answered. The patient knows to call the clinic with any problems, questions or concerns. We can certainly see the patient much sooner if necessary.  I spent 25 minutes counseling the patient face to face. The total time spent in the appointment was 40 minutes.    Carter Kassel, MD 09/10/2013 11:58 AM

## 2013-09-11 ENCOUNTER — Ambulatory Visit: Payer: Medicare Other

## 2013-09-11 ENCOUNTER — Inpatient Hospital Stay: Payer: Medicare Other

## 2013-09-11 ENCOUNTER — Ambulatory Visit
Admit: 2013-09-11 | Discharge: 2013-09-11 | Disposition: A | Discharge status: Home / Self Care | Payer: Medicare Other | Attending: Radiation Oncology | Admitting: Radiation Oncology

## 2013-09-11 ENCOUNTER — Other Ambulatory Visit: Payer: Medicare Other

## 2013-09-11 ENCOUNTER — Ambulatory Visit
Admission: RE | Admit: 2013-09-11 | Discharge: 2013-09-11 | Disposition: A | Discharge status: Home / Self Care | Payer: Medicare Other | Source: Ambulatory Visit | Attending: Radiation Oncology | Admitting: Radiation Oncology

## 2013-09-11 ENCOUNTER — Encounter: Payer: Self-pay | Admitting: Radiation Oncology

## 2013-09-11 VITALS — BP 117/80 | HR 89 | Temp 98.3°F | Ht 68.0 in

## 2013-09-11 DIAGNOSIS — C50919 Malignant neoplasm of unspecified site of unspecified female breast: Secondary | ICD-10-CM

## 2013-09-11 DIAGNOSIS — Z510 Encounter for antineoplastic radiation therapy: Secondary | ICD-10-CM | POA: Diagnosis not present

## 2013-09-11 NOTE — Progress Notes (Signed)
Ms. Kristie Jackson has received 6 fractions to her T12-S1/RHIP and T4-T6 spine.  She travels by [place]/C accompanied by her caregiver.  She c/o a stomach age of a level 9/10 today.  Had loose stool x one today.  Denies pain of her right hip at this time, and ststes that her back is "okay at the moment."

## 2013-09-11 NOTE — Progress Notes (Signed)
  Radiation Oncology         [phone]) [phone] ________________________________  Name: Kristie Jackson MRN: [id number]  Date: 09/11/2013  DOB: [birth year]  Weekly Radiation Therapy Management  Current Dose: 24 Gy     Planned Dose:  30 Gy  Narrative . . . . . . . . The patient presents for routine under treatment assessment.                                   The patient has some improvement in back pain.                                 Set-up films were reviewed.                                 The chart was checked. Physical Findings. . .  height is 5\' 8"  (1.727 m). Her temperature is 98.3 F (36.8 C). Her blood pressure is 117/80 and her pulse is 89. . Weight essentially stable.  No significant changes. Impression . . . . . . . The patient is tolerating radiation. Plan . . . . . . . . . . . . Continue treatment as planned.  ________________________________  Sheral Apley. Tammi Klippel, M.D.

## 2013-09-12 ENCOUNTER — Ambulatory Visit: Payer: Medicare Other

## 2013-09-12 ENCOUNTER — Telehealth: Payer: Self-pay | Admitting: Internal Medicine

## 2013-09-12 ENCOUNTER — Other Ambulatory Visit: Payer: Self-pay | Admitting: Internal Medicine

## 2013-09-12 MED ORDER — LAPATINIB DITOSYLATE 250 MG PO TABS: 1250.0000 mg | ORAL_TABLET | Freq: Every day | ORAL | Status: DC

## 2013-09-12 NOTE — Telephone Encounter (Signed)
s.w. pt caregiver and advised on appts... ok and aware

## 2013-09-12 NOTE — Progress Notes (Signed)
I spoke with the patient today at 9:30 am informing her that her HER2 results were positive.  Therefore, we will add lapatinib to letrozole based on Schwarzberg L et. Al, Oncologist, February 2010 vol. 15. No 2. 122-129.  This study indicated a significant lower risk of disease progression than with letrozole alone (HR, 0.71); PFS time was 8.2 months versus 3.0 months.  Clinical benefit rate was 48% versus 29 % with letrozole alone.  Common side effects were diarrhea and rash.   Patient understood the indication which is palliation of her stage IV metastatic breast cancer and risks including but not limited to hepatotoxicity, diarrhea, risk and agreed to proceed.  We will start lapatinib 1250 mg daily.

## 2013-09-15 ENCOUNTER — Telehealth: Payer: Self-pay | Admitting: Medical Oncology

## 2013-09-15 ENCOUNTER — Other Ambulatory Visit: Payer: Self-pay | Admitting: Medical Oncology

## 2013-09-15 ENCOUNTER — Ambulatory Visit: Payer: Medicare Other

## 2013-09-15 ENCOUNTER — Encounter: Payer: Self-pay | Admitting: Internal Medicine

## 2013-09-15 ENCOUNTER — Ambulatory Visit
Admit: 2013-09-15 | Discharge: 2013-09-15 | Disposition: A | Discharge status: Home / Self Care | Payer: Medicare Other | Attending: Radiation Oncology | Admitting: Radiation Oncology

## 2013-09-15 DIAGNOSIS — Z510 Encounter for antineoplastic radiation therapy: Secondary | ICD-10-CM | POA: Diagnosis not present

## 2013-09-15 MED ORDER — LAPATINIB DITOSYLATE 250 MG PO TABS: 1250.0000 mg | ORAL_TABLET | Freq: Every day | ORAL | Status: DC

## 2013-09-15 NOTE — Progress Notes (Signed)
Faxed tykerb pa form to [employer]

## 2013-09-16 ENCOUNTER — Ambulatory Visit: Payer: Medicare Other

## 2013-09-17 ENCOUNTER — Ambulatory Visit
Admission: RE | Admit: 2013-09-17 | Discharge: 2013-09-17 | Disposition: A | Discharge status: Home / Self Care | Payer: Medicare Other | Source: Ambulatory Visit | Attending: Radiation Oncology | Admitting: Radiation Oncology

## 2013-09-17 ENCOUNTER — Ambulatory Visit: Payer: Medicare Other

## 2013-09-17 ENCOUNTER — Telehealth: Payer: Self-pay | Admitting: Radiation Oncology

## 2013-09-17 ENCOUNTER — Encounter: Payer: Self-pay | Admitting: Internal Medicine

## 2013-09-17 DIAGNOSIS — Z510 Encounter for antineoplastic radiation therapy: Secondary | ICD-10-CM | POA: Diagnosis not present

## 2013-09-17 NOTE — Progress Notes (Signed)
Per [facility] patient has been approved for Tykerb 7500.00   09/16/13-09/16/14,sending to billing and medical records.

## 2013-09-17 NOTE — Telephone Encounter (Signed)
Patient did not show for radiation treatment yesterday nor today. Phoned patient to inquire about status. No answer. Left message requesting return call. Patient does not show to be hospitalized within the [facility] network.

## 2013-09-18 ENCOUNTER — Ambulatory Visit: Payer: Medicare Other | Admitting: Physical Therapy

## 2013-09-18 ENCOUNTER — Ambulatory Visit: Payer: Medicare Other

## 2013-09-18 ENCOUNTER — Ambulatory Visit
Admission: RE | Admit: 2013-09-18 | Discharge: 2013-09-18 | Disposition: A | Discharge status: Home / Self Care | Payer: Medicare Other | Source: Ambulatory Visit | Attending: Radiation Oncology | Admitting: Radiation Oncology

## 2013-09-18 ENCOUNTER — Ambulatory Visit: Payer: Medicare Other | Admitting: Radiation Oncology

## 2013-09-18 ENCOUNTER — Telehealth: Payer: Self-pay | Admitting: Radiation Oncology

## 2013-09-18 DIAGNOSIS — Z510 Encounter for antineoplastic radiation therapy: Secondary | ICD-10-CM | POA: Diagnosis not present

## 2013-09-18 NOTE — Telephone Encounter (Addendum)
Patient's daughter, [name], called. She is the patient's POA but, lives out of town. She questioned if her mother had completed her radiation therapy. Explained she mixed treatment on Monday and Tuesday and our office called her home but, got no answer nor return call. Reported the patient presented for treatment yesterday but, was extremely late. Verbalized if patient comes today as scheduled at 1200 she will complete treatment tomorrow. Daughter expressed her mother's caregiver is unreliable and that her mother is a poor historian. Daughter reports that she has a call into Dr. [name] to discuss prognosis so the family can plan for the future. This Probation officer promised to called Megan back with one month follow up appointment for Dr. Tammi Klippel once it is scheduled.

## 2013-09-19 ENCOUNTER — Ambulatory Visit: Payer: Medicare Other

## 2013-09-19 ENCOUNTER — Ambulatory Visit
Admission: RE | Admit: 2013-09-19 | Discharge: 2013-09-19 | Disposition: A | Discharge status: Home / Self Care | Payer: Medicare Other | Source: Ambulatory Visit | Attending: Radiation Oncology | Admitting: Radiation Oncology

## 2013-09-19 ENCOUNTER — Other Ambulatory Visit: Payer: Self-pay

## 2013-09-19 ENCOUNTER — Encounter: Payer: Self-pay | Admitting: Radiation Oncology

## 2013-09-19 ENCOUNTER — Telehealth: Payer: Self-pay

## 2013-09-19 ENCOUNTER — Ambulatory Visit: Payer: Medicare Other | Admitting: Radiation Oncology

## 2013-09-19 VITALS — BP 103/77 | HR 78 | Temp 97.9°F | Ht 68.0 in

## 2013-09-19 DIAGNOSIS — C50919 Malignant neoplasm of unspecified site of unspecified female breast: Secondary | ICD-10-CM

## 2013-09-19 DIAGNOSIS — Z510 Encounter for antineoplastic radiation therapy: Secondary | ICD-10-CM | POA: Diagnosis not present

## 2013-09-19 MED ORDER — DIPHENOXYLATE-ATROPINE 2.5-0.025 MG PO TABS: 1.0000 | ORAL_TABLET | Freq: Four times a day (QID) | ORAL | Status: DC | PRN

## 2013-09-19 NOTE — Progress Notes (Signed)
Kristie Jackson has completed treatment to her T4-T6 spine, T12 and R hip.  She reports pain at a 2/10 when not moving and 8/10 in her right leg above her knee up to her right hip and lower back.  She is taking percocet 3 times daily with good relief.  She is taking decadron 4 mg 4 times a day.  She reports that she was having discomfort with eating and starting taking Carafate and Nexium and feels better now.  She denies any trouble swallowing and nausea.  She reports having diarrhea with about 10 loose stools per day.  She says that Imodium does not work and is wondering if she can take lomotil.  She reports that her skin is intact in the treatment areas.

## 2013-09-19 NOTE — Progress Notes (Signed)
  Radiation Oncology         [phone]) [phone] ________________________________  Name: Kristie Jackson MRN: [id number]  Date: 09/19/2013  DOB: April 27, 1955  Weekly Radiation Therapy Management  Current Dose: 30 Gy     Planned Dose:  30 Gy  Narrative . . . . . . . . The patient presents for routine under treatment assessment.  Kristie Jackson has completed treatment to her T4-T6 spine, T12 and R hip. She reports pain at a 2/10 when not moving and 8/10 in her right leg above her knee up to her right hip and lower back. She is taking percocet 3 times daily with good relief. She is taking decadron 4 mg 4 times a day. She reports that she was having discomfort with eating and starting taking Carafate and Nexium and feels better now. She denies any trouble swallowing and nausea. She reports having diarrhea with about 10 loose stools per day. She says that Imodium does not work and is wondering if she can take lomotil. She reports that her skin is intact in the treatment areas                                 The patient is without complaint.                                 Set-up films were reviewed.                                 The chart was checked. Physical Findings. . .  height is 5\' 8"  (1.727 m). Her oral temperature is 97.9 F (36.6 C). Her blood pressure is 103/77 and her pulse is 78. Her oxygen saturation is 97%. . Weight essentially stable.  No significant changes. Impression . . . . . . . The patient is tolerating radiation. Plan . . . . . . . . . . . . Continue treatment as planned.  Given Lomotil, and follow-up on 10/23/13.  ________________________________  Sheral Apley. Tammi Klippel, M.D.

## 2013-09-19 NOTE — Telephone Encounter (Signed)
Kristie Jackson from [facility] called this am about new tykerb rx. We need a baseline EKG and the dexamethasone pt is taking can decrease the amount of tykerb in the body. S/w Burns Spain NP. Had an EKG done while pt in her XRT appt. Pt will begin titrating her dexamethasone now that her XRT is complete. Will ask Dr [name] about her titration schedule when he returns 8/12.

## 2013-09-22 ENCOUNTER — Ambulatory Visit: Payer: Medicare Other

## 2013-09-22 NOTE — Telephone Encounter (Signed)
1 

## 2013-09-23 ENCOUNTER — Telehealth: Payer: Self-pay | Admitting: Radiation Oncology

## 2013-09-23 ENCOUNTER — Ambulatory Visit: Payer: Medicare Other

## 2013-09-23 NOTE — Telephone Encounter (Signed)
As promised phoned patient's daughter and POA with follow up appointment dates and time. She verbalized appreciation for the call. She expressed she hasn't heard back from anyone in Dr. Boyce Medici office regarding prognosis. She verbalized, "we are hoping to hear back soon so we can begin to make future plans." This writer will route this message to Dr. [name] and his nurse.

## 2013-09-24 ENCOUNTER — Ambulatory Visit: Payer: Medicare Other

## 2013-09-25 ENCOUNTER — Telehealth: Payer: Self-pay

## 2013-09-25 ENCOUNTER — Other Ambulatory Visit: Payer: Self-pay | Admitting: Radiation Oncology

## 2013-09-25 NOTE — Telephone Encounter (Signed)
megan called at 45 asking about plan and prognosis for her mother. She is HCPOA. Called back and said plan at present is femara and tykerb. Pt has appt at [facility] tomorrow and I will ask Dr [name] to call [name] about prognosis.

## 2013-09-26 ENCOUNTER — Other Ambulatory Visit: Payer: Self-pay | Admitting: *Deleted

## 2013-09-26 ENCOUNTER — Ambulatory Visit (HOSPITAL_BASED_OUTPATIENT_CLINIC_OR_DEPARTMENT_OTHER): Payer: Medicare Other | Admitting: Physician Assistant

## 2013-09-26 ENCOUNTER — Other Ambulatory Visit (HOSPITAL_BASED_OUTPATIENT_CLINIC_OR_DEPARTMENT_OTHER): Payer: Medicare Other

## 2013-09-26 ENCOUNTER — Telehealth: Payer: Self-pay | Admitting: Internal Medicine

## 2013-09-26 ENCOUNTER — Other Ambulatory Visit: Payer: Self-pay | Admitting: Internal Medicine

## 2013-09-26 VITALS — BP 124/90 | HR 117 | Temp 98.3°F | Resp 18

## 2013-09-26 DIAGNOSIS — C7952 Secondary malignant neoplasm of bone marrow: Secondary | ICD-10-CM

## 2013-09-26 DIAGNOSIS — E039 Hypothyroidism, unspecified: Secondary | ICD-10-CM

## 2013-09-26 DIAGNOSIS — R197 Diarrhea, unspecified: Secondary | ICD-10-CM

## 2013-09-26 DIAGNOSIS — R32 Unspecified urinary incontinence: Secondary | ICD-10-CM

## 2013-09-26 DIAGNOSIS — F329 Major depressive disorder, single episode, unspecified: Secondary | ICD-10-CM

## 2013-09-26 DIAGNOSIS — I2699 Other pulmonary embolism without acute cor pulmonale: Secondary | ICD-10-CM

## 2013-09-26 DIAGNOSIS — C773 Secondary and unspecified malignant neoplasm of axilla and upper limb lymph nodes: Secondary | ICD-10-CM

## 2013-09-26 DIAGNOSIS — G893 Neoplasm related pain (acute) (chronic): Secondary | ICD-10-CM

## 2013-09-26 DIAGNOSIS — C787 Secondary malignant neoplasm of liver and intrahepatic bile duct: Secondary | ICD-10-CM

## 2013-09-26 DIAGNOSIS — C50919 Malignant neoplasm of unspecified site of unspecified female breast: Secondary | ICD-10-CM

## 2013-09-26 DIAGNOSIS — Z170 Estrogen receptor positive status [ER+]: Secondary | ICD-10-CM

## 2013-09-26 DIAGNOSIS — F3289 Other specified depressive episodes: Secondary | ICD-10-CM

## 2013-09-26 DIAGNOSIS — C7951 Secondary malignant neoplasm of bone: Secondary | ICD-10-CM

## 2013-09-26 LAB — CBC WITH DIFFERENTIAL/PLATELET
BASO%: 0.2 % (ref 0.0–2.0)
BASOS ABS: 0 10*3/uL (ref 0.0–0.1)
EOS ABS: 0 10*3/uL (ref 0.0–0.5)
EOS%: 0.1 % (ref 0.0–7.0)
HEMATOCRIT: 43.9 % (ref 34.8–46.6)
HGB: 14.4 g/dL (ref 11.6–15.9)
LYMPH%: 4.8 % — AB (ref 14.0–49.7)
MCH: 30.7 pg (ref 25.1–34.0)
MCHC: 32.7 g/dL (ref 31.5–36.0)
MCV: 93.9 fL (ref 79.5–101.0)
MONO#: 0.8 10*3/uL (ref 0.1–0.9)
MONO%: 12.2 % (ref 0.0–14.0)
NEUT#: 5.2 10*3/uL (ref 1.5–6.5)
NEUT%: 82.7 % — ABNORMAL HIGH (ref 38.4–76.8)
Platelets: 323 10*3/uL (ref 145–400)
RBC: 4.67 10*6/uL (ref 3.70–5.45)
RDW: 18.3 % — AB (ref 11.2–14.5)
WBC: 6.3 10*3/uL (ref 3.9–10.3)
lymph#: 0.3 10*3/uL — ABNORMAL LOW (ref 0.9–3.3)

## 2013-09-26 LAB — COMPREHENSIVE METABOLIC PANEL (CC13)
ALBUMIN: 3.5 g/dL (ref 3.5–5.0)
ALT: 25 U/L (ref 0–55)
ANION GAP: 12 meq/L — AB (ref 3–11)
AST: 16 U/L (ref 5–34)
Alkaline Phosphatase: 127 U/L (ref 40–150)
BUN: 16.1 mg/dL (ref 7.0–26.0)
CO2: 21 meq/L — AB (ref 22–29)
Calcium: 10 mg/dL (ref 8.4–10.4)
Chloride: 104 mEq/L (ref 98–109)
Creatinine: 0.5 mg/dL — ABNORMAL LOW (ref 0.6–1.1)
GLUCOSE: 115 mg/dL (ref 70–140)
POTASSIUM: 4.1 meq/L (ref 3.5–5.1)
Sodium: 137 mEq/L (ref 136–145)
Total Bilirubin: 0.27 mg/dL (ref 0.20–1.20)
Total Protein: 6.9 g/dL (ref 6.4–8.3)

## 2013-09-26 LAB — LACTATE DEHYDROGENASE (CC13): LDH: 233 U/L (ref 125–245)

## 2013-09-26 LAB — TECHNOLOGIST REVIEW

## 2013-09-26 MED ORDER — DIPHENOXYLATE-ATROPINE 2.5-0.025 MG PO TABS: 1.0000 | ORAL_TABLET | Freq: Four times a day (QID) | ORAL | Status: DC | PRN

## 2013-09-26 NOTE — Patient Instructions (Signed)
Decrease Decadron (dexamethasone) one tablet by mouth three times a day x 3 days. One tablet by mouth twice a day x 3 days, then, one tablet daily x 3 days. Then half a tablet by mouth x 3 days. Then stop.

## 2013-09-29 NOTE — Progress Notes (Signed)
  Radiation Oncology         [phone]) [phone] ________________________________  Name: Kristie Jackson MRN: [id number]  Date: 09/19/2013  DOB: 08-23-1955  End of Treatment Note  Diagnosis:   58 yo woman with skeletal metastases from breast cancer  Indication for treatment:  Palliation of pain, preservation of existing spinal cord function  Radiation treatment dates:   09/04/13-09/19/13  Site/dose:    1.  The thoracic spine from T4 to T6 inclusive was treated to 30 Gy in 10 fractions of 3 Gy. 2.  The lower spine from T12 through the sacrum and right hip were treated to 30 Gy in 10 fractions of 3 Gy.  Beams/energy:      1.  The thoracic spine from T4 to T6 inclusive was treated anterior and posterior 15 MV X-ray fields shaped to cover the targeted area while shielding the heart and lung maximally. 2.  The lower spine from T12 through the sacrum and right hip were treated anterior and posterior 15 MV X-rays with a reduced field compensating beam on the posterior angle to homogenize the dose.  Narrative: The patient tolerated radiation treatment relatively well.   Her pain improved.  She felt that her strength improved slightly.  Plan: The patient has completed radiation treatment. The patient will return to radiation oncology clinic for routine followup in one month. I advised them to call or return sooner if they have any questions or concerns related to their recovery or treatment. ________________________________  Kristie Jackson. Tammi Klippel, M.D.

## 2013-09-30 ENCOUNTER — Telehealth: Payer: Self-pay | Admitting: Radiation Oncology

## 2013-09-30 NOTE — Telephone Encounter (Signed)
Lomotil script found on printer. Phoned script into [facility] on [address].

## 2013-10-01 ENCOUNTER — Other Ambulatory Visit: Payer: Self-pay | Admitting: Physician Assistant

## 2013-10-01 ENCOUNTER — Telehealth: Payer: Self-pay

## 2013-10-01 NOTE — Telephone Encounter (Signed)
Pt returned call. Medication list at [facility] is correct.

## 2013-10-01 NOTE — Telephone Encounter (Signed)
lvm that we have a medication list from [facility] dated 09/08/13. There is not tykerb, femara or nexium,tylenol, nor carafate listed. Please call back to clarify medication list.

## 2013-10-02 NOTE — Progress Notes (Signed)
Filer NOTE  [name], MD [address], [address] [place] [address]  DIAGNOSIS: Breast cancer metastasized to multiple sites, unspecified laterality - Plan: CBC with Differential, Comprehensive metabolic panel (Cmet) - [facility], CA 27.29, DISCONTINUED: diphenoxylate-atropine (LOMOTIL) 2.5-0.025 MG per tablet  No chief complaint on file.   CURRENT TREATMENT:  1. Femara 2.5 mg daily started on 09/05/2013. 2. Tykerb 1250 mg by mouth daily beginning July 2050  INTERVAL HISTORY: Kristie Jackson 58 y.o. female  with metastatic breast cancer complicated by an L3 compression and moderate to advance spinal stenosis. Her pathology is 100% ER positive she is also HER-2 positive thus her combination therapy with Femara and type her. She is tolerating the therapy relatively well with the exception of some diarrhea. She requests a refill for her Lomotil tablets was originally prescribed by Dr. Tammi Klippel. She also requests a refill for her Carafate tablets. She is accompanied today by her caregiver Caryl Pina. She is also requesting refill for her Valium tablets however she will get this from her primary care physician who initially prescribed this medication. She reports improvement in her pain level.  MEDICAL HISTORY: Past Medical History  Diagnosis Date  . Central hypothyroidism   . Hyperlipidemia   . Spinal cord injury   . Depression   . Cancer     spinal metastasis    INTERIM HISTORY: has Sepsis; Fracture of L3 vertebra; Fracture of L1 vertebra; Substance abuse; Acute encephalopathy; Protein-calorie malnutrition, severe; Hypercalcemia; Pulmonary embolus; and Breast cancer metastasized to multiple sites on her problem list.    ALLERGIES:  is allergic to morphine and related.  MEDICATIONS: has a current medication list which includes the following prescription(s): dexamethasone, diazepam, duloxetine, enoxaparin, esomeprazole, lapatinib, letrozole,  levothyroxine, oxycodone-acetaminophen, simvastatin, sucralfate, topiramate, acetaminophen, diphenoxylate-atropine, diphenoxylate-atropine, gabapentin, polyethylene glycol, rizatriptan, and tizanidine.  SURGICAL HISTORY:  Past Surgical History  Procedure Laterality Date  . Laminectomy    . Abdominal hysterectomy      REVIEW OF SYSTEMS:   Constitutional: Denies fevers, chills or abnormal weight loss Eyes: Denies blurriness of vision Ears, nose, mouth, throat, and face: Denies mucositis or sore throat Respiratory: Denies cough, dyspnea or wheezes Cardiovascular: Denies palpitation, chest discomfort or lower extremity swelling Gastrointestinal:  Denies nausea, heartburn or change in bowel habits Skin: Denies abnormal skin rashes Lymphatics: Denies new lymphadenopathy or easy bruising Neurological:Denies numbness, tingling or new weaknesses Behavioral/Psych: Mood is stable, no new changes  All other systems were reviewed with the patient and are negative.  PHYSICAL EXAMINATION: ECOG PERFORMANCE STATUS: 2 - Symptomatic, <50% confined to bed  Blood pressure 124/90, pulse 117, temperature 98.3 F (36.8 C), temperature source Oral, resp. rate 18, weight 0 lb (0 kg).  GENERAL:alert, no distress and comfortable; sitting in wheelchair comfortable.  SKIN: skin color, texture, turgor are normal, no rashes or significant lesions EYES: normal, Conjunctiva are pink and non-injected, sclera clear OROPHARYNX:no exudate, no erythema and lips, buccal mucosa, and tongue normal  NECK: supple, thyroid normal size, non-tender, without nodularity LYMPH:  no palpable lymphadenopathy in the cervical,  supraclavicular BREASTS: exam deferred  LUNGS: clear to auscultation with normal breathing effort, no wheezes or rhonchi HEART: regular rate & rhythm and no murmurs and no lower extremity edema ABDOMEN:abdomen soft, non-tender and normal bowel sounds Musculoskeletal:no cyanosis of digits and no clubbing   NEURO: alert & oriented x 3 with fluent speech, lower extremity decreased movement (R greater than L) to the hips.  Labs:  Lab Results  Component Value Date   WBC 6.3 09/26/2013   HGB 14.4 09/26/2013   HCT 43.9 09/26/2013   MCV 93.9 09/26/2013   PLT 323 09/26/2013   NEUTROABS 5.2 09/26/2013      Chemistry      Component Value Date/Time   NA 137 09/26/2013 1401   NA 141 09/04/2013 0404   K 4.1 09/26/2013 1401   K 4.4 09/04/2013 0404   CL 103 09/04/2013 0404   CO2 21* 09/26/2013 1401   CO2 28 09/04/2013 0404   BUN 16.1 09/26/2013 1401   BUN 19 09/04/2013 0404   CREATININE 0.5* 09/26/2013 1401   CREATININE 0.47* 09/04/2013 0404      Component Value Date/Time   CALCIUM 10.0 09/26/2013 1401   CALCIUM 10.0 09/04/2013 0404   CALCIUM 10.0 08/28/2013 1000   ALKPHOS 127 09/26/2013 1401   ALKPHOS 103 09/01/2013 0346   AST 16 09/26/2013 1401   AST 39* 09/01/2013 0346   ALT 25 09/26/2013 1401   ALT 50* 09/01/2013 0346   BILITOT 0.27 09/26/2013 1401   BILITOT <0.2* 09/01/2013 0346       Basic Metabolic Panel:  Recent Labs Lab 09/26/13 1401  NA 137  K 4.1  CO2 21*  GLUCOSE 115  BUN 16.1  CREATININE 0.5*  CALCIUM 10.0   GFR The CrCl is unknown because both a height and weight (above a minimum accepted value) are required for this calculation. Liver Function Tests:  Recent Labs Lab 09/26/13 1401  AST 16  ALT 25  ALKPHOS 127  BILITOT 0.27  PROT 6.9  ALBUMIN 3.5   No results found for this basename: LIPASE, AMYLASE,  in the last 168 hours No results found for this basename: AMMONIA,  in the last 168 hours Coagulation profile No results found for this basename: INR, PROTIME,  in the last 168 hours  CBC:  Recent Labs Lab 09/26/13 1401  WBC 6.3  NEUTROABS 5.2  HGB 14.4  HCT 43.9  MCV 93.9  PLT 323    Anemia work up No results found for this basename: VITAMINB12, FOLATE, FERRITIN, TIBC, IRON, RETICCTPCT,  in the last 72 hours  Studies:  No results found.   RADIOGRAPHIC  STUDIES: Ct Head Wo Contrast  08/28/2013   CLINICAL DATA:  The encephalopathy.  EXAM: CT HEAD WITHOUT CONTRAST  TECHNIQUE: Contiguous axial images were obtained from the base of the skull through the vertex without intravenous contrast.  COMPARISON:  None.  FINDINGS: Skull and Sinuses:There are ill-defined lytic lesions throughout the calvarium consistent with metastatic disease. The most notable lesions are in the right parietal and central occipital-parietal region measuring up to 3.5 cm in diameter. These lesions errode through the inner table, including at the level of the superior sagittal sinus.  Orbits: No acute abnormality.  Brain: No evidence of acute abnormality, such as acute infarction, hemorrhage, hydrocephalus, or mass lesion/mass effect. Apparent low-density in the medial right temporal lobe was evaluated on coronal reformats. This is in a region of streak artifact, the most likely cause. There is no mass effect in this region. No evidence of low-density along the cingulate gyrus or insula.  IMPRESSION: 1. No acute intracranial findings. 2. Multi focal calvarial metastatic disease. There is multi focal erosion through the inner table, including over the superior sagittal sinus. Suggest enhanced MRI followup.   Electronically Signed   By: Jorje Guild M.D.   On: 08/28/2013 02:21   Ct Chest W Contrast  08/29/2013   CLINICAL DATA:  Evaluate  for source of malignancy.  EXAM: CT CHEST, ABDOMEN, AND PELVIS WITH CONTRAST  TECHNIQUE: Multidetector CT imaging of the chest, abdomen and pelvis was performed following the standard protocol during bolus administration of intravenous contrast.  CONTRAST:  38m OMNIPAQUE IOHEXOL 300 MG/ML  SOLN  COMPARISON:  None.  FINDINGS: CT CHEST FINDINGS  THORACIC INLET/BODY WALL:  There is asymmetric, spiculated sub areolar tissue on the right. There is right axillary and deep pectoral lymphadenopathy with cortical thickening up to 1 cm. There is a 7 mm right  supraclavicular lymph node which is moderately suspicious.  Bilateral thyroid nodules with coarse calcification, measuring up to 2 cm on the right. This is likely incidental given the patient's overall clinical status.  MEDIASTINUM:  Normal heart size. No pericardial effusion. There is residual thymus noted. Filling present within segmental arteries of the right lower lobe. No lymphadenopathy.  LUNG WINDOWS:  No definitive metastasis. Small bilateral pleural effusions with dependent atelectasis.  OSSEOUS:  See below  CT ABDOMEN AND PELVIS FINDINGS  BODY WALL: Unremarkable.  Liver: There is a 5 cm mass in segment 6 which has peripheral nodular enhancement and centripetal fill-in. There is a 5 mm and 7 mm low-density nodules within the right hepatic lobe which may enhance on delayed imaging.  Biliary: Prominent intrahepatic biliary tree without visible cause.  Pancreas: Prominent diameter of the main pancreatic duct, measuring 4 mm. There is no visible mass to cause obstruction.  Spleen: Unremarkable.  Adrenals: Unremarkable.  Kidneys and ureters: 4 mm nonobstructive stone in the interpolar left kidney.  Bladder: Focal thickening and enhancement along the right bladder base, measuring up to 7 mm in thickness. There is no excretory contrast to suggest layering hyperdense urine.  Reproductive: Hysterectomy.  Probable salpingo oophorectomies.  Bowel: Possible constipation. No bowel obstruction. No evidence of bowel inflammation.  Retroperitoneum: No mass or adenopathy.  Peritoneum: No ascites or pneumoperitoneum.  Vascular: No acute abnormality.  OSSEOUS: There is diffuse osteolytic and sclerotic bone metastases.  Notable deposits:  1. Extensive demineralization of the right pelvis, centered on the acetabulum, with extra osseous growth. No evidence of acute fracture. 2. 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 There is extra  osseous tumor at the level of the left pedicle, which infiltrates the canal and left foramen. 3. L1 compression fracture with height loss approximately 25%. 4. Multiple spinal metastases likely have extra osseous tumor extension, including T5 with perispinal growth on the right. The T5 body is mildly compressed on the right, without discrete fracture line.  Critical Value/emergent results were called by telephone at the time of interpretation on 08/29/2013 at 1:24 am to [place] who verbally acknowledged these results.  IMPRESSION: 1. Segmental pulmonary embolism to the right lower lobe. 2. Findings consistent with right breast carcinoma with axillary/pectoral nodal spread and diffuse osseous metastatic disease. 3. L1 and L3 pathologic compression fractures. Retropulsion and extraosseous tumor at L3 causes advanced spinal canal and left foraminal stenosis. 4. Extensive demineralization of the right acetabulum, at significant risk for pathologic fracture. 5. 5 cm hemangioma in the right liver. Two tiny low densities in the liver which can be followed by imaging. 6. Focal urothelial thickening in the right bladder, a possible second malignancy. 7. Borderline enlarged right supraclavicular lymph node.   Electronically Signed   By: JJorje GuildM.D.   On: 08/29/2013 01:31   Mr BJeri CosWHYContrast  08/29/2013   CLINICAL  DATA:  Extensive metastatic disease.  EXAM: MRI HEAD WITHOUT AND WITH CONTRAST  TECHNIQUE: Multiplanar, multiecho pulse sequences of the brain and surrounding structures were obtained without and with intravenous contrast.  CONTRAST:  13m MULTIHANCE GADOBENATE DIMEGLUMINE 529 MG/ML IV SOLN  COMPARISON:  CT head without contrast 08/28/2013  FINDINGS: Multiple skull metastases are again noted. The largest lesions are in the parietal bones and at the apex of the occipital bone just below the lambdoid suture. The lesion in the right parietal bone extends to the dura with some dural thickening. There  PE occipital lesion also extends to the dura adjacent to the superior sagittal sinus. Sinus is patent.  No parenchymal lesions are evident. No acute infarct, hemorrhage or parenchymal mass lesion is present. There is no significant white matter disease.  Flow is present in the major intracranial arteries. The globes and orbits are intact. The paranasal sinuses and mastoid air cells are clear.  IMPRESSION: 1. Extensive skull metastases. 2. At least 2 of these lesions extend to the dura with focal dural enhancement suggesting dural metastases or reactive change associated with the adjacent skull metastasis. 3. No evidence for parenchymal metastases. 4. The MRI appearance of the brain is normal for age.   Electronically Signed   By: CLawrence SantiagoM.D.   On: 08/29/2013 13:55   Mr Cervical Spine W Wo Contrast  08/29/2013   CLINICAL DATA:  Metastatic disease to the bones.  EXAM: MRI CERVICAL SPINE WITHOUT AND WITH CONTRAST  TECHNIQUE: Multiplanar and multiecho pulse sequences of the cervical spine, to include the craniocervical junction and cervicothoracic junction, were obtained according to standard protocol without and with intravenous contrast.  CONTRAST:  128mMULTIHANCE GADOBENATE DIMEGLUMINE 529 MG/ML IV SOLN  COMPARISON:  None.  FINDINGS: There are metastatic lesions involving every cervical vertebra as well as the clivus and the visualized portions of the occipital and parietal bones. There is a pathologic compression fracture of the anterior aspect of T1. There is no tumor protrusion into the spinal canal or neural foramina.  There is a small central disc protrusion at C5-6 which indents the ventral aspect of the spinal cord without myelopathy. This protrusion extends into the right neural foramen and osteophytes extend into the left neural foramen is could affect either or both C6 nerves.  Tumor is most prominent in the right lateral mass of C6 and in the left lateral mass of C7. There is edema in the  posterior paraspinal musculature throughout the cervical spine.  The metastases enhance after contrast administration.  Lymphoid tissue is prominent at the base of the tongue.  There is a complex nodule in the right side of the thyroid gland.  The patient has had previous posterior decompression at C3-4.  There is a tiny focal area of abnormal signal in the left side of the spinal cord at that level which is most likely myelopathy secondary to the now relieved neural impingement.  IMPRESSION: 1. Osseous metastases in the visualized portions of the skull and at each level level of the cervical spine. No visible neural impingement by tumor at this time. 2. Pathologic fracture of the anterior aspect of T1. T1 is diffusely infiltrated by tumor. 3. No epidural tumor.   Electronically Signed   By: JiRozetta Nunnery.D.   On: 08/29/2013 13:33   Mr Thoracic Spine W Wo Contrast  08/29/2013   CLINICAL DATA:  Diffuse osseous metastases from unknown primary tumor.  EXAM: MRI THORACIC SPINE WITHOUT AND WITH CONTRAST  TECHNIQUE: Multiplanar and multiecho pulse sequences of the thoracic spine were obtained without and with intravenous contrast.  CONTRAST:  84m MULTIHANCE GADOBENATE DIMEGLUMINE 529 MG/ML IV SOLN  COMPARISON:  CT scan dated 08/28/2013  FINDINGS: There are metastatic lesions involving every vertebra in the thoracic spine. There is a pathologic compression fracture of the anterior aspect of T1. There is extensive tumor infiltration and T5, T7, T8, T10, T12, and L1.  Tumor extends into the left pedicle at T11 and slightly narrows the left neural foramen and slightly compresses the left posterior lateral aspect of the thecal sac but does not compress the spinal cord. There is a subtle pathologic compression fracture of the right side of the superior endplate of T5. No protrusion of bone or tumor or disc material into the spinal canal. No other pathologic fractures no visible epidural tumor.  IMPRESSION: 1. Extensive  metastatic disease throughout the thoracic spine with a subtle pathologic fracture of the superior aspect of T5. 2. No epidural tumor or neural impingement.   Electronically Signed   By: JRozetta NunneryM.D.   On: 08/29/2013 13:57   Mr Lumbar Spine W Wo Contrast  08/29/2013   CLINICAL DATA:  Widespread metastatic disease to the bones.  EXAM: MRI LUMBAR SPINE WITHOUT AND WITH CONTRAST  TECHNIQUE: Multiplanar and multiecho pulse sequences of the lumbar spine were obtained without and with intravenous contrast.  CONTRAST:  163mMULTIHANCE GADOBENATE DIMEGLUMINE 529 MG/ML IV SOLN  COMPARISON:  CT scan dated 08/28/2013  FINDINGS: Conus tip is at the inferior aspect of L2.  Metastatic disease extensively involves the entire lumbar spine as well as the sacrum in the adjacent iliac bones. There is a pathologic compression fracture of the L3 vertebra with protrusion of the posterior aspect of the infiltrated vertebral body into the spinal canal compressing the left side of the thecal sac in the left lateral recess. The tumor extends into the left pedicle, lamina, and facets and also extends into the left neural foramen at L3-4.  There is a slight compression deformity of the central portion of the L1 vertebra but there is no protrusion of bone or tumor or disc into the spinal canal at that level.  There is degenerative disc disease at L5-S1 with disc space narrowing and a 4 mm retrolisthesis with a small broad-based disc bulge with accompanying osteophytes. This touches both S1 nerves but does not compress them. There is fairly severe left foraminal stenosis at L5-S1.  There is diffuse edema in the paraspinal musculature.  After contrast administration there numerous metastases enhance.  IMPRESSION: 1. Diffuse metastatic disease throughout the spine with a compression fracture of L3. Infiltrated bone protrudes into the spinal canal and compresses the left lateral recess in the left neural foramen at L3-4. 2. Slight  pathologic compression fracture of the central portion of L1 without neural impingement. 3. There is no definitive epidural tumor.   Electronically Signed   By: JiRozetta Nunnery.D.   On: 08/29/2013 13:40   Ct Abdomen Pelvis W Contrast  08/29/2013   CLINICAL DATA:  Evaluate for source of malignancy.  EXAM: CT CHEST, ABDOMEN, AND PELVIS WITH CONTRAST  TECHNIQUE: Multidetector CT imaging of the chest, abdomen and pelvis was performed following the standard protocol during bolus administration of intravenous contrast.  CONTRAST:  8010mMNIPAQUE IOHEXOL 300 MG/ML  SOLN  COMPARISON:  None.  FINDINGS: CT CHEST FINDINGS  THORACIC INLET/BODY WALL:  There is asymmetric, spiculated sub areolar tissue on the right. There is  right axillary and deep pectoral lymphadenopathy with cortical thickening up to 1 cm. There is a 7 mm right supraclavicular lymph node which is moderately suspicious.  Bilateral thyroid nodules with coarse calcification, measuring up to 2 cm on the right. This is likely incidental given the patient's overall clinical status.  MEDIASTINUM:  Normal heart size. No pericardial effusion. There is residual thymus noted. Filling present within segmental arteries of the right lower lobe. No lymphadenopathy.  LUNG WINDOWS:  No definitive metastasis. Small bilateral pleural effusions with dependent atelectasis.  OSSEOUS:  See below  CT ABDOMEN AND PELVIS FINDINGS  BODY WALL: Unremarkable.  Liver: There is a 5 cm mass in segment 6 which has peripheral nodular enhancement and centripetal fill-in. There is a 5 mm and 7 mm low-density nodules within the right hepatic lobe which may enhance on delayed imaging.  Biliary: Prominent intrahepatic biliary tree without visible cause.  Pancreas: Prominent diameter of the main pancreatic duct, measuring 4 mm. There is no visible mass to cause obstruction.  Spleen: Unremarkable.  Adrenals: Unremarkable.  Kidneys and ureters: 4 mm nonobstructive stone in the interpolar left kidney.   Bladder: Focal thickening and enhancement along the right bladder base, measuring up to 7 mm in thickness. There is no excretory contrast to suggest layering hyperdense urine.  Reproductive: Hysterectomy.  Probable salpingo oophorectomies.  Bowel: Possible constipation. No bowel obstruction. No evidence of bowel inflammation.  Retroperitoneum: No mass or adenopathy.  Peritoneum: No ascites or pneumoperitoneum.  Vascular: No acute abnormality.  OSSEOUS: There is diffuse osteolytic and sclerotic bone metastases.  Notable deposits:  1. Extensive demineralization of the right pelvis, centered on the acetabulum, with extra osseous growth. No evidence of acute fracture. 2. 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 There is extra osseous tumor at the level of the left pedicle, which infiltrates the canal and left foramen. 3. L1 compression fracture with height loss approximately 25%. 4. Multiple spinal metastases likely have extra osseous tumor extension, including T5 with perispinal growth on the right. The T5 body is mildly compressed on the right, without discrete fracture line.  Critical Value/emergent results were called by telephone at the time of interpretation on 08/29/2013 at 1:24 am to [place], who verbally acknowledged these results.  IMPRESSION: 1. Segmental pulmonary embolism to the right lower lobe. 2. Findings consistent with right breast carcinoma with axillary/pectoral nodal spread and diffuse osseous metastatic disease. 3. L1 and L3 pathologic compression fractures. Retropulsion and extraosseous tumor at L3 causes advanced spinal canal and left foraminal stenosis. 4. Extensive demineralization of the right acetabulum, at significant risk for pathologic fracture. 5. 5 cm hemangioma in the right liver. Two tiny low densities in the liver which can be followed by imaging. 6. Focal urothelial thickening in the right  bladder, a possible second malignancy. 7. Borderline enlarged right supraclavicular lymph node.   Electronically Signed   By: Jorje Guild M.D.   On: 08/29/2013 01:31   Ct Biopsy  09/01/2013   CLINICAL DATA:  Extensive osseous metastatic disease, large right iliac/ acetabular destructive bone mass  EXAM: CT GUIDED CORE BIOPSY OF RIGHT ILIAC DESTRUCTIVE BONE MASS  ANESTHESIA/SEDATION: Two  Mg IV Versed; 100 mcg IV Fentanyl  Total Moderate Sedation Time: 15 minutes.  PROCEDURE: The procedure risks, benefits, and alternatives were explained to the patient. Questions regarding the procedure were encouraged and answered. The patient understands and consents to the procedure.  The right  hip region was prepped with Betadinein a sterile fashion, and a sterile drape was applied covering the operative field. A sterile gown and sterile gloves were used for the procedure. Local anesthesia was provided with 1% Lidocaine.  Previous imaging reviewed. Patient positioned supine. Noncontrast localization CT performed. The large right iliac destructive bone mass was localized. Under sterile conditions and local anesthesia, a 17 gauge 6.8 cm access needle was advanced percutaneously from a right anterior oblique approach. Needle position confirmed within the bone mass. 18 gauge core biopsies obtained. Samples placed in formalin. No immediate complication. Patient tolerated the biopsy well.  Complications: No immediate  FINDINGS: Imaging confirms needle placed in in the right iliac destructive bone mass.  IMPRESSION: Successful right iliac destructive bone mass 18 gauge core biopsy.   Electronically Signed   By: Daryll Brod M.D.   On: 09/01/2013 15:32   Dg Chest Port 1 View  08/27/2013   CLINICAL DATA:  Fever.  Recent diagnosis of cancer  EXAM: PORTABLE CHEST - 1 VIEW  COMPARISON:  None.  FINDINGS: Small streaky opacities in the lower lungs. No definitive pneumonia. No edema, effusion, or pneumothorax. There is pulmonary  hyperinflation suggesting COPD. Normal heart size and upper mediastinal contours when accounting for distortion from rightward rotation.  IMPRESSION: COPD and mild basilar atelectasis. No definitive pneumonia to explain fever.   Electronically Signed   By: Jorje Guild M.D.   On: 08/27/2013 21:49    PATHOLOGY: 09/02/2013  Bone, biopsy, Destructive rt iliac bone lesion - METASTATIC CARCINOMA. - SEE COMMENT. 1 of 2 FINAL for MARQUIA, COSTELLO ([id number]) Microscopic Comment Given the clinical findings, the features are consistent with metastatic grade II breast ductal carcinoma. A prognostic profile will be performed and the results reported separately. (JBK:gt, 09/02/13) Enid Cutter MD Pathologist, Electronic Signature (Case signed 09/02/2013)  PROGNOSTIC INDICATORS - [facility] Results: IMMUNOHISTOCHEMICAL AND MORPHOMETRIC ANALYSIS BY THE AUTOMATED CELLULAR IMAGING SYSTEM (ACIS) Estrogen Receptor: 100%, POSITIVE, STRONG STAINING INTENSITY Progesterone Receptor: 0%, NEGATIVE Proliferation Marker Ki67: 53% COMMENT: The negative hormone receptor study(ies) in this case has no internal positive control. REFERENCE RANGE ESTROGEN RECEPTOR NEGATIVE <1% POSITIVE =>1% PROGESTERONE RECEPTOR NEGATIVE <1% POSITIVE =>1% All controls stained appropriately Enid Cutter MD Pathologist, Electronic Signature ( Signed 09/05/2013)  ASSESSMENT: Kristie Jackson 58 y.o. female with a history of Breast cancer metastasized to multiple sites, unspecified laterality - Plan: CBC with Differential, Comprehensive metabolic panel (Cmet) - [facility], CA 27.29, DISCONTINUED: diphenoxylate-atropine (LOMOTIL) 2.5-0.025 MG per tablet   PLAN:   1. Metastatic Breast Cancer  complicated by L3 compression and moderate to advanced spinal stenosis -Mets to the spine, right acetabulum with cord compression, dura, axillary, supraclavicular LN, liver  --Discussed with pathology previously and consistent with above. 100% ER+ confirmed by  Dr. Lyndon Code. We will CONTINUE femara 2.5 mg daily. We discussed extensively the indications, benefits of treatment and risks including but not limited to osteoporosis, increased risks of clots, hot flashes, transaminitis. Understanding these risks and benefits and it indication as palliation of her breast cancer, she chose to proceed. Her HER-2 was positive and she is been started on Tykerb 1250 mg by mouth daily. The exception of some diarrhea she is tolerating both medications without difficulty.    --Palliative XRT per Radiation oncology.   --We will consider addition of zometa or denosumab based on extensive skeletal metastases to help reduce risk of skeletal related events.  She will require a baseline dental evaluation.   --Monitoring for therapy was discussed.  Patient will  consider having a metastatic skeletal survey for baseline.  Her C/A/P and MRI of brain and spinal columns have been obtained.   2. R lower lobe segmental PE secondary to #1 and immobilization  --Lovenox 60 mg q 12 hours.   3. ECOG 3.   4. Hypothyroidism. --Continue Levothyroxine 47 mcg daily except Monday and Thursday.   5. Bladder/ Bowel incontinence secondary to #1.   6. Pain secondary to #1.  --Continue Percocet 1-2 tabs every 6 hours as need for pain.  Continue neurotin 1,2000 mg tid.   7. 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 --Secondary to #1.  Continuing XRT to affected site.  Patient is also on dexamethasone 4 mg every 6 hours.  This will likely be tapered upon completion of XRT.   8. History of central cord injury.   9. Depression. --Continue cymbalta 60 mg daily.   10. Follow up.  --We'll followup with the patient on a monthly basis and follow her CA 25.27 in addition to a CBC differential and C. met. 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 She will likely need dental clearance prior to initiating therapy  with Zometa and/or palbociclib.   Prescription refills for her lomotil and carafate were sent to her pharmacy of record via. E. Scribe.  All questions were answered. The patient knows to call the clinic with any problems, questions or concerns. We can certainly see the patient much sooner if necessary.  Patient was reviewed with Dr. [name].  I spent 25 minutes counseling the patient face to face. The total time spent in the appointment was 35 minutes.    Carlton Adam, PA-C 10/02/2013 11:53 PM

## 2013-10-10 ENCOUNTER — Telehealth: Payer: Self-pay | Admitting: Medical Oncology

## 2013-10-10 ENCOUNTER — Inpatient Hospital Stay (HOSPITAL_COMMUNITY)
Admission: EM | Admit: 2013-10-10 | Discharge: 2013-10-15 | DRG: 371 | Disposition: A | Discharge status: Home / Self Care | Payer: Medicare Other | Attending: Internal Medicine | Admitting: Internal Medicine

## 2013-10-10 ENCOUNTER — Encounter (HOSPITAL_COMMUNITY): Payer: Self-pay | Admitting: Emergency Medicine

## 2013-10-10 ENCOUNTER — Encounter: Payer: Self-pay | Admitting: Medical Oncology

## 2013-10-10 DIAGNOSIS — G9340 Encephalopathy, unspecified: Secondary | ICD-10-CM

## 2013-10-10 DIAGNOSIS — F17200 Nicotine dependence, unspecified, uncomplicated: Secondary | ICD-10-CM | POA: Diagnosis present

## 2013-10-10 DIAGNOSIS — Z86711 Personal history of pulmonary embolism: Secondary | ICD-10-CM

## 2013-10-10 DIAGNOSIS — C7952 Secondary malignant neoplasm of bone marrow: Secondary | ICD-10-CM | POA: Diagnosis present

## 2013-10-10 DIAGNOSIS — G8389 Other specified paralytic syndromes: Secondary | ICD-10-CM

## 2013-10-10 DIAGNOSIS — Z79899 Other long term (current) drug therapy: Secondary | ICD-10-CM | POA: Diagnosis not present

## 2013-10-10 DIAGNOSIS — G43909 Migraine, unspecified, not intractable, without status migrainosus: Secondary | ICD-10-CM | POA: Diagnosis present

## 2013-10-10 DIAGNOSIS — Z681 Body mass index (BMI) 19 or less, adult: Secondary | ICD-10-CM

## 2013-10-10 DIAGNOSIS — L8991 Pressure ulcer of unspecified site, stage 1: Secondary | ICD-10-CM | POA: Diagnosis present

## 2013-10-10 DIAGNOSIS — C50919 Malignant neoplasm of unspecified site of unspecified female breast: Secondary | ICD-10-CM | POA: Diagnosis present

## 2013-10-10 DIAGNOSIS — C800 Disseminated malignant neoplasm, unspecified: Secondary | ICD-10-CM

## 2013-10-10 DIAGNOSIS — E860 Dehydration: Secondary | ICD-10-CM | POA: Diagnosis present

## 2013-10-10 DIAGNOSIS — F1910 Other psychoactive substance abuse, uncomplicated: Secondary | ICD-10-CM

## 2013-10-10 DIAGNOSIS — A0472 Enterocolitis due to Clostridium difficile, not specified as recurrent: Principal | ICD-10-CM

## 2013-10-10 DIAGNOSIS — E785 Hyperlipidemia, unspecified: Secondary | ICD-10-CM | POA: Diagnosis present

## 2013-10-10 DIAGNOSIS — G43001 Migraine without aura, not intractable, with status migrainosus: Secondary | ICD-10-CM

## 2013-10-10 DIAGNOSIS — E43 Unspecified severe protein-calorie malnutrition: Secondary | ICD-10-CM | POA: Diagnosis present

## 2013-10-10 DIAGNOSIS — R197 Diarrhea, unspecified: Secondary | ICD-10-CM

## 2013-10-10 DIAGNOSIS — E039 Hypothyroidism, unspecified: Secondary | ICD-10-CM | POA: Diagnosis present

## 2013-10-10 DIAGNOSIS — C7951 Secondary malignant neoplasm of bone: Secondary | ICD-10-CM | POA: Diagnosis present

## 2013-10-10 DIAGNOSIS — C799 Secondary malignant neoplasm of unspecified site: Secondary | ICD-10-CM

## 2013-10-10 DIAGNOSIS — Z170 Estrogen receptor positive status [ER+]: Secondary | ICD-10-CM

## 2013-10-10 DIAGNOSIS — I2699 Other pulmonary embolism without acute cor pulmonale: Secondary | ICD-10-CM

## 2013-10-10 DIAGNOSIS — G8250 Quadriplegia, unspecified: Secondary | ICD-10-CM | POA: Diagnosis present

## 2013-10-10 DIAGNOSIS — R627 Adult failure to thrive: Secondary | ICD-10-CM

## 2013-10-10 DIAGNOSIS — L89309 Pressure ulcer of unspecified buttock, unspecified stage: Secondary | ICD-10-CM | POA: Diagnosis present

## 2013-10-10 DIAGNOSIS — E876 Hypokalemia: Secondary | ICD-10-CM

## 2013-10-10 DIAGNOSIS — R634 Abnormal weight loss: Secondary | ICD-10-CM

## 2013-10-10 DIAGNOSIS — L89109 Pressure ulcer of unspecified part of back, unspecified stage: Secondary | ICD-10-CM | POA: Diagnosis present

## 2013-10-10 DIAGNOSIS — A419 Sepsis, unspecified organism: Secondary | ICD-10-CM

## 2013-10-10 DIAGNOSIS — N390 Urinary tract infection, site not specified: Secondary | ICD-10-CM | POA: Diagnosis present

## 2013-10-10 HISTORY — DX: Nausea with vomiting, unspecified: R11.2

## 2013-10-10 HISTORY — DX: Other specified postprocedural states: Z98.890

## 2013-10-10 HISTORY — DX: Other pulmonary embolism without acute cor pulmonale: I26.99

## 2013-10-10 LAB — URINALYSIS, ROUTINE W REFLEX MICROSCOPIC
Glucose, UA: NEGATIVE mg/dL
Hgb urine dipstick: NEGATIVE
Ketones, ur: NEGATIVE mg/dL
Leukocytes, UA: NEGATIVE
Nitrite: NEGATIVE
Protein, ur: NEGATIVE mg/dL
Specific Gravity, Urine: 1.02 (ref 1.005–1.030)
UROBILINOGEN UA: 0.2 mg/dL (ref 0.0–1.0)
pH: 5.5 (ref 5.0–8.0)

## 2013-10-10 LAB — COMPREHENSIVE METABOLIC PANEL
ALT: 25 U/L (ref 0–35)
ANION GAP: 14 (ref 5–15)
AST: 26 U/L (ref 0–37)
Albumin: 2.8 g/dL — ABNORMAL LOW (ref 3.5–5.2)
Alkaline Phosphatase: 100 U/L (ref 39–117)
BUN: 11 mg/dL (ref 6–23)
CALCIUM: 9.8 mg/dL (ref 8.4–10.5)
CO2: 24 meq/L (ref 19–32)
Chloride: 103 mEq/L (ref 96–112)
Creatinine, Ser: 0.28 mg/dL — ABNORMAL LOW (ref 0.50–1.10)
GFR calc Af Amer: >90 mL/min (ref >90)
GFR calc non Af Amer: >90 mL/min (ref >90)
Glucose, Bld: 86 mg/dL (ref 70–99)
Potassium: 3.6 mEq/L — ABNORMAL LOW (ref 3.7–5.3)
Sodium: 141 mEq/L (ref 137–147)
TOTAL PROTEIN: 6.2 g/dL (ref 6.0–8.3)
Total Bilirubin: <0.2 mg/dL — ABNORMAL LOW (ref 0.3–1.2)

## 2013-10-10 LAB — CBC
HEMATOCRIT: 40 % (ref 36.0–46.0)
HEMOGLOBIN: 12.8 g/dL (ref 12.0–15.0)
MCH: 31.1 pg (ref 26.0–34.0)
MCHC: 32 g/dL (ref 30.0–36.0)
MCV: 97.3 fL (ref 78.0–100.0)
Platelets: 222 10*3/uL (ref 150–400)
RBC: 4.11 MIL/uL (ref 3.87–5.11)
RDW: 19.6 % — ABNORMAL HIGH (ref 11.5–15.5)
WBC: 3.9 10*3/uL — ABNORMAL LOW (ref 4.0–10.5)

## 2013-10-10 MED ORDER — SUCRALFATE 1 G PO TABS
1.0000 g | ORAL_TABLET | Freq: Three times a day (TID) | ORAL | Status: DC
  Administered 2013-10-11 – 2013-10-15 (×18): 1 g via ORAL
  Filled 2013-10-10 (×22): qty 1

## 2013-10-10 MED ORDER — LEVOTHYROXINE SODIUM 75 MCG PO TABS: 75.0000 ug | ORAL_TABLET | ORAL | Status: DC

## 2013-10-10 MED ORDER — SODIUM CHLORIDE 0.9 % IV SOLN: INTRAVENOUS | Status: AC

## 2013-10-10 MED ORDER — TIZANIDINE HCL 4 MG PO TABS
8.0000 mg | ORAL_TABLET | Freq: Three times a day (TID) | ORAL | Status: DC
  Administered 2013-10-11 – 2013-10-15 (×14): 8 mg via ORAL
  Filled 2013-10-10 (×16): qty 2

## 2013-10-10 MED ORDER — DULOXETINE HCL 60 MG PO CPEP
60.0000 mg | ORAL_CAPSULE | Freq: Every day | ORAL | Status: DC
  Administered 2013-10-11 – 2013-10-14 (×5): 60 mg via ORAL
  Filled 2013-10-10 (×6): qty 1

## 2013-10-10 MED ORDER — ONDANSETRON HCL 4 MG PO TABS
4.0000 mg | ORAL_TABLET | Freq: Four times a day (QID) | ORAL | Status: DC | PRN
  Administered 2013-10-15: 4 mg via ORAL
  Filled 2013-10-10: qty 1

## 2013-10-10 MED ORDER — DIAZEPAM 5 MG PO TABS
10.0000 mg | ORAL_TABLET | Freq: Four times a day (QID) | ORAL | Status: DC | PRN
  Administered 2013-10-11 – 2013-10-15 (×9): 10 mg via ORAL
  Filled 2013-10-10 (×9): qty 2

## 2013-10-10 MED ORDER — LEVOTHYROXINE SODIUM 75 MCG PO TABS
75.0000 ug | ORAL_TABLET | ORAL | Status: DC
  Administered 2013-10-11 – 2013-10-15 (×4): 75 ug via ORAL
  Filled 2013-10-10 (×4): qty 1

## 2013-10-10 MED ORDER — SIMVASTATIN 40 MG PO TABS: 40.0000 mg | ORAL_TABLET | Freq: Every day | ORAL | Status: DC

## 2013-10-10 MED ORDER — GABAPENTIN 600 MG PO TABS: 1200.0000 mg | ORAL_TABLET | Freq: Three times a day (TID) | ORAL | Status: DC

## 2013-10-10 MED ORDER — HYDROMORPHONE HCL PF 1 MG/ML IJ SOLN
0.5000 mg | Freq: Once | INTRAMUSCULAR | Status: AC
  Administered 2013-10-10: 0.5 mg via INTRAVENOUS
  Filled 2013-10-10: qty 1

## 2013-10-10 MED ORDER — SUMATRIPTAN SUCCINATE 100 MG PO TABS
100.0000 mg | ORAL_TABLET | ORAL | Status: DC | PRN
  Filled 2013-10-10: qty 1

## 2013-10-10 MED ORDER — ENOXAPARIN SODIUM 60 MG/0.6ML ~~LOC~~ SOLN
60.0000 mg | Freq: Two times a day (BID) | SUBCUTANEOUS | Status: DC
  Administered 2013-10-11 – 2013-10-15 (×10): 60 mg via SUBCUTANEOUS
  Filled 2013-10-10 (×11): qty 0.6

## 2013-10-10 MED ORDER — PANTOPRAZOLE SODIUM 40 MG PO TBEC
40.0000 mg | DELAYED_RELEASE_TABLET | Freq: Every day | ORAL | Status: DC
  Administered 2013-10-11 – 2013-10-15 (×5): 40 mg via ORAL
  Filled 2013-10-10 (×5): qty 1

## 2013-10-10 MED ORDER — TOPIRAMATE 100 MG PO TABS
100.0000 mg | ORAL_TABLET | Freq: Every day | ORAL | Status: DC
  Administered 2013-10-11 – 2013-10-14 (×5): 100 mg via ORAL
  Filled 2013-10-10 (×6): qty 1

## 2013-10-10 MED ORDER — OXYCODONE HCL 5 MG PO TABS
5.0000 mg | ORAL_TABLET | ORAL | Status: DC | PRN
Start: 2013-10-10 — End: 2013-10-15
  Administered 2013-10-11 – 2013-10-15 (×12): 5 mg via ORAL
  Filled 2013-10-10 (×13): qty 1

## 2013-10-10 MED ORDER — SODIUM CHLORIDE 0.9 % IV SOLN
INTRAVENOUS | Status: DC
  Administered 2013-10-10 – 2013-10-13 (×6): via INTRAVENOUS
  Administered 2013-10-13: 1000 mL via INTRAVENOUS
  Administered 2013-10-14 (×2): via INTRAVENOUS

## 2013-10-10 MED ORDER — SODIUM CHLORIDE 0.9 % IV BOLUS (SEPSIS)
1000.0000 mL | Freq: Once | INTRAVENOUS | Status: AC
  Administered 2013-10-10: 1000 mL via INTRAVENOUS

## 2013-10-10 MED ORDER — POTASSIUM CHLORIDE CRYS ER 20 MEQ PO TBCR
40.0000 meq | EXTENDED_RELEASE_TABLET | Freq: Once | ORAL | Status: AC
  Administered 2013-10-10: 40 meq via ORAL
  Filled 2013-10-10: qty 2

## 2013-10-10 MED ORDER — ACETAMINOPHEN 650 MG RE SUPP: 650.0000 mg | Freq: Four times a day (QID) | RECTAL | Status: DC | PRN

## 2013-10-10 MED ORDER — ENSURE PUDDING PO PUDG
1.0000 | Freq: Three times a day (TID) | ORAL | Status: DC
  Administered 2013-10-11 – 2013-10-14 (×6): 1 via ORAL
  Filled 2013-10-10 (×15): qty 1

## 2013-10-10 MED ORDER — DEXAMETHASONE 4 MG PO TABS: 4.0000 mg | ORAL_TABLET | Freq: Four times a day (QID) | ORAL | Status: DC

## 2013-10-10 MED ORDER — HYDROMORPHONE HCL PF 1 MG/ML IJ SOLN
0.5000 mg | INTRAMUSCULAR | Status: DC | PRN
  Administered 2013-10-11 – 2013-10-14 (×5): 1 mg via INTRAVENOUS
  Filled 2013-10-10 (×5): qty 1

## 2013-10-10 MED ORDER — ONDANSETRON HCL 4 MG/2ML IJ SOLN
4.0000 mg | Freq: Four times a day (QID) | INTRAMUSCULAR | Status: DC | PRN
  Administered 2013-10-12 – 2013-10-14 (×5): 4 mg via INTRAVENOUS
  Filled 2013-10-10 (×5): qty 2

## 2013-10-10 MED ORDER — ACETAMINOPHEN 325 MG PO TABS: 650.0000 mg | ORAL_TABLET | Freq: Four times a day (QID) | ORAL | Status: DC | PRN

## 2013-10-10 NOTE — Progress Notes (Signed)
Pt's sister called stating she is concerned about pt. She states she has had diarrhea and not been eating well. I told her I would call the pt to discuss. I called the pt and her daughter states she is asleep. I asked her to have mom to call me and she voiced she would.

## 2013-10-10 NOTE — ED Provider Notes (Signed)
CSN: [id number]     Arrival date & time 10/10/13  1835 History   First MD Initiated Contact with Patient 10/10/13 1844     Chief Complaint  Patient presents with  . Diarrhea     (Consider location/radiation/quality/duration/timing/severity/associated sxs/prior Treatment) Patient is a 58 y.o. female presenting with diarrhea. The history is provided by the patient.  Diarrhea Associated symptoms: no abdominal pain, no chills, no fever, no headaches and no vomiting   pt with hx metastatic breast ca, c/o diarrhea for the past few weeks, persistent, episodic, w multiple episodes per day.  No acute or abrupt change today.  Has been on tykerb and femara in the past couple weeks - states those being her only new meds.  States had mild diarrhea before starting those meds however worse since. Was last on abx during hospitalization approximately 5 weeks ago. No known ill contacts w diarrhea type illness. No vomiting. No fever or chills. No abd pain. No dysuria or gu c/o. Is drinking fluids. Pt reports excoriation anal area due to persistent diarrhea despite maximal dose outpatient antidiarrheal med/lomotil.  Pt also reports 20 lbs wt loss in past couple weeks.       Past Medical History  Diagnosis Date  . Central hypothyroidism   . Hyperlipidemia   . Spinal cord injury   . Depression   . Cancer     spinal metastasis   Past Surgical History  Procedure Laterality Date  . Laminectomy    . Abdominal hysterectomy     No family history on file. History  Substance Use Topics  . Smoking status: Current Every Day Smoker -- 1.00 packs/day    Types: Cigarettes  . Smokeless tobacco: Not on file  . Alcohol Use: Yes   OB History   Grav Para Term Preterm Abortions TAB SAB Ect Mult Living                 Review of Systems  Constitutional: Positive for unexpected weight change. Negative for fever and chills.  HENT: Negative for sore throat.   Eyes: Negative for redness.  Respiratory: Negative  for shortness of breath.   Cardiovascular: Negative for chest pain.  Gastrointestinal: Positive for diarrhea. Negative for vomiting and abdominal pain.  Genitourinary: Negative for dysuria and flank pain.  Musculoskeletal: Negative for back pain and neck pain.  Skin: Negative for rash.  Neurological: Negative for headaches.  Hematological: Does not bruise/bleed easily.  Psychiatric/Behavioral: Negative for confusion.      Allergies  Morphine and related  Home Medications   Prior to Admission medications   Medication Sig Start Date End Date Taking? Authorizing Provider  acetaminophen (TYLENOL) 500 MG tablet Take 1,000 mg by mouth every 6 (six) hours as needed for moderate pain.    Historical Provider, MD  dexamethasone (DECADRON) 4 MG tablet Take 1 tablet (4 mg total) by mouth 4 (four) times daily. 09/05/13   [name], MD  diazepam (VALIUM) 10 MG tablet Take 10 mg by mouth every 6 (six) hours as needed. For muscle spasms 07/10/13   Elwyn Lade, PA-C  diphenoxylate-atropine (LOMOTIL) 2.5-0.025 MG per tablet TAKE 1 TO 2 TABLETS BY MOUTH FOUR TIMES DAILY AS NEEDED FOR DIARRHEA OR LOOSE STOOLS 09/28/13   Lora Paula, MD  diphenoxylate-atropine (LOMOTIL) 2.5-0.025 MG per tablet Take 1-2 tablets by mouth 4 (four) times daily as needed for diarrhea or loose stools. 09/26/13   Carlton Adam, PA-C  DULoxetine (CYMBALTA) 60 MG capsule Take 60  mg by mouth at bedtime.    Historical Provider, MD  enoxaparin (LOVENOX) 60 MG/0.6ML injection Inject 0.6 mLs (60 mg total) into the skin every 12 (twelve) hours. 09/05/13   [name], MD  esomeprazole (NEXIUM) 20 MG capsule Take 20 mg by mouth daily at 12 noon.    Historical Provider, MD  gabapentin (NEURONTIN) 600 MG tablet Take 1,200 mg by mouth 3 (three) times daily.    Historical Provider, MD  lapatinib (TYKERB) 250 MG tablet Take 5 tablets (1,250 mg total) by mouth daily. 09/15/13   Concha Norway, MD  letrozole [facility]) 2.5 MG  tablet Take 1 tablet (2.5 mg total) by mouth daily. 09/05/13   Concha Norway, MD  levothyroxine (SYNTHROID, LEVOTHROID) 75 MCG tablet Take 75 mcg by mouth See admin instructions. Takes daily except Monday and Thursday    Historical Provider, MD  oxyCODONE-acetaminophen (PERCOCET) 10-325 MG per tablet Take 1 tablet by mouth every 6 (six) hours as needed for pain.    Historical Provider, MD  polyethylene glycol (MIRALAX / GLYCOLAX) packet Take 17 g by mouth daily as needed for mild constipation, moderate constipation or severe constipation. 09/05/13   [name], MD  rizatriptan (MAXALT) 10 MG tablet Take 10 mg by mouth as needed for migraine. May repeat in 2 hours if needed    Historical Provider, MD  simvastatin (ZOCOR) 40 MG tablet Take 40 mg by mouth daily.    Historical Provider, MD  sucralfate (CARAFATE) 1 G tablet Take 1 g by mouth 4 (four) times daily -  with meals and at bedtime.    Historical Provider, MD  tiZANidine (ZANAFLEX) 4 MG tablet Take 8 mg by mouth 3 (three) times daily.    Historical Provider, MD  topiramate (TOPAMAX) 100 MG tablet Take 100 mg by mouth at bedtime.    Historical Provider, MD   BP 99/69  Pulse 101  Temp(Src) 98.1 F (36.7 C) (Oral)  Resp 16  SpO2 98% Physical Exam  Nursing note and vitals reviewed. Constitutional: She is oriented to person, place, and time. No distress.  Thin, cachectic appearing.   HENT:  Mouth/Throat: Oropharynx is clear and moist.  Eyes: Conjunctivae are normal. No scleral icterus.  Neck: Neck supple. No tracheal deviation present.  Cardiovascular: Normal rate, regular rhythm, normal heart sounds and intact distal pulses.   Pulmonary/Chest: Effort normal and breath sounds normal. No respiratory distress.  Abdominal: Soft. Normal appearance and bowel sounds are normal. She exhibits no distension and no mass. There is no tenderness. There is no rebound and no guarding.  Genitourinary:  No cva tenderness  Musculoskeletal: She  exhibits no edema and no tenderness.  Neurological: She is alert and oriented to person, place, and time.  Skin: Skin is warm and dry. No rash noted.  Psychiatric: She has a normal mood and affect.    ED Course  Procedures (including critical care time) Labs Review  Results for orders placed during the hospital encounter of 10/10/13  CBC      Result Value Ref Range   WBC 3.9 (*) 4.0 - 10.5 K/uL   RBC 4.11  3.87 - 5.11 MIL/uL   Hemoglobin 12.8  12.0 - 15.0 g/dL   HCT 40.0  36.0 - 46.0 %   MCV 97.3  78.0 - 100.0 fL   MCH 31.1  26.0 - 34.0 pg   MCHC 32.0  30.0 - 36.0 g/dL   RDW 19.6 (*) 11.5 - 15.5 %   Platelets 222  150 - 400 K/uL  COMPREHENSIVE METABOLIC PANEL      Result Value Ref Range   Sodium 141  137 - 147 mEq/L   Potassium 3.6 (*) 3.7 - 5.3 mEq/L   Chloride 103  96 - 112 mEq/L   CO2 24  19 - 32 mEq/L   Glucose, Bld 86  70 - 99 mg/dL   BUN 11  6 - 23 mg/dL   Creatinine, Ser 0.28 (*) 0.50 - 1.10 mg/dL   Calcium 9.8  8.4 - 10.5 mg/dL   Total Protein 6.2  6.0 - 8.3 g/dL   Albumin 2.8 (*) 3.5 - 5.2 g/dL   AST 26  0 - 37 U/L   ALT 25  0 - 35 U/L   Alkaline Phosphatase 100  39 - 117 U/L   Total Bilirubin <0.2 (*) 0.3 - 1.2 mg/dL   GFR calc non Af Amer >90  >90 mL/min   GFR calc Af Amer >90  >90 mL/min   Anion gap 14  5 - 15  URINALYSIS, ROUTINE W REFLEX MICROSCOPIC      Result Value Ref Range   Color, Urine AMBER (*) YELLOW   APPearance CLOUDY (*) CLEAR   Specific Gravity, Urine 1.020  1.005 - 1.030   pH 5.5  5.0 - 8.0   Glucose, UA NEGATIVE  NEGATIVE mg/dL   Hgb urine dipstick NEGATIVE  NEGATIVE   Bilirubin Urine SMALL (*) NEGATIVE   Ketones, ur NEGATIVE  NEGATIVE mg/dL   Protein, ur NEGATIVE  NEGATIVE mg/dL   Urobilinogen, UA 0.2  0.0 - 1.0 mg/dL   Nitrite NEGATIVE  NEGATIVE   Leukocytes, UA NEGATIVE  NEGATIVE      MDM  Iv ns bolus. Labs.  Reviewed nursing notes and prior charts for additional history.   Labs noted, k mildly low, kcl po.  Additional  ivf.  Discussed pt with heme/onc on call, Dr Learta Codding, states given persistent diarrhea despite outpt meds, wt loss/ftt, would admit to hospitalist, agrees w checking c diff and stopping tykerb, supportive rx, he will see in AM.  Discussed w [facility] hospitalist.   Mirna Mires, MD 10/10/13 2049

## 2013-10-10 NOTE — H&P (Signed)
[facility] Admission History and Physical       Kristie Jackson RKY:[id number] DOB: Dec 19, 1955 DOA: 10/10/2013  Referring physician:  EDP PCP: [name], MD  Specialists: Dr. Benay Spice (Oncology)  Chief Complaint: Diarrhea  HPI: Kristie Jackson is a 58 y.o. 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   She reports having 7-8 episodes of diarrhea daily for the past 5 days and has been taking Lomotil with minimal relief.    She has had a decreased appetite and reports a weight loss of 20 pounds over the past 2 weeks.    She was hospitalized in     for   And was found to have a pulmonary embolism and was placed on full dose Lovenox Rx.     Oncology Dr Benay Spice was contacted by the EDP and made recommendations for patient to be evaluated for possible C. Diff, and to hold on her oral chemotherapy Rx as it may be a cause of her diarrhea as well and he will see her in the AM.      Review of Systems:  Constitutional: + Weight Loss, No Weight Gain, Night Sweats, Fevers, Chills, Dizziness, Fatigue, or +Generalized Weakness HEENT: No Headaches, Difficulty Swallowing,Tooth/Dental Problems,Sore Throat,  No Sneezing, Rhinitis, Ear Ache, Nasal Congestion, or Post Nasal Drip,  Cardio-vascular:  No Chest pain, Orthopnea, PND, Edema in Lower Extremities, Anasarca, Dizziness, Palpitations  Resp: No Dyspnea, No DOE, No Cough, No Hemoptysis, No Wheezing.    GI: No Heartburn, Indigestion, Abdominal Pain, Nausea, Vomiting, +Diarrhea, Hematemesis, Hematochezia, Melena, Change in Bowel Habits,  +Loss of Appetite  GU: No Dysuria, Change in Color of Urine, No Urgency or Frequency, No  Flank pain.  Musculoskeletal: No Joint Pain or Swelling, No Decreased Range of Motion, No Back Pain.  Neurologic: No Syncope, No Seizures, +Muscle Weakness, Paresthesia, Vision Disturbance or Loss, No Diplopia, No Vertigo,+Difficulty Walking,  Skin: No Rash or Lesions. Psych: No Change in Mood or Affect, No Depression or Anxiety, No Memory loss, No Confusion, or Hallucinations   Past Medical History  Diagnosis Date  . Central hypothyroidism   . Hyperlipidemia   . Spinal cord injury   . Depression   . Cancer     spinal metastasis  . PONV (postoperative nausea and vomiting)      Past Surgical History  Procedure Laterality Date  . Laminectomy    . Abdominal hysterectomy        Prior to Admission medications   Medication Sig Start Date End Date Taking? Authorizing Provider  acetaminophen (TYLENOL) 500 MG tablet Take 1,000 mg by mouth every 6 (six) hours as needed for moderate pain.    Historical Provider, MD  dexamethasone (DECADRON) 4 MG tablet Take 1 tablet (4 mg total) by mouth 4 (four) times daily. 09/05/13   [name], MD  diazepam (VALIUM) 10 MG tablet Take 10 mg by mouth every 6 (six) hours as needed. For muscle spasms 07/10/13   Elwyn Lade, PA-C  diphenoxylate-atropine (LOMOTIL) 2.5-0.025 MG per tablet TAKE 1 TO 2 TABLETS BY MOUTH FOUR TIMES DAILY AS NEEDED FOR DIARRHEA OR LOOSE STOOLS 09/28/13   Lora Paula, MD  diphenoxylate-atropine (LOMOTIL) 2.5-0.025 MG per tablet Take 1-2 tablets by mouth 4 (four) times daily as needed for diarrhea or loose stools. 09/26/13   Carlton Adam, PA-C  DULoxetine (CYMBALTA) 60 MG capsule Take 60 mg by mouth at bedtime.    Historical Provider, MD  enoxaparin (LOVENOX) 60 MG/0.6ML injection Inject 0.6 mLs (60 mg total) into the skin every 12 (twelve) hours. 09/05/13   [name], MD  esomeprazole (NEXIUM) 20 MG capsule Take 20 mg by mouth daily at 12 noon.    Historical Provider, MD  gabapentin (NEURONTIN) 600 MG  tablet Take 1,200 mg by mouth 3 (three) times daily.    Historical Provider, MD  lapatinib (TYKERB) 250 MG tablet Take 5 tablets (1,250 mg total) by mouth daily. 09/15/13   Concha Norway, MD  letrozole [facility]) 2.5 MG tablet Take 1 tablet (2.5 mg total) by mouth daily. 09/05/13   Concha Norway, MD  levothyroxine (SYNTHROID, LEVOTHROID) 75 MCG tablet Take 75 mcg by mouth See admin instructions. Takes daily except Monday and Thursday    Historical Provider, MD  oxyCODONE-acetaminophen (PERCOCET) 10-325 MG per tablet Take 1 tablet by mouth every 6 (six) hours as needed for pain.    Historical Provider, MD  polyethylene glycol (MIRALAX / GLYCOLAX) packet Take 17 g by mouth daily as needed for mild constipation, moderate constipation or severe constipation. 09/05/13   [name], MD  rizatriptan (MAXALT) 10 MG tablet Take 10 mg by mouth as needed for migraine. May repeat in 2 hours if needed    Historical Provider, MD  simvastatin (ZOCOR) 40 MG tablet Take 40 mg by mouth daily.    Historical Provider, MD  sucralfate (CARAFATE) 1 G tablet Take 1 g by mouth 4 (four) times daily -  with meals and at bedtime.    Historical Provider, MD  tiZANidine (ZANAFLEX) 4 MG tablet Take 8 mg by mouth 3 (three) times daily.    Historical Provider, MD  topiramate (TOPAMAX) 100 MG tablet Take 100 mg by mouth at bedtime.    Historical Provider, MD      Allergies  Allergen Reactions  . Morphine And Related Nausea And Vomiting     Social History:  Living a Bed to Wheelchair existence   reports that she has been smoking Cigarettes.  She has a 40 pack-year smoking history. She has never used smokeless tobacco. She reports that she does not drink alcohol or use illicit drugs.     History reviewed. No pertinent family history.     Physical Exam:  GEN:  Pleasant Thin Ill Appearing  58 y.o. Caucasian female examined and in no acute distress; cooperative with exam Filed Vitals:   10/10/13 1835 10/10/13 1841 10/10/13  2147  BP:  99/69 107/73  Pulse:  101 90  Temp:  98.1 F (36.7 C)   TempSrc:  Oral   Resp:  16 12  SpO2: 99% 98% 99%   Blood pressure 107/73, pulse 90, temperature 98.1 F (36.7 C), temperature source Oral, resp. rate 12, SpO2 99.00%. PSYCH: She is alert and oriented x4; does not appear anxious does not appear depressed; affect is normal HEENT: Normocephalic and Atraumatic, Mucous membranes pink; PERRLA; EOM intact; Fundi:  Benign;  No scleral icterus, Nares: Patent, Oropharynx: Clear, Fair Dentition,    Neck:  FROM, No Cervical Lymphadenopathy nor Thyromegaly or Carotid Bruit; No JVD; Breasts:: Not examined CHEST WALL: No tenderness CHEST: Normal respiration, clear to auscultation bilaterally HEART: Regular rate and rhythm; no murmurs  rubs or gallops BACK: No kyphosis or scoliosis; No CVA tenderness ABDOMEN: Positive Bowel Sounds, Scaphoid, Soft Non-Tender; No Masses, No Organomegaly. Rectal Exam: Not done EXTREMITIES: No Cyanosis, Clubbing, or Edema; No Ulcerations. Genitalia: not examined PULSES: 2+ and symmetric SKIN: Normal hydration no rash or ulceration CNS:  Alert and Oriented x 4, No Focal Deficits, Generalized Weakness Vascular: pulses palpable throughout    Labs on Admission:  Basic Metabolic Panel:  Recent Labs Lab 10/10/13 1901  NA 141  K 3.6*  CL 103  CO2 24  GLUCOSE 86  BUN 11  CREATININE 0.28*  CALCIUM 9.8   Liver Function Tests:  Recent Labs Lab 10/10/13 1901  AST 26  ALT 25  ALKPHOS 100  BILITOT <0.2*  PROT 6.2  ALBUMIN 2.8*   No results found for this basename: LIPASE, AMYLASE,  in the last 168 hours No results found for this basename: AMMONIA,  in the last 168 hours CBC:  Recent Labs Lab 10/10/13 1901  WBC 3.9*  HGB 12.8  HCT 40.0  MCV 97.3  PLT 222   Cardiac Enzymes: No results found for this basename: CKTOTAL, CKMB, CKMBINDEX, TROPONINI,  in the last 168 hours  BNP (last 3 results) No results found for this basename:  PROBNP,  in the last 8760 hours CBG: No results found for this basename: GLUCAP,  in the last 168 hours  Radiological Exams on Admission: No results found.    Assessment/Plan:   58 y.o. female with   Principal Problem:   1.    Diarrhea- due to Infectious Etilogy or due to her Chemo Rx   Enteric Precautions   Stool sent for C.Diff PCR and C+S `  IVFs     Active Problems:   2.    Protein-calorie malnutrition, severe /Weight loss/ Adult Failure to Thrive   Check Pre-Albumin   Clear Liquid Diet , advance as Tolerated   Add Liquid Nutritional Supplements   Nutrition Consult     3.    Breast cancer metastasized to multiple sites   Oncology Dr Carlye Grippe by EDP, to see in AM   Per Dr Benay Spice to hold her Oral daily Chemo Rx for now       4.    Migraine headache   PRN IV Dilaudid   PRN Imitrex or Axert Rx.        5.    HX of PE   On Full dose Lovenox Rx     6.  Stage I Decubitii on Mid Back  and [place] for continued Care       7.  DVT Prophylaxis   On Full dose Lovenox Rx for PE      Code Status:   FULL CODE Family Communication:   Daughter at Bedside  Disposition Plan:   INpatient      Time spent:  [place] [facility] Pager [phone]   If [employer] Please Contact the Day Rounding Team MD for [facility]  If 7PM-7AM, Please Contact night-coverage  [email or web address] Password [facility] 10/10/2013, 10:29 PM

## 2013-10-10 NOTE — ED Notes (Addendum)
Pt from home via EMS-Per EMS pt c/o diarrhea since starting a new chemo med 3 weeks ago. Pt states that she is hungry but when she eats, it "goes right through her." Pt reports drinking water w/o difficulty. Pt is being tx for brest CA. Pt stopped taking her chemo meds 2 days ago. Pt is A&O and in NAD

## 2013-10-10 NOTE — ED Notes (Signed)
Bed: WA24 Expected date:  Expected time:  Means of arrival:  Comments: EMS-diarrhea  

## 2013-10-11 DIAGNOSIS — A0472 Enterocolitis due to Clostridium difficile, not specified as recurrent: Principal | ICD-10-CM

## 2013-10-11 DIAGNOSIS — C7952 Secondary malignant neoplasm of bone marrow: Secondary | ICD-10-CM

## 2013-10-11 DIAGNOSIS — C7951 Secondary malignant neoplasm of bone: Secondary | ICD-10-CM

## 2013-10-11 DIAGNOSIS — I2699 Other pulmonary embolism without acute cor pulmonale: Secondary | ICD-10-CM

## 2013-10-11 DIAGNOSIS — E46 Unspecified protein-calorie malnutrition: Secondary | ICD-10-CM

## 2013-10-11 LAB — BASIC METABOLIC PANEL
Anion gap: 11 (ref 5–15)
BUN: 8 mg/dL (ref 6–23)
CO2: 22 mEq/L (ref 19–32)
Calcium: 9.3 mg/dL (ref 8.4–10.5)
Chloride: 109 mEq/L (ref 96–112)
Creatinine, Ser: 0.28 mg/dL — ABNORMAL LOW (ref 0.50–1.10)
Glucose, Bld: 87 mg/dL (ref 70–99)
Potassium: 3.9 mEq/L (ref 3.7–5.3)
SODIUM: 142 meq/L (ref 137–147)

## 2013-10-11 LAB — CBC
HCT: 36.5 % (ref 36.0–46.0)
Hemoglobin: 11.5 g/dL — ABNORMAL LOW (ref 12.0–15.0)
MCH: 31.3 pg (ref 26.0–34.0)
MCHC: 31.5 g/dL (ref 30.0–36.0)
MCV: 99.2 fL (ref 78.0–100.0)
PLATELETS: 191 10*3/uL (ref 150–400)
RBC: 3.68 MIL/uL — ABNORMAL LOW (ref 3.87–5.11)
RDW: 19.7 % — AB (ref 11.5–15.5)
WBC: 4 10*3/uL (ref 4.0–10.5)

## 2013-10-11 LAB — PREALBUMIN: PREALBUMIN: 22.6 mg/dL (ref 17.0–34.0)

## 2013-10-11 LAB — CLOSTRIDIUM DIFFICILE BY PCR: CDIFFPCR: POSITIVE — AB

## 2013-10-11 MED ORDER — METRONIDAZOLE IN NACL 5-0.79 MG/ML-% IV SOLN
500.0000 mg | Freq: Three times a day (TID) | INTRAVENOUS | Status: DC
  Administered 2013-10-11 – 2013-10-15 (×12): 500 mg via INTRAVENOUS
  Filled 2013-10-11 (×13): qty 100

## 2013-10-11 NOTE — Progress Notes (Signed)
IP PROGRESS NOTE  Subjective:   She was diagnosed with metastatic breast cancer in July of 2015. She started Femara 09/05/2013 and completed palliative radiation to the thoracic and lumbosacral spine, and right hip on 09/19/2013. She reports significant improvement in back pain since completing radiation.  The tumor returned HER-2 positive and she started lapatanib 09/12/2013. She reports the onset of diarrhea after starting lapatinib. She has diarrhea several times per day. She is developing soreness at the perineum secondary to diarrhea. She has a good appetite but has not been eating secondary to diarrhea. No bleeding.  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  Objective: Vital signs in last 24 hours: Blood pressure 116/78, pulse 100, temperature 97.6 F (36.4 C), temperature source Oral, resp. rate 1, height _0  (1.727 m), weight 120 lb (54.432 kg), SpO2 100.00%.  Intake/Output from previous day: 08/28 0701 - 08/29 0700 In: 710 [I.V.:710] Out: -   Physical Exam:  HEENT: No thrush or ulcers Lungs: Clear bilaterally Cardiac: Regular rate and rhythm Abdomen: Soft and nontender Extremities: No leg edema Skin: Radiation hyperpigmentation at the upper and lower back with a few areas of superficial skin breakdown. Erythema and a yeast type rash at the perineum. External hemorrhoids. Breasts: Mass occupying the majority of the upper outer right breast    Lab Results:  Recent Labs  10/10/13 1901 10/11/13 0437  WBC 3.9* 4.0  HGB 12.8 11.5*  HCT 40.0 36.5  PLT 222 191    BMET  Recent Labs  10/10/13 1901 10/11/13 0437  NA 141 142  K 3.6* 3.9  CL 103 109  CO2 24 22  GLUCOSE 86 87  BUN 11 8  CREATININE 0.28* 0.28*  CALCIUM 9.8 9.3     Medications: I have reviewed the patient's current medications.  Assessment/Plan:  1. metastatic breast cancer-right breast mass with extensive bone metastases, ER positive, HER-2  positive, maintained on Femara and lapatinib prior to hospital admission  2. Status post palliative radiation to the thoracic/lumbar spine and right hip completed 09/19/2013  3. history of hypercalcemia  4. pulmonary embolism-maintained on Lovenox  5. diarrhea-most likely secondary to lapatinib, C. difficile assay pending  6. weight loss/malnutrition  7.  history of migraine headaches  8.  history of spinal cord injury  Kristie Jackson has metastatic breast cancer. She presents with diarrhea and failure to thrive. I suspect the diarrhea secondary to lapatinib and potentially complicated by radiation.  Recommendations: 1. continue Femara 2. Place lapatinib on hold 3. followup C. difficile toxin assay 4. continue Lovenox anticoagulation 5. skin care evaluation for the superficial breakdown over the back and perineum 6. consider resuming lapatinib at a reduced dose versus adding Herceptin 7. biphosphonate therapy as an outpatient  Oncology will continue following her in the hospital. We will arrange for outpatient followup at the [place].  LOS: 1 day   Kristie Jackson  10/11/2013, 9:05 AM

## 2013-10-11 NOTE — Progress Notes (Signed)
Patient ID: Kristie Jackson, female   DOB: [birth year], 58 y.o.   MRN: [id number] [facility] PROGRESS NOTE  Kristie Jackson [id number]:[id number] DOB: June 10, 1955 DOA: 10/10/2013 PCP: [name], MD  Brief narrative: 58 year old female with past medical history of breast cancer with bone metastases (diagnosed in 08/2013), started Femara 09/05/2013, status post palliative radiotherapy to thoracic and lumbosacral spine and right hip (09/19/2013), started lapatanib 09/12/2013. She presented to [facility] ED 10/10/2013 with profuse diarrhea started shortly after starting lapatanib. She was found to have C.diff on this admission and started on flagyl.  Assessment/Plan:    Principal Problem: C. Diff enteritis / Diarrhea / Dehydration  C.diff by PCR positive. Started flagyl.  Continue supportive care for dehydration: IV fluids  Antiemetics as needed  Active Problems:   Protein-calorie malnutrition, severe  In the context of chronic illness  Nutrition consulted    Breast cancer with bone metastases  Appreciate oncology following.  ER positive, HER-2 positive, maintained on Femara and lapatinib prior to hospital admission   Status post palliative radiation to the thoracic/lumbar spine and right hip completed 09/19/2013  Pulmonary embolism  Maintained on Lovenox  DVT Prophylaxis   Lovenox subQ  Code Status: Full.  Family Communication:  plan of care discussed with the patient Disposition Plan: Home when stable.    IV Access:   Peripheral IV Procedures and diagnostic studies:   No results found. Medical Consultants:   Dr. Betsy Coder, Oncology  Other Consultants:   None  Anti-Infectives:   Flagyl 10/11/2013 -->   Leisa Lenz, MD  [facility] Pager [phone]  If 7PM-7AM, please contact night-coverage [email or web address] Password [facility] 10/11/2013, 8:44 PM   LOS: 1 day    HPI/Subjective: No acute overnight events.  Objective: Filed Vitals:   10/11/13 0956 10/11/13 1442  10/11/13 1840 10/11/13 2005  BP: 123/74 109/80 96/64 110/59  Pulse: 76 70 78 82  Temp: 97.5 F (36.4 C) 98.1 F (36.7 C) 98 F (36.7 C) 97.9 F (36.6 C)  TempSrc: Oral Oral Oral Oral  Resp: _0 Height:      Weight:      SpO2: 98% 100% 100% 100%    Intake/Output Summary (Last 24 hours) at 10/11/13 2044 Last data filed at 10/11/13 1959  Gross per 24 hour  Intake   3497 ml  Output      0 ml  Net   3497 ml    Exam:   General:  Pt is alert, follows commands appropriately, not in acute distress  Cardiovascular: Regular rate and rhythm, S1/S2, no murmurs  Respiratory: Clear to auscultation bilaterally, no wheezing, no crackles, no rhonchi  Abdomen: Soft, non tender, non distended, bowel sounds present  Extremities: No edema, pulses DP and PT palpable bilaterally; rash on perineum  Neuro: Grossly nonfocal  Data Reviewed: Basic Metabolic Panel:  Recent Labs Lab 10/10/13 1901 10/11/13 0437  NA 141 142  K 3.6* 3.9  CL 103 109  CO2 24 22  GLUCOSE 86 87  BUN 11 8  CREATININE 0.28* 0.28*  CALCIUM 9.8 9.3   Liver Function Tests:  Recent Labs Lab 10/10/13 1901  AST 26  ALT 25  ALKPHOS 100  BILITOT <0.2*  PROT 6.2  ALBUMIN 2.8*   No results found for this basename: LIPASE, AMYLASE,  in the last 168 hours No results found for this basename: AMMONIA,  in the last 168 hours CBC:  Recent Labs Lab 10/10/13 1901 10/11/13 0437  WBC 3.9* 4.0  HGB  12.8 11.5*  HCT 40.0 36.5  MCV 97.3 99.2  PLT 222 191   Cardiac Enzymes: No results found for this basename: CKTOTAL, CKMB, CKMBINDEX, TROPONINI,  in the last 168 hours BNP: No components found with this basename: POCBNP,  CBG: No results found for this basename: GLUCAP,  in the last 168 hours  Recent Results (from the past 240 hour(s))  CLOSTRIDIUM DIFFICILE BY PCR     Status: Abnormal   Collection Time    10/11/13  1:26 AM      Result Value Ref Range Status   C difficile by pcr POSITIVE (*)  NEGATIVE Final   Comment: CRITICAL RESULT CALLED TO, READ BACK BY AND VERIFIED WITH:     MINTON D.,RN 10/11/13 1032 BY JONESJ     Performed at [facility]     Scheduled Meds: . DULoxetine  60 mg Oral QHS  . enoxaparin  60 mg Subcutaneous Q12H  . feeding supplement (ENSURE)  1 Container Oral TID BM  . levothyroxine  75 mcg Oral Once per day on Sun Tue Wed Fri Sat  . metronidazole  500 mg Intravenous 3 times per day  . pantoprazole  40 mg Oral Daily  . sucralfate  1 g Oral TID WC & HS  . tiZANidine  8 mg Oral TID  . topiramate  100 mg Oral QHS   Continuous Infusions: . sodium chloride 100 mL/hr at 10/11/13 0355

## 2013-10-12 MED ORDER — UNJURY CHICKEN SOUP POWDER
8.0000 [oz_av] | Freq: Every day | ORAL | Status: DC
  Administered 2013-10-12: 8 [oz_av] via ORAL
  Filled 2013-10-12 (×4): qty 27

## 2013-10-12 MED ORDER — LETROZOLE 2.5 MG PO TABS
2.5000 mg | ORAL_TABLET | Freq: Every day | ORAL | Status: DC
  Administered 2013-10-12 – 2013-10-15 (×4): 2.5 mg via ORAL
  Filled 2013-10-12 (×4): qty 1

## 2013-10-12 MED ORDER — TRIAMCINOLONE 0.1 % CREAM:EUCERIN CREAM 1:1
TOPICAL_CREAM | Freq: Two times a day (BID) | CUTANEOUS | Status: DC
  Administered 2013-10-12 – 2013-10-14 (×5): via TOPICAL
  Administered 2013-10-14: 1 via TOPICAL
  Administered 2013-10-15: 10:00:00 via TOPICAL
  Filled 2013-10-12: qty 1

## 2013-10-12 NOTE — Progress Notes (Signed)
Positive C. difficile toxin noted.  Continue antibiotics per the medical service. Hold lapatinib.  Oncology will check on her over the next few days.

## 2013-10-12 NOTE — Progress Notes (Signed)
Patient ID: Kristie Jackson, female   DOB: [birth year], 58 y.o.   MRN: [id number] [facility] PROGRESS NOTE  Kristie Jackson [id number] DOB: 1955-11-10 DOA: 10/10/2013 PCP: [name], MD  Brief narrative: 58 year old female with past medical history of breast cancer with bone metastases (diagnosed in 08/2013), started Femara 09/05/2013, status post palliative radiotherapy to thoracic and lumbosacral spine and right hip (09/19/2013), started lapatanib 09/12/2013. She presented to [facility] ED 10/10/2013 with profuse diarrhea started shortly after starting lapatanib. She was found to have C.diff on this admission and started on flagyl.   Assessment/Plan:   Principal Problem:  C. Diff enteritis / Diarrhea / Dehydration  C.diff by PCR positive. Started flagyl.  Continue supportive care for dehydration: IV fluids but may reduce rate to 50 cc/hr Antiemetics as needed  Active Problems:  Protein-calorie malnutrition, severe  In the context of chronic illness  Nutrition consulted  Continue ensure supplements Breast cancer with bone metastases  Appreciate oncology following.  ER positive, HER-2 positive, maintained on Femara and lapatinib prior to hospital admission  Status post palliative radiation to the thoracic/lumbar spine and right hip completed 09/19/2013  Restarted femara Hypothyroidism  Continue synthroid 75 mcg daily Skin rash  Residual rash on buttocks, right more than left. Also dry skin on bilateral heels. Per WOC recommendations pt needs constant low pressure with low air loss therapy sleep surface and twice daily application of triamcinolone cream in Eucerin base (1:1) for 14 days. Migraine headaches  Continue topiramate Pulmonary embolism  Maintained on Lovenox  DVT Prophylaxis  Lovenox subQ   Code Status: Full.  Family Communication: plan of care discussed with the patient  Disposition Plan: Home when stable.    IV Access:   Peripheral IV Procedures and  diagnostic studies:   No results found.  Medical Consultants:   Dr. Betsy Coder, Oncology  Other Consultants:   WOC Anti-Infectives:   Flagyl 10/11/2013 -->      Leisa Lenz, MD  [facility] Pager [phone]  If 7PM-7AM, please contact night-coverage [email or web address] Password [email or web address] 10/12/2013, 3:17 PM   LOS: 2 days    HPI/Subjective: No acute overnight events.  Objective: Filed Vitals:   10/11/13 2005 10/12/13 0453 10/12/13 0950 10/12/13 1430  BP: 110/59 115/72 111/56 106/64  Pulse: 82 81 93 91  Temp: 97.9 F (36.6 C) 98.5 F (36.9 C) 97.8 F (36.6 C) 97.4 F (36.3 C)  TempSrc: Oral Oral Oral Oral  Resp: 16 16 16 18   Height:      Weight:      SpO2: 100% 100% 100% 98%    Intake/Output Summary (Last 24 hours) at 10/12/13 1517 Last data filed at 10/12/13 1300  Gross per 24 hour  Intake   3344 ml  Output      0 ml  Net   3344 ml    Exam:   General:  Pt is alert, follows commands appropriately, not in acute distress  Cardiovascular: Regular rate and rhythm, S1/S2, no murmurs  Respiratory: Clear to auscultation bilaterally, no wheezing, no crackles, no rhonchi  Abdomen: Soft, non tender, non distended, bowel sounds present  Extremities: No edema, pulses DP and PT palpable bilaterally  Neuro: Grossly nonfocal  Data Reviewed: Basic Metabolic Panel:  Recent Labs Lab 10/10/13 1901 10/11/13 0437  NA 141 142  K 3.6* 3.9  CL 103 109  CO2 24 22  GLUCOSE 86 87  BUN 11 8  CREATININE 0.28* 0.28*  CALCIUM 9.8 9.3   Liver Function Tests:  Recent  Labs Lab 10/10/13 1901  AST 26  ALT 25  ALKPHOS 100  BILITOT <0.2*  PROT 6.2  ALBUMIN 2.8*   No results found for this basename: LIPASE, AMYLASE,  in the last 168 hours No results found for this basename: AMMONIA,  in the last 168 hours CBC:  Recent Labs Lab 10/10/13 1901 10/11/13 0437  WBC 3.9* 4.0  HGB 12.8 11.5*  HCT 40.0 36.5  MCV 97.3 99.2  PLT 222 191   Cardiac Enzymes: No  results found for this basename: CKTOTAL, CKMB, CKMBINDEX, TROPONINI,  in the last 168 hours BNP: No components found with this basename: POCBNP,  CBG: No results found for this basename: GLUCAP,  in the last 168 hours  Recent Results (from the past 240 hour(s))  CLOSTRIDIUM DIFFICILE BY PCR     Status: Abnormal   Collection Time    10/11/13  1:26 AM      Result Value Ref Range Status   C difficile by pcr POSITIVE (*) NEGATIVE Final   Comment: CRITICAL RESULT CALLED TO, READ BACK BY AND VERIFIED WITH:     MINTON D.,RN 10/11/13 1032 BY JONESJ     Performed at [place]     Status: None   Collection Time    10/11/13  1:26 AM      Result Value Ref Range Status   Specimen Description STOOL   Final   Special Requests Immunocompromised   Final   Culture     Final   Value: Culture reincubated for better growth     Performed at [facility]   Report Status PENDING   Incomplete     Scheduled Meds: . DULoxetine  60 mg Oral QHS  . enoxaparin  60 mg Subcutaneous Q12H  . feeding supplement (ENSURE)  1 Container Oral TID BM  . letrozole  2.5 mg Oral Daily  . levothyroxine  75 mcg Oral Once per day on Sun Tue Wed Fri Sat  . metronidazole  500 mg Intravenous 3 times per day  . pantoprazole  40 mg Oral Daily  . sucralfate  1 g Oral TID WC & HS  . tiZANidine  8 mg Oral TID  . topiramate  100 mg Oral QHS  . triamcinolone 0.1 % cream : eucerin   Topical BID   Continuous Infusions: . sodium chloride 100 mL/hr at 10/12/13 [phone]

## 2013-10-12 NOTE — Consult Note (Signed)
WOC wound consult note Reason for Consult: Moisture associated skin damage secondary to C. diff diarrhea, improving.  Skin with residual rash on buttocks (R>L) and inner thighs.  Dry skin on bilateral heels. Wound type:Moisture associated skin damage with residual rash Pressure Ulcer POA: No Measurement:Diffuse rash on bilateral buttocks, R>L. Appearance is not fungal (no confluent center, no satellite lesions. Wound bed: NO open wounds.  Maculopapular rash. Drainage (amount, consistency, odor) None Periwound:intact, dry Dressing procedure/placement/frequency:I will add a constant low pressure with low air loss therapy sleep surface and recommend twice daily application of triamcinolone cream in Eucerin base (1:1) for 14 days.  With side lying positioning and in conjunction with low air loss therapy, expect skin to recover promptly. [place] nursing team will not follow, but will remain available to this patient, the nursing and medical team.  Please re-consult if needed. Thanks, Maudie Flakes, MSN, RN, [place], [place], [place] [phone])

## 2013-10-12 NOTE — Progress Notes (Addendum)
INITIAL NUTRITION ASSESSMENT  DOCUMENTATION CODES Per approved criteria  -Severe malnutrition in the context of chronic illness   INTERVENTION: Continue Ensure Complete po TID, each supplement provides 350 kcal and 13 grams of protein Add Unjury Chicken Broth once daily (100 kcal, 21 gm protein each. Diet as patient tolerates.   RD to follow   NUTRITION DIAGNOSIS: Inadequate oral intake related to altered gi function as evidenced by c.diff..   Goal: Diet tolerance with intake of meals and supplements to meet >90% estimated needs.  Monitor:  Intake, labs, weight trend, diet tolerance.  Reason for Assessment: MST, Consult to assess nutritional needs and status and nutrition for wound healing.  58 y.o. female  Admitting Dx: Diarrhea  ASSESSMENT: 58 year old female with past medical history of breast cancer with bone metastases (diagnosed in 08/2013), started Femara 09/05/2013, status post palliative radiotherapy to thoracic and lumbosacral spine and right hip (09/19/2013), started lapatanib 09/12/2013. She presented to [facility] ED 10/10/2013 with profuse diarrhea started shortly after starting lapatanib. She was found to have C.diff on this admission and started on flagyl.    8/30: -Ate 1/2 English Muffin and eggs today with immediate diarrhea. -Prefers to have broth and jello tonight -Likes Ensure and wishes to continue this. -Skin problems from c.diff. -Poor intake recently - Sodium low 117, potassium low 3.3 receiving KCl -Hx of severe malnutrition -Patient with a 20 lbs weight loss in the past month (15% loss) with with a total of 50-60  lb loss in the past 4-5 months. -Last seen by inpatient RD 09/04/13. -Continues to meet criteria for Severe Malnutrition related to chronic illness.  Height: Ht Readings from Last 1 Encounters:  10/10/13 5\' 8"  (1.727 m)    Weight: Wt Readings from Last 1 Encounters:  10/10/13 120 lb (54.432 kg)    Ideal Body Weight: 140 lbs  % Ideal  Body Weight: 85  Wt Readings from Last 10 Encounters:  10/10/13 120 lb (54.432 kg)  09/05/13 137 lb 2 oz (62.2 kg)  07/09/13 150 lb (68.04 kg)    Usual Body Weight: about 180 lbs  % Usual Body Weight: 67  BMI:  Body mass index is 18.25 kg/(m^2).  Estimated Nutritional Needs: Kcal: 1800-2000 Protein: 90-100 gm Fluid: 1.8-2L daily  Skin:  Damage secondary to c.diff.  Diet Order: General  EDUCATION NEEDS: -Education needs addressed   Intake/Output Summary (Last 24 hours) at 10/12/13 1605 Last data filed at 10/12/13 1300  Gross per 24 hour  Intake   3344 ml  Output      0 ml  Net   3344 ml    Last BM: today.  Diarrhea continues.   Labs:   Recent Labs Lab 10/10/13 1901 10/11/13 0437  NA 141 142  K 3.6* 3.9  CL 103 109  CO2 24 22  BUN 11 8  CREATININE 0.28* 0.28*  CALCIUM 9.8 9.3  GLUCOSE 86 87    CBG (last 3)  No results found for this basename: GLUCAP,  in the last 72 hours  Scheduled Meds: . DULoxetine  60 mg Oral QHS  . enoxaparin  60 mg Subcutaneous Q12H  . feeding supplement (ENSURE)  1 Container Oral TID BM  . letrozole  2.5 mg Oral Daily  . levothyroxine  75 mcg Oral Once per day on Sun Tue Wed Fri Sat  . metronidazole  500 mg Intravenous 3 times per day  . pantoprazole  40 mg Oral Daily  . sucralfate  1 g Oral TID  WC & HS  . tiZANidine  8 mg Oral TID  . topiramate  100 mg Oral QHS  . triamcinolone 0.1 % cream : eucerin   Topical BID    Continuous Infusions: . sodium chloride 100 mL/hr at 10/12/13 2919    Past Medical History  Diagnosis Date  . Central hypothyroidism   . Hyperlipidemia   . Spinal cord injury   . Depression   . Cancer     spinal metastasis  . PONV (postoperative nausea and vomiting)     Past Surgical History  Procedure Laterality Date  . Laminectomy    . Abdominal hysterectomy      Kristie Jackson, RD, LDN Clinical Inpatient Dietitian Pager:  [phone] Weekend and after hours pager:  [phone]

## 2013-10-13 NOTE — Progress Notes (Signed)
Met patient in her room at [facility]. Offered support and provided education about support center services. Patient said she had a friend with her and that she was comfortable and not in need of anything at this time. Listening; presence.  Epifania Gore, PhD, [place]

## 2013-10-13 NOTE — Progress Notes (Signed)
Patient ID: Kristie Jackson, female   DOB: 09/16/1955, 58 y.o.   MRN: [id number] [facility] PROGRESS NOTE  Evian Derringer [id number]:[id number] DOB: May 26, 1955 DOA: 10/10/2013 PCP: [name], MD  Brief narrative: 58 year old female with past medical history of breast cancer with bone metastases (diagnosed in 08/2013), started Femara 09/05/2013, status post palliative radiotherapy to thoracic and lumbosacral spine and right hip (09/19/2013), started lapatanib 09/12/2013. She presented to [facility] ED 10/10/2013 with profuse diarrhea started shortly after starting lapatanib. She was found to have C.diff on this admission and started on flagyl.   Assessment/Plan:   Principal Problem:  C. Diff enteritis / Diarrhea / Dehydration  C.diff by PCR positive. Started flagyl.  Continue supportive care with IV fluids.  Antiemetics as needed  Active Problems:  Protein-calorie malnutrition, severe  In the context of chronic illness  Continue ensure supplements Breast cancer with bone metastases  Appreciate oncology following.  ER positive, HER-2 positive, maintained on Femara and lapatinib prior to hospital admission  Status post palliative radiation to the thoracic/lumbar spine and right hip completed 09/19/2013  Restarted femara Hypothyroidism   Continue synthroid 75 mcg daily Skin rash  Residual rash on buttocks, right more than left. Also dry skin on bilateral heels. Per WOC recommendations pt needs constant low pressure with low air loss therapy sleep surface and twice daily application of triamcinolone cream in Eucerin base (1:1) for 14 days. Migraine headaches   Continue topiramate Pulmonary embolism  Continue Lovenox  DVT Prophylaxis  Lovenox subQ   Code Status: Full.  Family Communication: plan of care discussed with the patient  Disposition Plan: Home when stable.    IV Access:   Peripheral IV Procedures and diagnostic studies:   No results found.  Medical Consultants:   Dr. Betsy Coder, Oncology  Other Consultants:   [facility] Anti-Infectives:   Flagyl 10/11/2013 -->   Leisa Lenz, MD  [facility] Pager [phone]  If 7PM-7AM, please contact night-coverage [email or web address] Password [facility] 10/13/2013, 2:37 PM   LOS: 3 days    HPI/Subjective: No acute overnight events.  Objective: Filed Vitals:   10/12/13 2026 10/13/13 0154 10/13/13 0433 10/13/13 1000  BP: 97/52 109/61 117/76 119/73  Pulse: 76 70 82 70  Temp: 98 F (36.7 C) 97.6 F (36.4 C) 97.7 F (36.5 C) 98.1 F (36.7 C)  TempSrc: Oral Oral Oral Oral  Resp: _0 Height:      Weight:      SpO2: 99% 98% 100% 100%    Intake/Output Summary (Last 24 hours) at 10/13/13 1437 Last data filed at 10/13/13 1200  Gross per 24 hour  Intake 6038.67 ml  Output      0 ml  Net 6038.67 ml    Exam:   General:  Pt is alert, not in acute distress  Cardiovascular: Regular rate and rhythm, S1/S2 appreciated   Respiratory: no wheezing, no rhonchi  Abdomen: Soft, non tender, non distended, bowel sounds present  Extremities: No edema, pulses DP and PT palpable bilaterally  Neuro: Grossly nonfocal  Data Reviewed: Basic Metabolic Panel:  Recent Labs Lab 10/10/13 1901 10/11/13 0437  NA 141 142  K 3.6* 3.9  CL 103 109  CO2 24 22  GLUCOSE 86 87  BUN 11 8  CREATININE 0.28* 0.28*  CALCIUM 9.8 9.3   Liver Function Tests:  Recent Labs Lab 10/10/13 1901  AST 26  ALT 25  ALKPHOS 100  BILITOT <0.2*  PROT 6.2  ALBUMIN 2.8*   No  results found for this basename: LIPASE, AMYLASE,  in the last 168 hours No results found for this basename: AMMONIA,  in the last 168 hours CBC:  Recent Labs Lab 10/10/13 1901 10/11/13 0437  WBC 3.9* 4.0  HGB 12.8 11.5*  HCT 40.0 36.5  MCV 97.3 99.2  PLT 222 191   Cardiac Enzymes: No results found for this basename: CKTOTAL, CKMB, CKMBINDEX, TROPONINI,  in the last 168 hours BNP: No components found with this basename: POCBNP,  CBG: No results  found for this basename: GLUCAP,  in the last 168 hours  Recent Results (from the past 240 hour(s))  CLOSTRIDIUM DIFFICILE BY PCR     Status: Abnormal   Collection Time    10/11/13  1:26 AM      Result Value Ref Range Status   C difficile by pcr POSITIVE (*) NEGATIVE Final   Comment: CRITICAL RESULT CALLED TO, READ BACK BY AND VERIFIED WITH:     MINTON D.,RN 10/11/13 1032 BY JONESJ     Performed at [place] CULTURE     Status: None   Collection Time    10/11/13  1:26 AM      Result Value Ref Range Status   Specimen Description STOOL   Final   Special Requests Immunocompromised   Final   Culture     Final   Value: NO SUSPICIOUS COLONIES, CONTINUING TO HOLD     Performed at [facility]   Report Status PENDING   Incomplete     Scheduled Meds: . DULoxetine  60 mg Oral QHS  . enoxaparin  60 mg Subcutaneous Q12H  . feeding supplement (ENSURE)  1 Container Oral TID BM  . letrozole  2.5 mg Oral Daily  . levothyroxine  75 mcg Oral Once per day on Sun Tue Wed Fri Sat  . metronidazole  500 mg Intravenous 3 times per day  . pantoprazole  40 mg Oral Daily  . protein supplement  8 oz Oral QPC supper  . sucralfate  1 g Oral TID WC & HS  . tiZANidine  8 mg Oral TID  . topiramate  100 mg Oral QHS  . triamcinolone 0.1 % cream : eucerin   Topical BID   Continuous Infusions: . sodium chloride 100 mL/hr at 10/13/13 [phone]

## 2013-10-13 NOTE — Progress Notes (Signed)
Pt repositioned q3 hr. Sleeping long intervals. States she doesn't have an appetite.

## 2013-10-13 NOTE — Plan of Care (Signed)
Problem: Phase I Progression Outcomes Goal: Voiding-avoid urinary catheter unless indicated Outcome: Not Progressing Incontinent-spinal cord injury

## 2013-10-14 ENCOUNTER — Encounter (HOSPITAL_COMMUNITY): Payer: Self-pay | Admitting: *Deleted

## 2013-10-14 DIAGNOSIS — R5383 Other fatigue: Secondary | ICD-10-CM

## 2013-10-14 DIAGNOSIS — R5381 Other malaise: Secondary | ICD-10-CM

## 2013-10-14 NOTE — Progress Notes (Signed)
Kristie Jackson was seen and examined and chart was reviewed.  Subjective: Complains of diarrhea after eating food diet or liquids, feels very weak and tired. Completed radiation therapy to the back and hip. Currently off lapatinib.  Objective: Filed Vitals:   10/14/13 0638  BP: 116/67  Pulse: 76  Temp: 97.6 F (36.4 C)  Resp: 20   HEENT: Oral mucosa is dry Lungs: Clear to auscultation bilaterally Heart: S1-S2 normal Abdomen: Soft nondistended no tenderness to palpation good bowel sounds Extremities no edema Neurological exam grossly intact   Labs: Creatinine 0.28, prealbumin 22.6, hemoglobin 11.5 platelets 191  Assessment and plan: 1. C. difficile enterocolitis: Patient is currently on Flagyl 500 mg IV every 8 hours and she appears to be slowly improving. Patient feels that she's not ready to be discharged at and that every time she drinks or eats she has profound diarrhea. With her prior history of partial quadriplegia, she will need a lot of assistance with ADLs. 2. 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 Once she is discharged we will discuss alternative anti-HER-2 therapies .  I will assume her care in the outpatient setting since Dr. [name] is no longer here. Please do not hesitate to call me with any questions or concerns.

## 2013-10-14 NOTE — Plan of Care (Signed)
Problem: Phase III Progression Outcomes Goal: Voiding independently Outcome: Completed/Met Date Met:  10/14/13 Incontinent of urine

## 2013-10-14 NOTE — Plan of Care (Signed)
Problem: Phase I Progression Outcomes Goal: Other Phase I Outcomes/Goals Outcome: Progressing # and frequency of stools decreasing

## 2013-10-14 NOTE — Care Management Note (Signed)
CARE MANAGEMENT NOTE 10/14/2013  Patient:  Kristie Jackson, Kristie Jackson   Account Number:  [id number]  Date Initiated:  10/14/2013  Documentation initiated by:  Leafy Kindle  Subjective/Objective Assessment:   58 yo admitted with Diarrhea, C-diff positive. Hx of: Central hypothyroidism  .  Hyperlipidemia  .  Spinal cord injury  .  Depression  .  Cancer      spinal metastasis     Action/Plan:   From home with Aide and PT/OT from [facility].   Anticipated DC Date:  10/15/2013   Anticipated DC Plan:  [place]  CM consult      [facility] Choice  Resumption Of Svcs/PTA Provider   Choice offered to / List presented to:             Status of service:  In process, will continue to follow Medicare Important Message given?  YES (If response is "NO", the following Medicare IM given date fields will be blank) Date Medicare IM given:  10/14/2013 Medicare IM given by:  Leafy Kindle Date Additional Medicare IM given:   Additional Medicare IM given by:    Discharge Disposition:    Per UR Regulation:  Reviewed for med. necessity/level of care/duration of stay  If discussed at [place] of Stay Meetings, dates discussed:    Comments:  10/14/13 Marney Doctor RN,BSN,NCM Pt from home alone.  Uses [facility] for PT/OT.  Will need resumption orders at DC.  CM will continue to follow.

## 2013-10-14 NOTE — Progress Notes (Signed)
Patient ID: Kristie Jackson, female   DOB: [birth year], 58 y.o.   MRN: [id number] [facility] PROGRESS NOTE  Kristie Jackson [id number] DOB: 1956/02/02 DOA: 10/10/2013 PCP: [name], MD  Brief narrative: 58 year old female with past medical history of breast cancer with bone metastases (diagnosed in 08/2013), started Femara 09/05/2013, status post palliative radiotherapy to thoracic and lumbosacral spine and right hip (09/19/2013), started lapatanib 09/12/2013. She presented to [facility] ED 10/10/2013 with profuse diarrhea started shortly after starting lapatanib. She was found to have C.diff on this admission and started on flagyl.   Assessment/Plan:    Principal Problem:  C. Diff enteritis / Diarrhea / Dehydration  C.diff by PCR positive. Started flagyl.  Patient still feels very weak. She only had one episode of diarrhea over past 24 hours but her appetite is sluggish. Will continue to monitor for next 24 hours. Diet advanced to regular but patient prefers to be on clear liquid diet for today. Continue supportive care with IV fluids.  Antiemetics as needed  Active Problems:  Protein-calorie malnutrition, severe  In the context of chronic illness  Continue ensure supplements Breast cancer with bone metastases  Appreciate oncology following.  ER positive, HER-2 positive, maintained on Femara and lapatinib prior to hospital admission  Status post palliative radiation to the thoracic/lumbar spine and right hip completed 09/19/2013  Restarted femara Hypothyroidism  Continue synthroid 75 mcg daily Skin rash  Residual rash on buttocks, right more than left. Also dry skin on bilateral heels. Per WOC recommendations pt needs constant low pressure with low air loss therapy sleep surface and twice daily application of triamcinolone cream in Eucerin base (1:1) for 14 days. Migraine headaches   Continue topiramate Pulmonary embolism  Continue Lovenox  DVT Prophylaxis  Lovenox  subQ   Code Status: Full.  Family Communication: plan of care discussed with the patient  Disposition Plan: Home when stable.    IV Access:   Peripheral IV Procedures and diagnostic studies:   No results found.  Medical Consultants:   Dr. Betsy Coder, Oncology  Other Consultants:   WOC Anti-Infectives:   Flagyl 10/11/2013 -->   Leisa Lenz, MD  [facility] Pager [phone]  If 7PM-7AM, please contact night-coverage [email or web address] Password [email or web address] 10/14/2013, 10:39 AM   LOS: 4 days    HPI/Subjective: No acute overnight events.  Objective: Filed Vitals:   10/13/13 1400 10/13/13 1800 10/13/13 2210 10/14/13 0638  BP: 112/68 110/70 119/78 116/67  Pulse: 73 78 79 76  Temp: 97.6 F (36.4 C) 97.8 F (36.6 C) 97.9 F (36.6 C) 97.6 F (36.4 C)  TempSrc: Oral Oral Oral Oral  Resp: 16 16 20 20   Height:      Weight:      SpO2: 100% 100% 99% 99%    Intake/Output Summary (Last 24 hours) at 10/14/13 1039 Last data filed at 10/14/13 0700  Gross per 24 hour  Intake 4496.67 ml  Output      0 ml  Net 4496.67 ml    Exam:   General:  Pt is awake, feels weak   Cardiovascular: Regular rate and rhythm, S1/S2 appreciated   Respiratory: bilateral air entry, no wheezing   Abdomen: non tender, non distended; (+) BS  Extremities: No edema, pulses DP and PT palpable bilaterally  Neuro: Grossly nonfocal  Data Reviewed: Basic Metabolic Panel:  Recent Labs Lab 10/10/13 1901 10/11/13 0437  NA 141 142  K 3.6* 3.9  CL 103 109  CO2 24 22  GLUCOSE 86 87  BUN 11 8  CREATININE 0.28* 0.28*  CALCIUM 9.8 9.3   Liver Function Tests:  Recent Labs Lab 10/10/13 1901  AST 26  ALT 25  ALKPHOS 100  BILITOT <0.2*  PROT 6.2  ALBUMIN 2.8*   No results found for this basename: LIPASE, AMYLASE,  in the last 168 hours No results found for this basename: AMMONIA,  in the last 168 hours CBC:  Recent Labs Lab 10/10/13 1901 10/11/13 0437  WBC 3.9* 4.0  HGB 12.8  11.5*  HCT 40.0 36.5  MCV 97.3 99.2  PLT 222 191   Cardiac Enzymes: No results found for this basename: CKTOTAL, CKMB, CKMBINDEX, TROPONINI,  in the last 168 hours BNP: No components found with this basename: POCBNP,  CBG: No results found for this basename: GLUCAP,  in the last 168 hours  CLOSTRIDIUM DIFFICILE BY PCR     Status: Abnormal   Collection Time    10/11/13  1:26 AM      Result Value Ref Range Status   C difficile by pcr POSITIVE (*) NEGATIVE Final   Comment: CRITICAL RESULT CALLED TO, READ BACK BY AND VERIFIED WITH:     MINTON D.,RN 10/11/13 1032 BY JONESJ     Performed at [place]     Status: None   Collection Time    10/11/13  1:26 AM      Result Value Ref Range Status   Specimen Description STOOL   Final   Special Requests Immunocompromise  Final   Culture     Final   Value: NO SUSPICIOUS COLONIES, CONTINUING TO HOLD     Performed at [facility]   Report Status PENDING   Incomplete     Scheduled Meds: . DULoxetine  60 mg Oral QHS  . enoxaparin  60 mg Subcutaneous Q12H  . feeding supplement (ENSURE)  1 Container Oral TID BM  . letrozole  2.5 mg Oral Daily  . levothyroxine  75 mcg Oral Once per day on Sun Tue Wed Fri Sat  . metronidazole  500 mg Intravenous 3 times per day  . pantoprazole  40 mg Oral Daily  . protein supplement  8 oz Oral QPC supper  . sucralfate  1 g Oral TID WC & HS  . tiZANidine  8 mg Oral TID  . topiramate  100 mg Oral QHS  . triamcinolone 0.1 % cream : eucerin   Topical BID   Continuous Infusions: . sodium chloride 100 mL/hr at 10/14/13 0511

## 2013-10-15 LAB — BASIC METABOLIC PANEL
ANION GAP: 10 (ref 5–15)
BUN: 3 mg/dL — ABNORMAL LOW (ref 6–23)
CALCIUM: 8.7 mg/dL (ref 8.4–10.5)
CO2: 24 mEq/L (ref 19–32)
Chloride: 107 mEq/L (ref 96–112)
Creatinine, Ser: 0.33 mg/dL — ABNORMAL LOW (ref 0.50–1.10)
GFR calc Af Amer: >90 mL/min (ref >90)
Glucose, Bld: 113 mg/dL — ABNORMAL HIGH (ref 70–99)
Potassium: 2.9 mEq/L — CL (ref 3.7–5.3)
Sodium: 141 mEq/L (ref 137–147)

## 2013-10-15 LAB — CBC
HCT: 29.7 % — ABNORMAL LOW (ref 36.0–46.0)
Hemoglobin: 9.9 g/dL — ABNORMAL LOW (ref 12.0–15.0)
MCH: 31.6 pg (ref 26.0–34.0)
MCHC: 33.3 g/dL (ref 30.0–36.0)
MCV: 94.9 fL (ref 78.0–100.0)
PLATELETS: 222 10*3/uL (ref 150–400)
RBC: 3.13 MIL/uL — ABNORMAL LOW (ref 3.87–5.11)
RDW: 19 % — AB (ref 11.5–15.5)
WBC: 3.6 10*3/uL — ABNORMAL LOW (ref 4.0–10.5)

## 2013-10-15 LAB — STOOL CULTURE

## 2013-10-15 MED ORDER — POTASSIUM CHLORIDE CRYS ER 20 MEQ PO TBCR
40.0000 meq | EXTENDED_RELEASE_TABLET | Freq: Once | ORAL | Status: AC
  Administered 2013-10-15: 40 meq via ORAL
  Filled 2013-10-15: qty 2

## 2013-10-15 MED ORDER — METRONIDAZOLE 500 MG PO TABS: 500.0000 mg | ORAL_TABLET | Freq: Three times a day (TID) | ORAL | Status: DC

## 2013-10-15 MED ORDER — POTASSIUM CHLORIDE 10 MEQ/100ML IV SOLN
10.0000 meq | INTRAVENOUS | Status: DC
  Administered 2013-10-15: 10 meq via INTRAVENOUS
  Filled 2013-10-15 (×4): qty 100

## 2013-10-15 MED ORDER — POTASSIUM CHLORIDE 20 MEQ PO PACK: 20.0000 meq | PACK | Freq: Every day | ORAL | Status: DC

## 2013-10-15 MED ORDER — DULOXETINE HCL 60 MG PO CPEP: 60.0000 mg | ORAL_CAPSULE | Freq: Every day | ORAL | Status: DC

## 2013-10-15 NOTE — Progress Notes (Signed)
Pt. Was discharged home she was given her discharge information, prescriptions, and all questions were answered.  Pt. Is to be transported home by non emergent ambulance services.

## 2013-10-15 NOTE — Care Management Note (Signed)
CARE MANAGEMENT NOTE 10/15/2013  Patient:  Kristie Jackson, Kristie Jackson   Account Number:  [id number]  Date Initiated:  10/14/2013  Documentation initiated by:  Leafy Kindle  Subjective/Objective Assessment:   58 yo admitted with Diarrhea, C-diff positive. Hx of: Central hypothyroidism  .  Hyperlipidemia  .  Spinal cord injury  .  Depression  .  Cancer      spinal metastasis     Action/Plan:   From home with Aide and PT/OT from [facility]   Anticipated DC Date:  10/15/2013   Anticipated DC Plan:  [place]  CM consult      [facility] Choice  Resumption Of Svcs/PTA Provider   Choice offered to / List presented to:          [facility] arranged  [place] OT      [place].   Status of service:  In process, will continue to follow Medicare Important Message given?  YES (If response is "NO", the following Medicare IM given date fields will be blank) Date Medicare IM given:  10/14/2013 Medicare IM given by:  Leafy Kindle Date Additional Medicare IM given:   Additional Medicare IM given by:    Discharge Disposition:    Per UR Regulation:  Reviewed for med. necessity/level of care/duration of stay  If discussed at [place] of Stay Meetings, dates discussed:    Comments:  10/15/13 Marney Doctor RN,BSN,NCM [facility] orders written.  Erasmo Downer from [facility] texted to make aware of DC.  CM will sign off.   10/14/13 Marney Doctor RN,BSN,NCM Pt from home alone.  Uses [facility] for PT/OT.  Will need resumption orders at DC.  CM will continue to follow.

## 2013-10-15 NOTE — Progress Notes (Signed)
CSW consulted for transportation needs. Patient will need non-emergency ambulance transport home. CSW confirmed home address with patient (Kristie Jackson). [facility] called for transport. No other CSW needs identified - CSW signing off.   Raynaldo Opitz, [facility] Clinical Social Worker cell #: [phone]

## 2013-10-15 NOTE — Discharge Summary (Signed)
Physician Discharge Summary  Kristie Jackson PPI:[id number] DOB: December 24, 1955 DOA: 10/10/2013  PCP: [name], MD  Admit date: 10/10/2013 Discharge date: 10/15/2013  Recommendations for Outpatient Follow-up:  1. Check CBC and BMP outpt. She has appt sch with [name] 10/16/13 at 1:45 pm. I have encouraged her to be compliant with appt sch. 2. Flagyl on discharge for 10 days.   Discharge Diagnoses:  Principal Problem:   Diarrhea Active Problems:   Protein-calorie malnutrition, severe   Breast cancer metastasized to multiple sites   Weight loss   Migraine headache    Discharge Condition: stable   Diet recommendation: as tolerated   History of present illness:  58 year old female with past medical history of breast cancer with bone metastases (diagnosed in 08/2013), started Femara 09/05/2013, status post palliative radiotherapy to thoracic and lumbosacral spine and right hip (09/19/2013), started lapatanib 09/12/2013. She presented to [facility] ED 10/10/2013 with profuse diarrhea started shortly after starting lapatanib. She was found to have C.diff on this admission and started on flagyl.   Assessment/Plan:   Principal Problem:  C. Diff enteritis / Diarrhea / Dehydration  C.diff by PCR positive. Started flagyl.  She will continue eO flagyl for 10 days on discharge.  Antiemetics as needed  Diet as tolerated  Active Problems:  Protein-calorie malnutrition, severe  In the context of chronic illness  Continue ensure supplements Breast cancer with bone metastases  Appreciate oncology following.  ER positive, HER-2 positive, maintained on Femara and lapatinib prior to hospital admission  Status post palliative radiation to the thoracic/lumbar spine and right hip completed 09/19/2013  Restarted femara Hypothyroidism  Continue synthroid 75 mcg daily Skin rash  Residual rash on buttocks, right more than left. Also dry skin on bilateral heels. Per WOC recommendations pt needs constant  low pressure with low air loss therapy sleep surface and twice daily application of triamcinolone cream in Eucerin base (1:1) for 14 days. Migraine headaches   Continue topiramate Pulmonary embolism  Continue Lovenox per home regimen  DVT Prophylaxis  Lovenox subQ which she takes for pulmonary embolism    Code Status: Full.  Family Communication: plan of care discussed with the patient and the family at the bedside    IV Access:   Peripheral IV Procedures and diagnostic studies:   No results found.  Medical Consultants:   Dr. Betsy Coder, Oncology  Other Consultants:   WOC Anti-Infectives:   Flagyl 10/11/2013 -->   Signed:  Leisa Lenz, MD  [facility] 10/15/2013, 9:51 AM  Pager #: [phone]   Discharge Exam: Filed Vitals:   10/14/13 2120  BP: 121/71  Pulse: 71  Temp: 98.5 F (36.9 C)  Resp: 20   Filed Vitals:   10/14/13 0638 10/14/13 1020 10/14/13 1310 10/14/13 2120  BP: 116/67 113/79 121/78 121/71  Pulse: 76 71 74 71  Temp: 97.6 F (36.4 C) 98.6 F (37 C) 98.4 F (36.9 C) 98.5 F (36.9 C)  TempSrc: Oral Oral Oral Oral  Resp: _0 Height:      Weight:      SpO2: 99% 99% 96% 100%    General: Pt is alert, follows commands appropriately, not in acute distress Cardiovascular: Regular rate and rhythm, S1/S2 +, no murmurs Respiratory: Clear to auscultation bilaterally, no wheezing, no crackles, no rhonchi Abdominal: Soft, non tender, non distended, bowel sounds +, no guarding Extremities: no edema, no cyanosis, pulses palpable bilaterally DP and PT Neuro: Grossly nonfocal  Discharge Instructions  Discharge Instructions  Call MD for:  difficulty breathing, headache or visual disturbances    Complete by:  As directed      Call MD for:  persistant dizziness or light-headedness    Complete by:  As directed      Call MD for:  persistant nausea and vomiting    Complete by:  As directed      Call MD for:  severe uncontrolled pain     Complete by:  As directed      Diet - low sodium heart healthy    Complete by:  As directed      Discharge instructions    Complete by:  As directed   1. Continue flagyl for 10 more days on discharge. Continue  Potassium supplementation as prescribed for next 5 days.     Increase activity slowly    Complete by:  As directed             Medication List    STOP taking these medications       dexamethasone 4 MG tablet  Commonly known as:  DECADRON     diphenoxylate-atropine 2.5-0.025 MG per tablet  Commonly known as:  LOMOTIL      TAKE these medications       diazepam 10 MG tablet  Commonly known as:  VALIUM  Take 10 mg by mouth every 6 (six) hours as needed. For muscle spasms     DULoxetine 60 MG capsule  Commonly known as:  CYMBALTA  Take 60 mg by mouth at bedtime.     enoxaparin 60 MG/0.6ML injection  Commonly known as:  LOVENOX  Inject 0.6 mLs (60 mg total) into the skin every 12 (twelve) hours.     esomeprazole 20 MG capsule  Commonly known as:  NEXIUM  Take 20 mg by mouth daily at 12 noon.     lapatinib 250 MG tablet  Commonly known as:  TYKERB  Take 5 tablets (1,250 mg total) by mouth daily.     letrozole 2.5 MG tablet  Commonly known as:  FEMARA  Take 1 tablet (2.5 mg total) by mouth daily.     levothyroxine 75 MCG tablet  Commonly known as:  SYNTHROID, LEVOTHROID  Take 75 mcg by mouth See admin instructions. Takes daily except Monday and Thursday     metroNIDAZOLE 500 MG tablet  Commonly known as:  FLAGYL  Take 1 tablet (500 mg total) by mouth 3 (three) times daily.     oxyCODONE-acetaminophen 5-325 MG per tablet  Commonly known as:  PERCOCET/ROXICET  Take 1-2 tablets by mouth every 4 (four) hours as needed for moderate pain or severe pain.     polyethylene glycol packet  Commonly known as:  MIRALAX / GLYCOLAX  Take 17 g by mouth daily as needed for mild constipation, moderate constipation or severe constipation.     potassium chloride 20 MEQ  packet  Commonly known as:  KLOR-CON  Take 20 mEq by mouth daily.     rizatriptan 10 MG tablet  Commonly known as:  MAXALT  Take 10 mg by mouth as needed for migraine. May repeat in 2 hours if needed     sucralfate 1 G tablet  Commonly known as:  CARAFATE  Take 1 g by mouth 4 (four) times daily -  with meals and at bedtime.     tiZANidine 4 MG tablet  Commonly known as:  ZANAFLEX  Take 8 mg by mouth 3 (three) times daily.     topiramate  100 MG tablet  Commonly known as:  TOPAMAX  Take 100 mg by mouth at bedtime.           Follow-up Information   Follow up with [name], MD. (Follow up appt after recent hospitalization)    Specialty:  Internal Medicine   Contact information:   [address], [address] [place] [address] [phone]        The results of significant diagnostics from this hospitalization (including imaging, microbiology, ancillary and laboratory) are listed below for reference.    Significant Diagnostic Studies: No results found.  Microbiology: Recent Results (from the past 240 hour(s))  CLOSTRIDIUM DIFFICILE BY PCR     Status: Abnormal   Collection Time    10/11/13  1:26 AM      Result Value Ref Range Status   C difficile by pcr POSITIVE (*) NEGATIVE Final   Comment: CRITICAL RESULT CALLED TO, READ BACK BY AND VERIFIED WITH:     MINTON D.,RN 10/11/13 1032 BY JONESJ     Performed at [place] CULTURE     Status: None   Collection Time    10/11/13  1:26 AM      Result Value Ref Range Status   Specimen Description STOOL   Final   Special Requests Immunocompromised   Final   Culture     Final   Value: NO SUSPICIOUS COLONIES, CONTINUING TO HOLD     Performed at [facility]   Report Status PENDING   Incomplete     Labs: Basic Metabolic Panel:  Recent Labs Lab 10/10/13 1901 10/11/13 0437 10/15/13 0425  NA 141 142 141  K 3.6* 3.9 2.9*  CL 103 109 107  CO2 _0 GLUCOSE 86 87 113*   BUN 11 8 3*  CREATININE 0.28* 0.28* 0.33*  CALCIUM 9.8 9.3 8.7   Liver Function Tests:  Recent Labs Lab 10/10/13 1901  AST 26  ALT 25  ALKPHOS 100  BILITOT <0.2*  PROT 6.2  ALBUMIN 2.8*   No results found for this basename: LIPASE, AMYLASE,  in the last 168 hours No results found for this basename: AMMONIA,  in the last 168 hours CBC:  Recent Labs Lab 10/10/13 1901 10/11/13 0437 10/15/13 0425  WBC 3.9* 4.0 3.6*  HGB 12.8 11.5* 9.9*  HCT 40.0 36.5 29.7*  MCV 97.3 99.2 94.9  PLT 222 191 222   Cardiac Enzymes: No results found for this basename: CKTOTAL, CKMB, CKMBINDEX, TROPONINI,  in the last 168 hours BNP: BNP (last 3 results) No results found for this basename: PROBNP,  in the last 8760 hours CBG: No results found for this basename: GLUCAP,  in the last 168 hours  Time coordinating discharge: Over 30 minutes

## 2013-10-15 NOTE — Discharge Instructions (Signed)
Clostridium Difficile FAQs °What is Clostridium difficile infection?  °Clostridium difficile [pronounced Klo-STRID-ee-um dif-uh-SEEL], also known as "C. diff" [See-dif], is a germ that can cause diarrhea. Most cases of C. diff infection occur in patients taking antibiotics. The most common symptoms of a C. diff infection include: °· Watery diarrhea °· Fever °· Loss of appetite °· Nausea °· Belly pain and tenderness °Who is most likely to get C. diff infection? °The elderly and people with certain medical problems have the greatest chance of getting C. diff. C. diff spores can live outside the human body for a very long time and may be found on things in the environment such as bed linens, bed rails, bathroom fixtures, and medical equipment. C. diff infection can spread from person-to-person on contaminated equipment and on the hands of doctors, nurses, other healthcare providers and visitors. °Can C. diff infection be treated? °Yes, there are antibiotics that can be used to treat C. diff. In some severe cases, a person might have to have surgery to remove the infected part of the intestines. This surgery is needed in only 1 or 2 out of every 100 persons with C. diff. °What are some of the things that hospitals are doing to prevent C. diff infections? °To prevent C. diff infections, doctors, nurses, and other healthcare providers: °· Clean their hands with soap and water or an alcohol-based hand rub before and after caring for every patient. This can prevent C. diff and other germs from being passed from one patient to another on their hands. °· Carefully clean hospital rooms and medical equipment that have been used for patients with C. diff. °· Use Contact Precautions to prevent C. diff from spreading to other patients. Contact Precautions mean: °¨ Whenever possible, patients with C. diff will have a single room or share a room only with someone else who also has C. diff. °¨ Healthcare providers will put on gloves  and wear a gown over their clothing while taking care of patients with C. diff. °¨ Visitors may also be asked to wear a gown and gloves. °¨ When leaving the room, hospital providers and visitors remove their gown and gloves and clean their hands. °¨ Patients on Contact Precautions are asked to stay in their hospital rooms as much as possible. They should not go to common areas, such as the gift shop or cafeteria. They can go to other areas of the hospital for treatments and tests. °· Only give patients antibiotics when it is necessary. °What can I do to help prevent C. diff infections? °· Make sure that all doctors, nurses, and other healthcare providers clean their hands with soap and water or an alcohol-based hand rub before and after caring for you. °· If you do not see your providers clean their hands, please ask them to do so. °· Only take antibiotics as prescribed by your doctor. °· Be sure to clean your own hands often, especially after using the bathroom and before eating. °Can my friends and family get C. diff when they visit me? °C. diff infection usually does not occur in persons who are not taking antibiotics. Visitors are not likely to get C. diff. Still, to make it safer for visitors, they should: °· Clean their hands before they enter your room and as they leave your room °· Ask the nurse if they need to wear protective gowns and gloves when they visit you. °What do I need to do when I go home from the hospital? °Once you   are back at home, you can return to your normal routine. Often, the diarrhea will be better or completely gone before you go home. This makes giving C. diff to other people much less likely. There are a few things you should do, however, to lower the chances of developing C. diff infection again or of spreading it to others.  If you are given a prescription to treat C. diff, take the medicine exactly as prescribed by your doctor and pharmacist. Do not take half-doses or stop before  you run out.  Wash your hands often, especially after going to the bathroom and before preparing food.  People who live with you should wash their hands often as well.  If you develop more diarrhea after you get home, tell your doctor immediately.  Your doctor may give you additional instructions. If you have questions, please ask your doctor or nurse. Developed and co-sponsored by [employer] for [place] [phone]); Infectious Diseases Society of [place] (IDSA); [place]; Association for Professionals in Infection Control and Epidemiology (APIC); Centers for Disease Control and Prevention (CDC); and [employer]. Document Released: 02/04/2013 Document Reviewed: 02/04/2013 [facility] Patient Information 2015 [place], [place]. This information is not intended to replace advice given to you by your health care provider. Make sure you discuss any questions you have with your health care provider. Clostridium Difficile Infection Clostridium difficile (C. difficile) is a bacteria found in the intestinal tract or colon. Under certain conditions, it causes diarrhea and sometimes severe disease. The severe form of the disease is known as pseudomembranous colitis (often called C. difficile colitis). This disease can damage the lining of the colon or cause the colon to become enlarged (toxic megacolon). CAUSES Your colon normally contains many different bacteria, including C. difficile. The balance of bacteria in your colon can change during illness. This is especially true when you take antibiotic medicine. Taking antibiotics may allow the C. difficile to grow, multiply excessively, and make a toxin that then causes illness. The elderly and people with certain medical conditions have a greater risk of getting C. difficile infections. SYMPTOMS  Watery diarrhea.  Fever.  Fatigue.  Loss of appetite.  Nausea.  Abdominal swelling, pain, or  tenderness.  Dehydration. DIAGNOSIS Your symptoms may make your caregiver suspect a C. difficile infection, especially if you have used antibiotics in the preceding weeks. However, there are only 2 ways to know for certain whether you have a C. difficile infection:  A lab test that finds the toxin in your stool.  The specific appearance of an abnormality (pseudomembrane) in your colon. This can only be seen by doing a sigmoidoscopy or colonoscopy. These procedures involve passing an instrument through your rectum to look at the inside of your colon. Your caregiver will help determine if these tests are necessary. TREATMENT  Most people are successfully treated with one of two specific antibiotics, usually given by mouth. Other antibiotics you are receiving are stopped if possible.  Intravenous (IV) fluids and correction of electrolyte imbalance may be necessary.  Rarely, surgery may be needed to remove the infected part of the intestines.  Careful hand washing by you and your caregivers is important to prevent the spread of infection. In the hospital, your caregivers may also put on gowns and gloves to prevent the spread of the C. difficile bacteria. Your room is also cleaned regularly with a solution containing bleach or a product that is known to kill C. difficile. HOME CARE INSTRUCTIONS  Drink enough fluids to keep your urine clear or pale yellow. Avoid milk, caffeine, and alcohol.  Ask your caregiver for specific rehydration instructions.  Try eating small, frequent meals rather than large meals.  Take your antibiotics as directed. Finish them even if you start to feel better.  Do not use medicines to slow diarrhea. This could delay healing or cause complications.  Wash your hands thoroughly after using the bathroom and before preparing food.  Make sure people who live with you wash their hands often, too.  Carefully disinfect all surfaces with a product that contains chlorine  bleach. SEEK MEDICAL CARE IF:  Diarrhea persists longer than expected or recurs after completing your course of antibiotic treatment for the C. difficile infection.  You have trouble staying hydrated. SEEK IMMEDIATE MEDICAL CARE IF:  You develop a new fever.  You have increasing abdominal pain or tenderness.  There is blood in your stools, or your stools are dark black and tarry.  You cannot hold down food or liquids. MAKE SURE YOU:  Understand these instructions.  Will watch your condition.  Will get help right away if you are not doing well or get worse. Document Released: 11/09/2004 Document Revised: 06/16/2013 Document Reviewed: 07/08/2010 [facility] Patient Information 2015 [place], [place]. This information is not intended to replace advice given to you by your health care provider. Make sure you discuss any questions you have with your health care provider.

## 2013-10-15 NOTE — Progress Notes (Signed)
CRITICAL VALUE ALERT  Critical value received:  Potassium 2.9   Date of notification:  10/15/13  Time of notification:  0510  Critical value read back:Yes.    Nurse who received alert:  Edwyna Ready   Notified (1st page):  Schorr  Time of first page:  [phone]

## 2013-10-17 ENCOUNTER — Encounter: Payer: Self-pay | Admitting: *Deleted

## 2013-10-17 NOTE — Progress Notes (Addendum)
NOTIFIED BIOLOGICS THAT PT. HAS BEEN IN THE HOSPITAL. DO NOT REFILL TYKERB AT THIS TIME.

## 2013-10-19 ENCOUNTER — Encounter (HOSPITAL_COMMUNITY): Payer: Self-pay | Admitting: Emergency Medicine

## 2013-10-19 ENCOUNTER — Inpatient Hospital Stay (HOSPITAL_COMMUNITY)
Admission: EM | Admit: 2013-10-19 | Discharge: 2013-10-21 | DRG: 371 | Disposition: A | Discharge status: Home / Self Care | Payer: Medicare Other | Attending: Internal Medicine | Admitting: Internal Medicine

## 2013-10-19 DIAGNOSIS — R197 Diarrhea, unspecified: Secondary | ICD-10-CM | POA: Diagnosis not present

## 2013-10-19 DIAGNOSIS — G43909 Migraine, unspecified, not intractable, without status migrainosus: Secondary | ICD-10-CM | POA: Diagnosis present

## 2013-10-19 DIAGNOSIS — Z79899 Other long term (current) drug therapy: Secondary | ICD-10-CM | POA: Diagnosis not present

## 2013-10-19 DIAGNOSIS — E860 Dehydration: Secondary | ICD-10-CM

## 2013-10-19 DIAGNOSIS — E43 Unspecified severe protein-calorie malnutrition: Secondary | ICD-10-CM | POA: Diagnosis present

## 2013-10-19 DIAGNOSIS — Z681 Body mass index (BMI) 19 or less, adult: Secondary | ICD-10-CM | POA: Diagnosis not present

## 2013-10-19 DIAGNOSIS — F17200 Nicotine dependence, unspecified, uncomplicated: Secondary | ICD-10-CM | POA: Diagnosis present

## 2013-10-19 DIAGNOSIS — N390 Urinary tract infection, site not specified: Secondary | ICD-10-CM | POA: Diagnosis present

## 2013-10-19 DIAGNOSIS — C50919 Malignant neoplasm of unspecified site of unspecified female breast: Secondary | ICD-10-CM | POA: Diagnosis present

## 2013-10-19 DIAGNOSIS — Z170 Estrogen receptor positive status [ER+]: Secondary | ICD-10-CM | POA: Diagnosis not present

## 2013-10-19 DIAGNOSIS — Z7401 Bed confinement status: Secondary | ICD-10-CM | POA: Diagnosis not present

## 2013-10-19 DIAGNOSIS — F3289 Other specified depressive episodes: Secondary | ICD-10-CM | POA: Diagnosis present

## 2013-10-19 DIAGNOSIS — E039 Hypothyroidism, unspecified: Secondary | ICD-10-CM | POA: Diagnosis present

## 2013-10-19 DIAGNOSIS — C7952 Secondary malignant neoplasm of bone marrow: Secondary | ICD-10-CM

## 2013-10-19 DIAGNOSIS — E785 Hyperlipidemia, unspecified: Secondary | ICD-10-CM | POA: Diagnosis present

## 2013-10-19 DIAGNOSIS — Z86711 Personal history of pulmonary embolism: Secondary | ICD-10-CM | POA: Diagnosis not present

## 2013-10-19 DIAGNOSIS — C7951 Secondary malignant neoplasm of bone: Secondary | ICD-10-CM | POA: Diagnosis present

## 2013-10-19 DIAGNOSIS — R636 Underweight: Secondary | ICD-10-CM | POA: Diagnosis present

## 2013-10-19 DIAGNOSIS — A0472 Enterocolitis due to Clostridium difficile, not specified as recurrent: Secondary | ICD-10-CM | POA: Diagnosis not present

## 2013-10-19 DIAGNOSIS — F329 Major depressive disorder, single episode, unspecified: Secondary | ICD-10-CM | POA: Diagnosis present

## 2013-10-19 HISTORY — DX: Enterocolitis due to Clostridium difficile, not specified as recurrent: A04.72

## 2013-10-19 LAB — BASIC METABOLIC PANEL
ANION GAP: 13 (ref 5–15)
BUN: 5 mg/dL — ABNORMAL LOW (ref 6–23)
CO2: 21 mEq/L (ref 19–32)
Calcium: 10 mg/dL (ref 8.4–10.5)
Chloride: 109 mEq/L (ref 96–112)
Creatinine, Ser: 0.32 mg/dL — ABNORMAL LOW (ref 0.50–1.10)
GFR calc Af Amer: >90 mL/min (ref >90)
Glucose, Bld: 86 mg/dL (ref 70–99)
POTASSIUM: 4 meq/L (ref 3.7–5.3)
SODIUM: 143 meq/L (ref 137–147)

## 2013-10-19 LAB — CBC
HCT: 37.2 % (ref 36.0–46.0)
Hemoglobin: 12.1 g/dL (ref 12.0–15.0)
MCH: 31.5 pg (ref 26.0–34.0)
MCHC: 32.5 g/dL (ref 30.0–36.0)
MCV: 96.9 fL (ref 78.0–100.0)
Platelets: 160 10*3/uL (ref 150–400)
RBC: 3.84 MIL/uL — AB (ref 3.87–5.11)
RDW: 19.2 % — ABNORMAL HIGH (ref 11.5–15.5)
WBC: 2.7 10*3/uL — ABNORMAL LOW (ref 4.0–10.5)

## 2013-10-19 MED ORDER — POTASSIUM CHLORIDE 20 MEQ PO PACK
20.0000 meq | PACK | Freq: Every day | ORAL | Status: DC
  Filled 2013-10-19: qty 1

## 2013-10-19 MED ORDER — SODIUM CHLORIDE 0.9 % IV BOLUS (SEPSIS)
2000.0000 mL | Freq: Once | INTRAVENOUS | Status: AC
  Administered 2013-10-19: 2000 mL via INTRAVENOUS

## 2013-10-19 MED ORDER — SUCRALFATE 1 G PO TABS
1.0000 g | ORAL_TABLET | Freq: Three times a day (TID) | ORAL | Status: DC
  Administered 2013-10-19 – 2013-10-21 (×7): 1 g via ORAL
  Filled 2013-10-19 (×10): qty 1

## 2013-10-19 MED ORDER — TIZANIDINE HCL 4 MG PO TABS
8.0000 mg | ORAL_TABLET | Freq: Three times a day (TID) | ORAL | Status: DC
  Administered 2013-10-19 – 2013-10-21 (×6): 8 mg via ORAL
  Filled 2013-10-19 (×7): qty 2

## 2013-10-19 MED ORDER — CETYLPYRIDINIUM CHLORIDE 0.05 % MT LIQD
7.0000 mL | Freq: Two times a day (BID) | OROMUCOSAL | Status: DC
  Administered 2013-10-20 – 2013-10-21 (×4): 7 mL via OROMUCOSAL

## 2013-10-19 MED ORDER — VANCOMYCIN 50 MG/ML ORAL SOLUTION
125.0000 mg | Freq: Four times a day (QID) | ORAL | Status: DC
  Administered 2013-10-19: 125 mg via ORAL
  Filled 2013-10-19 (×2): qty 2.5

## 2013-10-19 MED ORDER — METRONIDAZOLE IN NACL 5-0.79 MG/ML-% IV SOLN
500.0000 mg | Freq: Three times a day (TID) | INTRAVENOUS | Status: DC
  Administered 2013-10-19 – 2013-10-21 (×5): 500 mg via INTRAVENOUS
  Filled 2013-10-19 (×8): qty 100

## 2013-10-19 MED ORDER — ADULT MULTIVITAMIN W/MINERALS CH
1.0000 | ORAL_TABLET | Freq: Every day | ORAL | Status: DC
  Administered 2013-10-19 – 2013-10-21 (×3): 1 via ORAL
  Filled 2013-10-19 (×3): qty 1

## 2013-10-19 MED ORDER — METRONIDAZOLE 500 MG PO TABS: 2000.0000 mg | ORAL_TABLET | Freq: Once | ORAL | Status: DC

## 2013-10-19 MED ORDER — LEVOTHYROXINE SODIUM 75 MCG PO TABS
75.0000 ug | ORAL_TABLET | ORAL | Status: DC
  Administered 2013-10-21: 75 ug via ORAL
  Filled 2013-10-19 (×2): qty 1

## 2013-10-19 MED ORDER — DULOXETINE HCL 60 MG PO CPEP
60.0000 mg | ORAL_CAPSULE | Freq: Every day | ORAL | Status: DC
  Administered 2013-10-20 – 2013-10-21 (×2): 60 mg via ORAL
  Filled 2013-10-19 (×2): qty 1

## 2013-10-19 MED ORDER — HYDROMORPHONE HCL PF 1 MG/ML IJ SOLN
1.0000 mg | INTRAMUSCULAR | Status: DC | PRN
  Administered 2013-10-19 – 2013-10-21 (×11): 1 mg via INTRAVENOUS
  Filled 2013-10-19 (×11): qty 1

## 2013-10-19 MED ORDER — OXYCODONE-ACETAMINOPHEN 5-325 MG PO TABS
1.0000 | ORAL_TABLET | Freq: Once | ORAL | Status: AC
  Administered 2013-10-19: 1 via ORAL
  Filled 2013-10-19: qty 1

## 2013-10-19 MED ORDER — ENOXAPARIN SODIUM 60 MG/0.6ML ~~LOC~~ SOLN
60.0000 mg | Freq: Two times a day (BID) | SUBCUTANEOUS | Status: DC
  Administered 2013-10-19 – 2013-10-21 (×4): 60 mg via SUBCUTANEOUS
  Filled 2013-10-19 (×5): qty 0.6

## 2013-10-19 MED ORDER — SODIUM CHLORIDE 0.9 % IV SOLN
INTRAVENOUS | Status: AC
  Administered 2013-10-19 – 2013-10-20 (×2): via INTRAVENOUS

## 2013-10-19 MED ORDER — DIAZEPAM 5 MG PO TABS
10.0000 mg | ORAL_TABLET | Freq: Once | ORAL | Status: AC
  Administered 2013-10-19: 10 mg via ORAL
  Filled 2013-10-19: qty 2

## 2013-10-19 MED ORDER — TOPIRAMATE 100 MG PO TABS
100.0000 mg | ORAL_TABLET | Freq: Every day | ORAL | Status: DC
  Administered 2013-10-19 – 2013-10-20 (×2): 100 mg via ORAL
  Filled 2013-10-19 (×3): qty 1

## 2013-10-19 MED ORDER — DIAZEPAM 5 MG PO TABS
10.0000 mg | ORAL_TABLET | Freq: Four times a day (QID) | ORAL | Status: DC | PRN
  Administered 2013-10-19 – 2013-10-21 (×6): 10 mg via ORAL
  Filled 2013-10-19 (×6): qty 2

## 2013-10-19 MED ORDER — ONDANSETRON HCL 4 MG PO TABS: 4.0000 mg | ORAL_TABLET | Freq: Four times a day (QID) | ORAL | Status: DC | PRN

## 2013-10-19 MED ORDER — PANTOPRAZOLE SODIUM 40 MG PO TBEC
40.0000 mg | DELAYED_RELEASE_TABLET | Freq: Every day | ORAL | Status: DC
  Administered 2013-10-19 – 2013-10-20 (×2): 40 mg via ORAL
  Filled 2013-10-19 (×2): qty 1

## 2013-10-19 MED ORDER — CHLORHEXIDINE GLUCONATE 0.12 % MT SOLN
15.0000 mL | Freq: Two times a day (BID) | OROMUCOSAL | Status: DC
  Administered 2013-10-19 – 2013-10-21 (×3): 15 mL via OROMUCOSAL
  Filled 2013-10-19 (×6): qty 15

## 2013-10-19 MED ORDER — ONDANSETRON HCL 4 MG/2ML IJ SOLN: 4.0000 mg | Freq: Four times a day (QID) | INTRAMUSCULAR | Status: DC | PRN

## 2013-10-19 MED ORDER — LAPATINIB DITOSYLATE 250 MG PO TABS: 1250.0000 mg | ORAL_TABLET | Freq: Every day | ORAL | Status: DC

## 2013-10-19 NOTE — Progress Notes (Signed)
Lapatinib (Tykerb) . AST or ALT > 3x ULN . Bili > 1.5x ULN . Diarrhea - Grade 2 or higher* . New or worsened CHF . Unexplained pneumonitis / hypoxemia  Due to patient having diarrhea, Tykerb has been held.

## 2013-10-19 NOTE — ED Notes (Signed)
Per [facility]- Pt is a Cancer pt on chemo. DX with C-DIFF last week. Started on multiple antibiotics with some improvement however remains with continuous diarrhea and stomach cramping. Here for re evaluation

## 2013-10-19 NOTE — H&P (Signed)
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 difficile enteritis, discharge and Flagyl. Patient explains she was initially doing well and 2 days after being discharged she started to develop worsening diarrhea. She noticed it happen after drinking chicken broth. Patient called primary care doctor and said she was too sick to visit him and the doctor called in prescription for oral vancomycin. Patient reports taking 12 doses so far with no improvement in symptoms. She describes diarrhea as watery, 7 episodes since arriving to emergency department, associated with abdominal cramps and with no specific alleviating symptoms. She also reports weight loss or 5 pounds since being discharged. Patient denies fevers and chills, no known sick contacts or exposures, other than that drinking broth she has not eaten much over the past 5 days.  In the emergency department, patient noted to be in mild distress due to to abdominal cramping. Vital signs stable and within target range. White blood cell count noted to be slightly low at 2.7 otherwise CBC unremarkable, electrolyte panel also unremarkable and within normal limits. [facility] asked to admit to medical bed for further evaluation.  Assessment/Plan:   Principal Problem:  Abdominal discomfort with diarrhea - Recently treated for C. difficile and treated with Flagyl, transition to oral vancomycin by PCP - Will repeat C. 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   Code Status: Full Family Communication: Plan of care, management, discussed with patient and daughter at bedside. Disposition Plan: Admit for further evaluation     Review of Systems:  Constitutional: Negative for fever, chills and malaise/fatigue. Negative for diaphoresis.  HENT: Negative for hearing loss, ear pain, nosebleeds, congestion, sore throat, neck pain, tinnitus and ear discharge.   Eyes: Negative for blurred vision, double vision, photophobia, pain, discharge and redness.  Respiratory: Negative for cough, hemoptysis, sputum production, shortness of breath, wheezing and stridor.   Cardiovascular: Negative for chest pain, palpitations, orthopnea, claudication and leg swelling.  Gastrointestinal: Per history of present illness Genitourinary: Negative for dysuria, urgency, frequency, hematuria and flank pain.  Musculoskeletal: Negative for myalgias, back pain, joint pain and falls.  Skin: Negative for itching and rash.  Neurological: Negative for dizziness and weakness.  Endo/Heme/Allergies: Negative for environmental allergies and polydipsia. Does not bruise/bleed easily.   Psychiatric/Behavioral: Negative for suicidal ideas. The patient is not nervous/anxious.      Past Medical History  Diagnosis Date  . Central hypothyroidism   . Hyperlipidemia   . Spinal cord injury   . Depression   . Cancer     spinal metastasis  . PONV (postoperative nausea and vomiting)   . Pulmonary embolism 08/2013  . C. difficile diarrhea     Past Surgical History  Procedure Laterality Date  . Laminectomy    . Abdominal hysterectomy      Social History:  reports that she has been smoking Cigarettes.  She has a 40 pack-year smoking history. She has never used smokeless tobacco. She reports that she does not drink alcohol or use illicit drugs.  Allergies  Allergen Reactions  . Morphine And Related Nausea And Vomiting    No family history on file.  Prior to Admission medications   Medication Sig Start Date End Date Taking? Authorizing Provider  diazepam (VALIUM) 10 MG tablet Take 10 mg by mouth every 6 (six) hours as needed. For muscle spasms 07/10/13  Yes Elwyn Lade, PA-C  DULoxetine (CYMBALTA) 60 MG capsule Take 60 mg by mouth daily.   Yes Historical Provider, MD  enoxaparin (LOVENOX) 60 MG/0.6ML injection Inject 0.6 mLs (60 mg total) into the skin every 12 (twelve) hours. 09/05/13  Yes [name], MD  esomeprazole (NEXIUM) 20 MG capsule Take 20 mg by mouth daily at 12 noon.   Yes Historical Provider, MD  lapatinib (TYKERB) 250 MG tablet Take 5 tablets (1,250 mg total) by mouth daily. 09/15/13  Yes Concha Norway, MD  letrozole [facility]) 2.5 MG tablet Take 1 tablet (2.5 mg total) by mouth daily. 09/05/13  Yes Concha Norway, MD  levothyroxine (SYNTHROID, LEVOTHROID) 75 MCG tablet Take 75 mcg by mouth See admin instructions. Takes daily except Monday and Thursday   Yes Historical Provider, MD  Multiple Vitamin (MULTIVITAMIN WITH MINERALS) TABS tablet Take 1 tablet by mouth daily.   Yes Historical Provider, MD  Omega-3 Fatty Acids (OMEGA 3 PO) Take 1 capsule by mouth  daily.   Yes Historical Provider, MD  oxyCODONE-acetaminophen (PERCOCET/ROXICET) 5-325 MG per tablet Take 1-2 tablets by mouth every 4 (four) hours as needed for moderate pain or severe pain.   Yes Historical Provider, MD  potassium chloride (KLOR-CON) 20 MEQ packet Take 20 mEq by mouth daily. 10/15/13  Yes Robbie Lis, MD  rizatriptan (MAXALT) 10 MG tablet Take 10 mg by mouth as needed for migraine. May repeat in 2 hours if needed   Yes Historical Provider, MD  sucralfate (CARAFATE) 1 G tablet Take 1 g by mouth 4 (four) times daily -  with meals and at bedtime.   Yes Historical Provider, MD  tiZANidine (ZANAFLEX) 4 MG tablet Take 8 mg by mouth 3 (three) times daily.   Yes Historical Provider, MD  topiramate (TOPAMAX) 100 MG tablet Take 100 mg by mouth at bedtime.   Yes Historical Provider, MD  vancomycin (VANCOCIN) 125 MG capsule Take 125 mg by mouth 4 (four) times daily.   Yes Historical Provider, MD    Physical Exam: Filed Vitals:   10/19/13 1540  BP: 117/75  Pulse: 84  Temp: 98.6 F (37 C)  TempSrc: Oral  Resp: 16  Height: _0  (1.727 m)  Weight: 54.432 kg (120 lb)  SpO2: 100%    Physical Exam  Constitutional: Appears well-developed and well-nourished. No distress.  HENT: Normocephalic. External right and left ear normal. Dry mucous membranes Eyes: Conjunctivae and EOM are normal. PERRLA, no scleral icterus.  Neck: Normal ROM. Neck supple. No  JVD. No tracheal deviation. No thyromegaly.  CVS: RRR, S1/S2 +, no murmurs, no gallops, no carotid bruit.  Pulmonary: Effort and breath sounds normal, no stridor, rhonchi, wheezes, rales.  Abdominal: Soft. BS +,  no distension, tenderness in epigastric care, no rebound or guarding.  Musculoskeletal: Normal range of motion. No edema and no tenderness.  Lymphadenopathy: No lymphadenopathy noted, cervical, inguinal. Neuro: Alert. Normal reflexes, muscle tone coordination. No cranial nerve deficit. Skin: Skin is warm and dry. No rash noted.  Not diaphoretic. No erythema. No pallor.  Psychiatric: Normal mood and affect. Behavior, judgment, thought content normal.   Labs on Admission:  Basic Metabolic Panel:  Recent Labs Lab 10/15/13 0425 10/19/13 1608  NA 141 143  K 2.9* 4.0  CL 107 109  CO2 24 21  GLUCOSE 113* 86  BUN 3* 5*  CREATININE 0.33* 0.32*  CALCIUM 8.7 10.0   Liver Function Tests: No results found for this basename: AST, ALT, ALKPHOS, BILITOT, PROT, ALBUMIN,  in the last 168 hours No results found for this basename: LIPASE, AMYLASE,  in the last 168 hours No results found for this basename: AMMONIA,  in the last 168 hours CBC:  Recent Labs Lab 10/15/13 0425 10/19/13 1608  WBC 3.6* 2.7*  HGB 9.9* 12.1  HCT 29.7* 37.2  MCV 94.9 96.9  PLT 222 160   Cardiac Enzymes: No results found for this basename: CKTOTAL, CKMB, CKMBINDEX, TROPONINI,  in the last 168 hours BNP: No components found with this basename: POCBNP,  CBG: No results found for this basename: GLUCAP,  in the last 168 hours  EKG: Normal sinus rhythm, no ST/T wave changes  Faye Ramsay, MD  [facility] Pager [phone]  If 7PM-7AM, please contact night-coverage [email or web address] Password [facility] 10/19/2013, 6:33 PM

## 2013-10-19 NOTE — ED Provider Notes (Signed)
CSN: [id number]     Arrival date & time 10/19/13  1409 History   First MD Initiated Contact with Patient 10/19/13 1502     Chief Complaint  Patient presents with  . Diarrhea  . Abdominal Pain  . Breast Cancer     (Consider location/radiation/quality/duration/timing/severity/associated sxs/prior Treatment) The history is provided by the patient and medical records. No language interpreter was used.    Kristie Jackson is a 58 y.o. female with past medical history of breast cancer with bone metastases (diagnosed in 08/2013), started Femara 09/05/2013, status post palliative radiotherapy to thoracic and lumbosacral spine and right hip (09/19/2013), started lapatanib 09/12/2013, presents to the emergency department c/o persistent diarrhea and stomach cramping.  Record review shows that pt was seen in the [place] ED on 10/10/2013 with profuse diarrhea which started shortly after starting the lapatanib. She was subsequently admitted and found to have C.diff treated with flagyl. She was discharged on 10/15/13 and has been at home since that time.  Pt was also taking Tykerb, but this was stopped during the last admission.    Pt reports she saw her MD 3 days ago when the diarrhea increased and she was switched from Flagyl to Oral Vancomycin, but diarrhea has continued to worsen and she is now defecating large amounts of watery diarrhea approx every hour.  She reports continued clear liquid and fluid intake, but solid foods increases the diarrhea.  Pt reports associated mild nausea, 40lb weight loss in the last 4 weeks and fatigue.  Pt denies fevers or focal abd pain.  No known sick contacts.     Past Medical History  Diagnosis Date  . Central hypothyroidism   . Hyperlipidemia   . Spinal cord injury   . Depression   . Cancer     spinal metastasis  . PONV (postoperative nausea and vomiting)   . Pulmonary embolism 08/2013  . C. difficile diarrhea    Past Surgical History  Procedure Laterality Date  .  Laminectomy    . Abdominal hysterectomy     No family history on file. History  Substance Use Topics  . Smoking status: Current Every Day Smoker -- 1.00 packs/day for 40 years    Types: Cigarettes  . Smokeless tobacco: Never Used  . Alcohol Use: No   OB History   Grav Para Term Preterm Abortions TAB SAB Ect Mult Living                 Review of Systems  Constitutional: Positive for unexpected weight change (40lbs in last month). Negative for fever, diaphoresis, appetite change and fatigue.  HENT: Negative for mouth sores.   Eyes: Negative for visual disturbance.  Respiratory: Negative for cough, chest tightness, shortness of breath and wheezing.   Cardiovascular: Negative for chest pain.  Gastrointestinal: Positive for abdominal pain (cramping) and diarrhea. Negative for nausea, vomiting and constipation.  Endocrine: Negative for polydipsia, polyphagia and polyuria.  Genitourinary: Negative for dysuria, urgency, frequency and hematuria.  Musculoskeletal: Negative for back pain and neck stiffness.  Skin: Negative for rash.  Allergic/Immunologic: Positive for immunocompromised state.  Neurological: Negative for syncope, light-headedness and headaches.  Hematological: Does not bruise/bleed easily.  Psychiatric/Behavioral: Negative for sleep disturbance. The patient is not nervous/anxious.       Allergies  Morphine and related  Home Medications   Prior to Admission medications   Medication Sig Start Date End Date Taking? Authorizing Provider  diazepam (VALIUM) 10 MG tablet Take 10 mg by mouth every 6 (six)  hours as needed. For muscle spasms 07/10/13  Yes Elwyn Lade, PA-C  DULoxetine (CYMBALTA) 60 MG capsule Take 60 mg by mouth daily.   Yes Historical Provider, MD  enoxaparin (LOVENOX) 60 MG/0.6ML injection Inject 0.6 mLs (60 mg total) into the skin every 12 (twelve) hours. 09/05/13  Yes [name], MD  esomeprazole (NEXIUM) 20 MG capsule Take 20 mg by mouth daily  at 12 noon.   Yes Historical Provider, MD  lapatinib (TYKERB) 250 MG tablet Take 5 tablets (1,250 mg total) by mouth daily. 09/15/13  Yes Concha Norway, MD  letrozole [facility]) 2.5 MG tablet Take 1 tablet (2.5 mg total) by mouth daily. 09/05/13  Yes Concha Norway, MD  levothyroxine (SYNTHROID, LEVOTHROID) 75 MCG tablet Take 75 mcg by mouth See admin instructions. Takes daily except Monday and Thursday   Yes Historical Provider, MD  Multiple Vitamin (MULTIVITAMIN WITH MINERALS) TABS tablet Take 1 tablet by mouth daily.   Yes Historical Provider, MD  Omega-3 Fatty Acids (OMEGA 3 PO) Take 1 capsule by mouth daily.   Yes Historical Provider, MD  oxyCODONE-acetaminophen (PERCOCET/ROXICET) 5-325 MG per tablet Take 1-2 tablets by mouth every 4 (four) hours as needed for moderate pain or severe pain.   Yes Historical Provider, MD  potassium chloride (KLOR-CON) 20 MEQ packet Take 20 mEq by mouth daily. 10/15/13  Yes Robbie Lis, MD  rizatriptan (MAXALT) 10 MG tablet Take 10 mg by mouth as needed for migraine. May repeat in 2 hours if needed   Yes Historical Provider, MD  sucralfate (CARAFATE) 1 G tablet Take 1 g by mouth 4 (four) times daily -  with meals and at bedtime.   Yes Historical Provider, MD  tiZANidine (ZANAFLEX) 4 MG tablet Take 8 mg by mouth 3 (three) times daily.   Yes Historical Provider, MD  topiramate (TOPAMAX) 100 MG tablet Take 100 mg by mouth at bedtime.   Yes Historical Provider, MD  vancomycin (VANCOCIN) 125 MG capsule Take 125 mg by mouth 4 (four) times daily.   Yes Historical Provider, MD   BP 112/65  Pulse 80  Temp(Src) 98.4 F (36.9 C) (Oral)  Resp 18  Ht 5\' 8"  (1.727 m)  Wt 120 lb (54.432 kg)  BMI 18.25 kg/m2  SpO2 100% Physical Exam  Nursing note and vitals reviewed. Constitutional: She appears well-developed and well-nourished. No distress.  Awake, alert, nontoxic appearance Cachectic  HENT:  Head: Normocephalic and atraumatic.  Mouth/Throat: Oropharynx is clear and moist. No  oropharyngeal exudate.  Eyes: Conjunctivae are normal. No scleral icterus.  Neck: Normal range of motion. Neck supple.  Cardiovascular: Normal rate, regular rhythm, normal heart sounds and intact distal pulses.   No murmur heard. Pulmonary/Chest: Effort normal and breath sounds normal. No respiratory distress. She has no wheezes.  Equal chest expansion  Abdominal: Soft. Bowel sounds are normal. She exhibits no distension and no mass. There is no tenderness. There is no rebound and no guarding.  abd soft and nontender   Genitourinary:  Yellow, foul smelling diarrhea noted on bedding change  Musculoskeletal: Normal range of motion. She exhibits no edema.  Neurological: She is alert.  Speech is clear and goal oriented Moves extremities without ataxia  Skin: Skin is warm and dry. She is not diaphoretic.  Psychiatric: She has a normal mood and affect.    ED Course  Procedures (including critical care time) Labs Review Labs Reviewed  CBC - Abnormal; Notable for the following:    WBC 2.7 (*)  RBC 3.84 (*)    RDW 19.2 (*)    All other components within normal limits  BASIC METABOLIC PANEL - Abnormal; Notable for the following:    BUN 5 (*)    Creatinine, Ser 0.32 (*)    All other components within normal limits  URINALYSIS, ROUTINE W REFLEX MICROSCOPIC    Imaging Review No results found.   EKG Interpretation None      MDM   Final diagnoses:  Dehydration  Diarrhea  C. difficile diarrhea  Breast cancer metastasized to multiple sites, unspecified laterality   Kristie Jackson presents with worsening diarrhea 4 days after d/c from inpatient admission for c-diff.  C-diff likely from abx treatment in mid July 2/2 sepsis.  Pt has been taking flagyl and vancomycin, but appears to have failed outpatient treatment at this time.  Pt without tachycardia on exam. Anticipate admission for persistent c-diff.    7:04 PM Labs and vitals reassuring, but pt appears dehydrated with dry mucous  membranes.  Pt with persistent diarrhea in spite of outpatient treatments.  Discussed with Dr. Doyle Askew who will admit for rehydration and monitoring of her electrolytes.    The patient was discussed with and seen by Dr. Jeanell Sparrow who agrees with the treatment plan.  BP 112/65  Pulse 80  Temp(Src) 98.4 F (36.9 C) (Oral)  Resp 18  Ht 5\' 8"  (1.727 m)  Wt 120 lb (54.432 kg)  BMI 18.25 kg/m2  SpO2 100%    Abigail Butts, PA-C 10/19/13 1914

## 2013-10-19 NOTE — ED Provider Notes (Signed)
58 y.o. Female with recent diagnosis of breast cancer presents today with c.dificile diarrhea recent diagnosis.  She is being treated with oral vancomycin but continues to have profuse stooling.  PE WD ill appearing female Mucous membranes appear dry Abdomen is soft and mild diffuse ttp   I performed a history and physical examination of Kristie Jackson and discussed her management with Ms. Ballinger.  I agree with the history, physical, assessment, and plan of care, with the following exceptions: None  I was present for the following procedures: None Time Spent in Critical Care of the patient: None Time spent in discussions with the patient and family: [place], MD 10/19/13 2330

## 2013-10-19 NOTE — ED Notes (Signed)
MD at bedside. EDP RAY PRESENT TO EVALUATE THIS PT

## 2013-10-20 DIAGNOSIS — E039 Hypothyroidism, unspecified: Secondary | ICD-10-CM | POA: Diagnosis present

## 2013-10-20 DIAGNOSIS — R636 Underweight: Secondary | ICD-10-CM | POA: Diagnosis present

## 2013-10-20 DIAGNOSIS — E43 Unspecified severe protein-calorie malnutrition: Secondary | ICD-10-CM

## 2013-10-20 DIAGNOSIS — A0472 Enterocolitis due to Clostridium difficile, not specified as recurrent: Principal | ICD-10-CM

## 2013-10-20 LAB — CBC
HCT: 33.2 % — ABNORMAL LOW (ref 36.0–46.0)
HEMOGLOBIN: 10.6 g/dL — AB (ref 12.0–15.0)
MCH: 31.8 pg (ref 26.0–34.0)
MCHC: 31.9 g/dL (ref 30.0–36.0)
MCV: 99.7 fL (ref 78.0–100.0)
Platelets: 278 10*3/uL (ref 150–400)
RBC: 3.33 MIL/uL — ABNORMAL LOW (ref 3.87–5.11)
RDW: 19 % — ABNORMAL HIGH (ref 11.5–15.5)
WBC: 2.4 10*3/uL — ABNORMAL LOW (ref 4.0–10.5)

## 2013-10-20 LAB — COMPREHENSIVE METABOLIC PANEL
ALT: 16 U/L (ref 0–35)
AST: 29 U/L (ref 0–37)
Albumin: 2.2 g/dL — ABNORMAL LOW (ref 3.5–5.2)
Alkaline Phosphatase: 87 U/L (ref 39–117)
Anion gap: 14 (ref 5–15)
BUN: 4 mg/dL — ABNORMAL LOW (ref 6–23)
CO2: 19 meq/L (ref 19–32)
Calcium: 9.1 mg/dL (ref 8.4–10.5)
Chloride: 108 mEq/L (ref 96–112)
Creatinine, Ser: 0.36 mg/dL — ABNORMAL LOW (ref 0.50–1.10)
GFR calc Af Amer: >90 mL/min (ref >90)
GFR calc non Af Amer: >90 mL/min (ref >90)
Glucose, Bld: 82 mg/dL (ref 70–99)
POTASSIUM: 3.6 meq/L — AB (ref 3.7–5.3)
SODIUM: 141 meq/L (ref 137–147)
TOTAL PROTEIN: 5 g/dL — AB (ref 6.0–8.3)
Total Bilirubin: <0.2 mg/dL — ABNORMAL LOW (ref 0.3–1.2)

## 2013-10-20 MED ORDER — POTASSIUM CHLORIDE CRYS ER 20 MEQ PO TBCR
20.0000 meq | EXTENDED_RELEASE_TABLET | Freq: Every day | ORAL | Status: DC
  Administered 2013-10-20 – 2013-10-21 (×2): 20 meq via ORAL
  Filled 2013-10-20 (×2): qty 1

## 2013-10-20 MED ORDER — BOOST PLUS PO LIQD
237.0000 mL | Freq: Three times a day (TID) | ORAL | Status: DC
  Administered 2013-10-20 – 2013-10-21 (×3): 237 mL via ORAL
  Filled 2013-10-20 (×4): qty 237

## 2013-10-20 NOTE — Progress Notes (Signed)
PROGRESS NOTE  Kristie Jackson ZOX:[id number] DOB: June 05, 1955 DOA: 10/19/2013 PCP: [name], MD  HPI/Recap of past 23 hours: 58 year old white female with past medical history of metastatic breast cancer who had been recently discharged on 9/2 after being hospitalized for C. difficile colitis presented back to the emergency room on 9/7 after having continuous diarrhea with any kind of solid food following discharge. In the emergency room, her lab work was unremarkable with the exception of a slightly low white count, similar to previous. Patient was started on IV fluids plus Flagyl IV and admitted to the hospitalist service.  Overnight, no issues. Patient started to feel little bit better. Less abdominal cramping and pain. She is able to tolerate small bites of solid food that immediately going having diarrhea.  Assessment/Plan: Principal Problem:   Diarrhea: Likely same course of C. difficile. I do not see this as a recurrence or failure of therapy. Suspect some component may be due to continue PPI use. This medication has been discontinued. For now, she seems to be tolerating Flagyl and given her stability, no reason to start oral vancomycin. She is able to tolerate solid food, would change her over to by mouth Flagyl tomorrow. Active Problems:   Protein-calorie malnutrition, severe plus underweight: Patient meets criteria in the context of chronic illness. Seen by nutrition. Started on boost +3 times a day as she can tolerate    Breast cancer metastasized to multiple sites: Ongoing therapy.   Hypothyroidism (acquired): Continue Synthroid    Code Status: Full code  Family Communication: Daughter at the bedside  Disposition Plan: Home when able to tolerate solid food plus by mouth antibiotics. Likely tomorrow   Consultants:  None  Procedures:  None  Antibiotics:  IV Flagyl 9/6-present. Previously was on oral Flagyl at home  HPI/Subjective: Patient feeling better. Less  abdominal cramping and pain  Objective: BP 109/63  Pulse 93  Temp(Src) 97.4 F (36.3 C) (Oral)  Resp 16  Ht 5\' 8"  (1.727 m)  Wt 54.432 kg (120 lb)  BMI 18.25 kg/m2  SpO2 99%  Intake/Output Summary (Last 24 hours) at 10/20/13 1455 Last data filed at 10/20/13 1300  Gross per 24 hour  Intake    480 ml  Output      0 ml  Net    480 ml   Filed Weights   10/19/13 1540 10/20/13 0400  Weight: 54.432 kg (120 lb) 54.432 kg (120 lb)    Exam:   General:  Alert and oriented x3, no acute distress  Cardiovascular: Regular rate and rhythm, S1-S2  Respiratory: Clear to auscultation bilaterally  Abdomen: Soft, nontender, hyperactive bowel sounds, nondistended  Musculoskeletal: No clubbing or cyanosis or edema   Data Reviewed: Basic Metabolic Panel:  Recent Labs Lab 10/15/13 0425 10/19/13 1608 10/20/13 0435  NA 141 143 141  K 2.9* 4.0 3.6*  CL 107 109 108  CO2 24 21 19   GLUCOSE 113* 86 82  BUN 3* 5* 4*  CREATININE 0.33* 0.32* 0.36*  CALCIUM 8.7 10.0 9.1   Liver Function Tests:  Recent Labs Lab 10/20/13 0435  AST 29  ALT 16  ALKPHOS 87  BILITOT <0.2*  PROT 5.0*  ALBUMIN 2.2*   No results found for this basename: LIPASE, AMYLASE,  in the last 168 hours No results found for this basename: AMMONIA,  in the last 168 hours CBC:  Recent Labs Lab 10/15/13 0425 10/19/13 1608 10/20/13 0435  WBC 3.6* 2.7* 2.4*  HGB 9.9* 12.1 10.6*  HCT  29.7* 37.2 33.2*  MCV 94.9 96.9 99.7  PLT 222 160 278   Cardiac Enzymes:   No results found for this basename: CKTOTAL, CKMB, CKMBINDEX, TROPONINI,  in the last 168 hours BNP (last 3 results) No results found for this basename: PROBNP,  in the last 8760 hours CBG: No results found for this basename: GLUCAP,  in the last 168 hours  Recent Results (from the past 240 hour(s))  CLOSTRIDIUM DIFFICILE BY PCR     Status: Abnormal   Collection Time    10/11/13  1:26 AM      Result Value Ref Range Status   C difficile by pcr  POSITIVE (*) NEGATIVE Final   Comment: CRITICAL RESULT CALLED TO, READ BACK BY AND VERIFIED WITH:     MINTON D.,RN 10/11/13 1032 BY JONESJ     Performed at [facility] CULTURE     Status: None   Collection Time    10/11/13  1:26 AM      Result Value Ref Range Status   Specimen Description STOOL   Final   Special Requests Immunocompromised   Final   Culture     Final   Value: NO SALMONELLA, SHIGELLA, CAMPYLOBACTER, YERSINIA, OR E.COLI 0157:H7 ISOLATED     Performed at [facility]   Report Status 10/15/2013 FINAL   Final     Studies: No results found.  Scheduled Meds: . antiseptic oral rinse  7 mL Mouth Rinse q12n4p  . chlorhexidine  15 mL Mouth Rinse BID  . DULoxetine  60 mg Oral Daily  . enoxaparin  60 mg Subcutaneous Q12H  . lactose free nutrition  237 mL Oral TID WC  . [START ON 10/21/2013] levothyroxine  75 mcg Oral Once per day on Sun Tue Wed Fri Sat  . metronidazole  500 mg Intravenous Q8H  . multivitamin with minerals  1 tablet Oral Daily  . potassium chloride  20 mEq Oral Daily  . sucralfate  1 g Oral TID WC & HS  . tiZANidine  8 mg Oral TID  . topiramate  100 mg Oral QHS    Continuous Infusions:    Time spent: 15 minutes  [facility] Pager [phone]. If 7PM-7AM, please contact night-coverage at [email or web address], password [facility] 10/20/2013, 2:55 PM  LOS: 1 day

## 2013-10-20 NOTE — Progress Notes (Signed)
INITIAL NUTRITION ASSESSMENT  DOCUMENTATION CODES Per approved criteria  -Severe malnutrition in the context of chronic illness -Underweight   INTERVENTION: Consider probiotic Regular diet as tolerated Boost Plus tid as tolerated RD to follow.  NUTRITION DIAGNOSIS: Inadequate oral intake related to altered GI function as evidenced by severe diarrhea.   Goal: Diet tolerance with intake of meals and supplements to meet >90% estimated needs.  Monitor:  Intake, labs, weight trend, diet tolerance.  Reason for Assessment: MST  58 y.o. female  Admitting Dx: <principal problem not specified>  ASSESSMENT: Past medical hx of breast cancer with bone mets s/p palliative XRT and had been receiving chemo.  Treated for positive C.diff last admit.   Patient known to me from admit last week ago.  Has not eaten since discharge except for rice noodles in bullion broth which resulted in severe diarrhea.   -Drinks boost at home will continue here. -Ate a bagel for breakfast and has tolerated thus far. -Hx of severe malnutrition which continues.   -50-60 lb weight loss in the past 4-5 months with 20 lb loss in the past month.  Height: Ht Readings from Last 1 Encounters:  10/19/13 5\' 8"  (1.727 m)    Weight: Wt Readings from Last 1 Encounters:  10/20/13 120 lb (54.432 kg)    Ideal Body Weight: 140 lbs  % Ideal Body Weight: 85  Wt Readings from Last 10 Encounters:  10/20/13 120 lb (54.432 kg)  10/10/13 120 lb (54.432 kg)  09/05/13 137 lb 2 oz (62.2 kg)  07/09/13 150 lb (68.04 kg)    Usual Body Weight: about 180 lbs  % Usual Body Weight: 67  BMI:  Body mass index is 18.25 kg/(m^2).  Estimated Nutritional Needs: Kcal: 1800-2000 Protein: 90-100 gm Fluid: 1.8-2L daily  Skin: damage secondary to c.diff.  Question current status.  Diet Order: General  EDUCATION NEEDS: -Education needs addressed   Intake/Output Summary (Last 24 hours) at 10/20/13 1254 Last data filed at  10/20/13 0630  Gross per 24 hour  Intake    120 ml  Output      0 ml  Net    120 ml     Labs:   Recent Labs Lab 10/15/13 0425 10/19/13 1608 10/20/13 0435  NA 141 143 141  K 2.9* 4.0 3.6*  CL 107 109 108  CO2 24 21 19   BUN 3* 5* 4*  CREATININE 0.33* 0.32* 0.36*  CALCIUM 8.7 10.0 9.1  GLUCOSE 113* 86 82    CBG (last 3)  No results found for this basename: GLUCAP,  in the last 72 hours  Scheduled Meds: . antiseptic oral rinse  7 mL Mouth Rinse q12n4p  . chlorhexidine  15 mL Mouth Rinse BID  . DULoxetine  60 mg Oral Daily  . enoxaparin  60 mg Subcutaneous Q12H  . [START ON 10/21/2013] levothyroxine  75 mcg Oral Once per day on Sun Tue Wed Fri Sat  . metronidazole  500 mg Intravenous Q8H  . multivitamin with minerals  1 tablet Oral Daily  . pantoprazole  40 mg Oral Daily  . potassium chloride  20 mEq Oral Daily  . sucralfate  1 g Oral TID WC & HS  . tiZANidine  8 mg Oral TID  . topiramate  100 mg Oral QHS    Continuous Infusions:   Past Medical History  Diagnosis Date  . Central hypothyroidism   . Hyperlipidemia   . Spinal cord injury   . Depression   . Cancer  spinal metastasis  . PONV (postoperative nausea and vomiting)   . Pulmonary embolism 08/2013  . C. difficile diarrhea     Past Surgical History  Procedure Laterality Date  . Laminectomy    . Abdominal hysterectomy      Antonieta Iba, RD, LDN Clinical Inpatient Dietitian Pager:  [phone] Weekend and after hours pager:  [phone]

## 2013-10-21 ENCOUNTER — Other Ambulatory Visit: Payer: Self-pay | Admitting: *Deleted

## 2013-10-21 DIAGNOSIS — C50919 Malignant neoplasm of unspecified site of unspecified female breast: Secondary | ICD-10-CM

## 2013-10-21 DIAGNOSIS — C800 Disseminated malignant neoplasm, unspecified: Secondary | ICD-10-CM

## 2013-10-21 DIAGNOSIS — A0472 Enterocolitis due to Clostridium difficile, not specified as recurrent: Secondary | ICD-10-CM | POA: Diagnosis present

## 2013-10-21 LAB — URINALYSIS, ROUTINE W REFLEX MICROSCOPIC
Bilirubin Urine: NEGATIVE
GLUCOSE, UA: NEGATIVE mg/dL
Ketones, ur: NEGATIVE mg/dL
Nitrite: NEGATIVE
PROTEIN: NEGATIVE mg/dL
Specific Gravity, Urine: 1.007 (ref 1.005–1.030)
UROBILINOGEN UA: 0.2 mg/dL (ref 0.0–1.0)
pH: 5.5 (ref 5.0–8.0)

## 2013-10-21 LAB — URINE MICROSCOPIC-ADD ON

## 2013-10-21 MED ORDER — METRONIDAZOLE 500 MG PO TABS: 500.0000 mg | ORAL_TABLET | Freq: Three times a day (TID) | ORAL | Status: DC

## 2013-10-21 MED ORDER — CIPROFLOXACIN HCL 500 MG PO TABS: 500.0000 mg | ORAL_TABLET | Freq: Two times a day (BID) | ORAL | Status: DC

## 2013-10-21 MED ORDER — METRONIDAZOLE 500 MG PO TABS
500.0000 mg | ORAL_TABLET | Freq: Three times a day (TID) | ORAL | Status: DC
  Administered 2013-10-21: 500 mg via ORAL
  Filled 2013-10-21 (×3): qty 1

## 2013-10-21 MED ORDER — CIPROFLOXACIN HCL 500 MG PO TABS
500.0000 mg | ORAL_TABLET | Freq: Two times a day (BID) | ORAL | Status: DC
  Filled 2013-10-21 (×2): qty 1

## 2013-10-21 MED ORDER — LAPATINIB DITOSYLATE 250 MG PO TABS: 1250.0000 mg | ORAL_TABLET | Freq: Every day | ORAL | Status: DC

## 2013-10-21 MED ORDER — BOOST PLUS PO LIQD: 237.0000 mL | Freq: Three times a day (TID) | ORAL | Status: AC

## 2013-10-21 MED ORDER — OXYCODONE-ACETAMINOPHEN 5-325 MG PO TABS: 1.0000 | ORAL_TABLET | ORAL | Status: AC | PRN

## 2013-10-21 NOTE — Discharge Summary (Signed)
Discharge Summary  Kristie Jackson KGM:[id number] DOB: 1955-10-07  PCP: [name], MD  Admit date: 10/19/2013 Discharge date: 10/21/2013  Time spent: 25 minutes  Recommendations for Outpatient Follow-up:  1. New medication: Flagyl 500 mg every 8 hours times the next 11 days 2. New medication: Cipro 500 mg twice a day x2 days 3. Medication change: Patient previously was on vancomycin 4 times a day this medication is being discontinued 4. Medication change: Patient previously on Nexium 40 daily. This medication is on indefinite hold while patient is having her C. difficile infection treated. She says she will likely discontinue this medication altogether, I've advised her to increase not resume it until infection completely treated and she several weeks out   Discharge Diagnoses:  Active Hospital Problems   Diagnosis Date Noted  . Diarrhea 10/10/2013  . Hypothyroidism (acquired) 10/20/2013  . Underweight 10/20/2013  . Breast cancer metastasized to multiple sites 08/29/2013  . Protein-calorie malnutrition, severe 08/28/2013    Resolved Hospital Problems   Diagnosis Date Noted Date Resolved  No resolved problems to display.    Discharge Condition: Improved, being discharged home. Please note that patient requires ambulance transport home she is bedbound and requires assisted devices to even sit upright.  Diet recommendation: Regular diet with boost +3 times a day with meals  Filed Weights   10/19/13 1540 10/20/13 0400 10/21/13 0542  Weight: 54.432 kg (120 lb) 54.432 kg (120 lb) 57.2 kg (126 lb 1.7 oz)    History of present illness:  58 year old white female with past medical history of metastatic breast cancer who had been recently discharged on 9/2 after being hospitalized for C. difficile colitis presented back to the emergency room on 9/7 after having continuous diarrhea with any kind of solid food following discharge. In the emergency room, her lab work was unremarkable  with the exception of a slightly low white count, similar to previous. Patient was started on IV fluids plus Flagyl IV and admitted to the hospitalist service.   Hospital Course:  Principal Problem:   Diarrhea/C. difficile colitis: This was not treated as a medication failure this patient continued to have diarrhea since discharge. Likely this is prolonged C. difficile in the setting of continuing PPI. Given patient's initial state, vancomycin held in patients on IV Flagyl which by following day was able to be transitioned to by mouth. Patient also tolerated solid food by following day. Plan will be for her to go home on by mouth Flagyl and discontinue PPI Active Problems:   /Underweight/Protein-calorie malnutrition, severe: Patient meets criteria with BMI less than 20+ severe malnutrition in the context of chronic illness. Seen by nutrition. They recommended her being on a liberal diet along with boost +3 times a day for supplementation    Breast cancer metastasized to multiple sites: Patient will followup with oncology    Hypothyroidism (acquired): Continue on Synthroid.  UTI: Urinalysis came back with moderate leukocytes and many bacteria. Patient having low-grade temps, but stable white count. Started on by mouth Cipro which will continue x3 days.  Procedures:  None  Consultations:  None  Discharge Exam: BP 106/60  Pulse 93  Temp(Src) 100.3 F (37.9 C) (Oral)  Resp 16  Ht 5\' 8"  (1.727 m)  Wt 57.2 kg (126 lb 1.7 oz)  BMI 19.18 kg/m2  SpO2 100%  General: Alert and oriented x3, no acute distress Cardiovascular: Regular rate and rhythm, S1-S2 Respiratory: Clear auscultation bilaterally  Discharge Instructions You were cared for by a hospitalist during your hospital  stay. If you have any questions about your discharge medications or the care you received while you were in the hospital after you are discharged, you can call the unit and asked to speak with the hospitalist on call  if the hospitalist that took care of you is not available. Once you are discharged, your primary care physician will handle any further medical issues. Please note that NO REFILLS for any discharge medications will be authorized once you are discharged, as it is imperative that you return to your primary care physician (or establish a relationship with a primary care physician if you do not have one) for your aftercare needs so that they can reassess your need for medications and monitor your lab values.  Discharge Instructions   Increase activity slowly    Complete by:  As directed             Medication List    STOP taking these medications       esomeprazole 20 MG capsule  Commonly known as:  NEXIUM     vancomycin 125 MG capsule  Commonly known as:  VANCOCIN      TAKE these medications       ciprofloxacin 500 MG tablet  Commonly known as:  CIPRO  Take 1 tablet (500 mg total) by mouth 2 (two) times daily.     diazepam 10 MG tablet  Commonly known as:  VALIUM  Take 10 mg by mouth every 6 (six) hours as needed. For muscle spasms     DULoxetine 60 MG capsule  Commonly known as:  CYMBALTA  Take 60 mg by mouth daily.     enoxaparin 60 MG/0.6ML injection  Commonly known as:  LOVENOX  Inject 0.6 mLs (60 mg total) into the skin every 12 (twelve) hours.     lactose free nutrition Liqd  Take 237 mLs by mouth 3 (three) times daily with meals.     lapatinib 250 MG tablet  Commonly known as:  TYKERB  Take 5 tablets (1,250 mg total) by mouth daily.     letrozole 2.5 MG tablet  Commonly known as:  FEMARA  Take 1 tablet (2.5 mg total) by mouth daily.     levothyroxine 75 MCG tablet  Commonly known as:  SYNTHROID, LEVOTHROID  Take 75 mcg by mouth See admin instructions. Takes daily except Monday and Thursday     metroNIDAZOLE 500 MG tablet  Commonly known as:  FLAGYL  Take 1 tablet (500 mg total) by mouth 3 (three) times daily.     multivitamin with minerals Tabs tablet    Take 1 tablet by mouth daily.     OMEGA 3 PO  Take 1 capsule by mouth daily.     oxyCODONE-acetaminophen 5-325 MG per tablet  Commonly known as:  PERCOCET/ROXICET  Take 1-2 tablets by mouth every 4 (four) hours as needed for moderate pain or severe pain.     potassium chloride 20 MEQ packet  Commonly known as:  KLOR-CON  Take 20 mEq by mouth daily.     rizatriptan 10 MG tablet  Commonly known as:  MAXALT  Take 10 mg by mouth as needed for migraine. May repeat in 2 hours if needed     sucralfate 1 G tablet  Commonly known as:  CARAFATE  Take 1 g by mouth 4 (four) times daily -  with meals and at bedtime.     tiZANidine 4 MG tablet  Commonly known as:  ZANAFLEX  Take 8 mg  by mouth 3 (three) times daily.     topiramate 100 MG tablet  Commonly known as:  TOPAMAX  Take 100 mg by mouth at bedtime.       Allergies  Allergen Reactions  . Morphine And Related Nausea And Vomiting      The results of significant diagnostics from this hospitalization (including imaging, microbiology, ancillary and laboratory) are listed below for reference.    Significant Diagnostic Studies: No results found.  Microbiology: No results found for this or any previous visit (from the past 240 hour(s)).   Labs: Basic Metabolic Panel:  Recent Labs Lab 10/15/13 0425 10/19/13 1608 10/20/13 0435  NA 141 143 141  K 2.9* 4.0 3.6*  CL 107 109 108  CO2 24 21 19   GLUCOSE 113* 86 82  BUN 3* 5* 4*  CREATININE 0.33* 0.32* 0.36*  CALCIUM 8.7 10.0 9.1   Liver Function Tests:  Recent Labs Lab 10/20/13 0435  AST 29  ALT 16  ALKPHOS 87  BILITOT <0.2*  PROT 5.0*  ALBUMIN 2.2*   No results found for this basename: LIPASE, AMYLASE,  in the last 168 hours No results found for this basename: AMMONIA,  in the last 168 hours CBC:  Recent Labs Lab 10/15/13 0425 10/19/13 1608 10/20/13 0435  WBC 3.6* 2.7* 2.4*  HGB 9.9* 12.1 10.6*  HCT 29.7* 37.2 33.2*  MCV 94.9 96.9 99.7  PLT 222 160  278   Cardiac Enzymes: No results found for this basename: CKTOTAL, CKMB, CKMBINDEX, TROPONINI,  in the last 168 hours BNP: BNP (last 3 results) No results found for this basename: PROBNP,  in the last 8760 hours CBG: No results found for this basename: GLUCAP,  in the last 168 hours     Signed:  Annita Brod  [facility] 10/21/2013, 4:53 PM

## 2013-10-21 NOTE — Progress Notes (Signed)
Nursing Discharge Summary  Patient ID: Kristie Jackson MRN: [id number] DOB/AGE: February 23, 1955 58 y.o.  Admit date: 10/19/2013 Discharge date: 10/21/2013  Discharged Condition: good  Disposition: 01-Home or Self Care    Prescriptions Given: Prescription for percocet given to patient and other medications called into pharmacy. Patient verbalized understanding of medications and follow up appointments.   Means of Discharge: Patient to be transported home via [facility].   Signed: Buel Ream 10/21/2013, 6:03 PM

## 2013-10-21 NOTE — Progress Notes (Signed)
CSW consulted for transportation needs. Patient will need non-emergency ambulance transport home. CSW confirmed home address with patient ([address] [place], [place] [address]). Pt reports that her caregiver will be at her home at 6 pm and does not want to arrive to her home before then. [facility] called for transport and notified that pt caregivers will not be available until 6 pm and [facility] scheduled transport for 6 PM. Pt with [employer] and clinicals faxed for ambulance authorization. No other CSW needs identified - CSW signing off.   Alison Murray, MSW, [place] [facility] [phone]

## 2013-10-22 ENCOUNTER — Telehealth: Payer: Self-pay

## 2013-10-22 NOTE — Telephone Encounter (Signed)
Richard from [employer] called about tykerb, insurance copay was going to be about $280. They were setting pt up with [employer] to assist. Her copay would then be $0. [employer] will contact pt.

## 2013-10-23 ENCOUNTER — Ambulatory Visit (HOSPITAL_BASED_OUTPATIENT_CLINIC_OR_DEPARTMENT_OTHER): Payer: Medicare Other | Admitting: Hematology

## 2013-10-23 ENCOUNTER — Ambulatory Visit
Admission: RE | Admit: 2013-10-23 | Discharge: 2013-10-23 | Disposition: A | Discharge status: Home / Self Care | Payer: Medicare Other | Source: Ambulatory Visit | Attending: Radiation Oncology | Admitting: Radiation Oncology

## 2013-10-23 ENCOUNTER — Telehealth: Payer: Self-pay | Admitting: Hematology

## 2013-10-23 ENCOUNTER — Encounter: Payer: Self-pay | Admitting: Radiation Oncology

## 2013-10-23 ENCOUNTER — Ambulatory Visit: Payer: Medicare Other

## 2013-10-23 ENCOUNTER — Other Ambulatory Visit (HOSPITAL_BASED_OUTPATIENT_CLINIC_OR_DEPARTMENT_OTHER): Payer: Medicare Other

## 2013-10-23 VITALS — BP 79/50 | HR 75 | Temp 98.0°F | Resp 18 | Ht 68.0 in

## 2013-10-23 VITALS — Resp 18 | Wt 128.0 lb

## 2013-10-23 DIAGNOSIS — Z170 Estrogen receptor positive status [ER+]: Secondary | ICD-10-CM

## 2013-10-23 DIAGNOSIS — C770 Secondary and unspecified malignant neoplasm of lymph nodes of head, face and neck: Secondary | ICD-10-CM

## 2013-10-23 DIAGNOSIS — C787 Secondary malignant neoplasm of liver and intrahepatic bile duct: Secondary | ICD-10-CM

## 2013-10-23 DIAGNOSIS — C7951 Secondary malignant neoplasm of bone: Secondary | ICD-10-CM

## 2013-10-23 DIAGNOSIS — C50919 Malignant neoplasm of unspecified site of unspecified female breast: Secondary | ICD-10-CM

## 2013-10-23 DIAGNOSIS — C773 Secondary and unspecified malignant neoplasm of axilla and upper limb lymph nodes: Secondary | ICD-10-CM

## 2013-10-23 DIAGNOSIS — I951 Orthostatic hypotension: Secondary | ICD-10-CM

## 2013-10-23 DIAGNOSIS — C7952 Secondary malignant neoplasm of bone marrow: Secondary | ICD-10-CM

## 2013-10-23 DIAGNOSIS — I959 Hypotension, unspecified: Secondary | ICD-10-CM

## 2013-10-23 DIAGNOSIS — A0472 Enterocolitis due to Clostridium difficile, not specified as recurrent: Secondary | ICD-10-CM

## 2013-10-23 LAB — CBC WITH DIFFERENTIAL/PLATELET
BASO%: 0.6 % (ref 0.0–2.0)
Basophils Absolute: 0 10*3/uL (ref 0.0–0.1)
EOS ABS: 0 10*3/uL (ref 0.0–0.5)
EOS%: 1.2 % (ref 0.0–7.0)
HCT: 37.6 % (ref 34.8–46.6)
HGB: 12.1 g/dL (ref 11.6–15.9)
LYMPH%: 13.8 % — AB (ref 14.0–49.7)
MCH: 31.3 pg (ref 25.1–34.0)
MCHC: 32.2 g/dL (ref 31.5–36.0)
MCV: 97.4 fL (ref 79.5–101.0)
MONO#: 0.8 10*3/uL (ref 0.1–0.9)
MONO%: 23.2 % — AB (ref 0.0–14.0)
NEUT#: 2 10*3/uL (ref 1.5–6.5)
NEUT%: 61.2 % (ref 38.4–76.8)
PLATELETS: 316 10*3/uL (ref 145–400)
RBC: 3.86 10*6/uL (ref 3.70–5.45)
RDW: 19 % — ABNORMAL HIGH (ref 11.2–14.5)
WBC: 3.3 10*3/uL — ABNORMAL LOW (ref 3.9–10.3)
lymph#: 0.5 10*3/uL — ABNORMAL LOW (ref 0.9–3.3)
nRBC: 0 % (ref 0–0)

## 2013-10-23 LAB — COMPREHENSIVE METABOLIC PANEL (CC13)
ALT: 19 U/L (ref 0–55)
ANION GAP: 7 meq/L (ref 3–11)
AST: 21 U/L (ref 5–34)
Albumin: 2.3 g/dL — ABNORMAL LOW (ref 3.5–5.0)
Alkaline Phosphatase: 89 U/L (ref 40–150)
BUN: 5.7 mg/dL — AB (ref 7.0–26.0)
CO2: 24 meq/L (ref 22–29)
CREATININE: 0.5 mg/dL — AB (ref 0.6–1.1)
Calcium: 9.5 mg/dL (ref 8.4–10.4)
Chloride: 111 mEq/L — ABNORMAL HIGH (ref 98–109)
Glucose: 98 mg/dl (ref 70–140)
Potassium: 3.9 mEq/L (ref 3.5–5.1)
Sodium: 142 mEq/L (ref 136–145)
Total Bilirubin: <0.2 mg/dL (ref 0.20–1.20)
Total Protein: 5.4 g/dL — ABNORMAL LOW (ref 6.4–8.3)

## 2013-10-23 LAB — TECHNOLOGIST REVIEW

## 2013-10-23 LAB — CANCER ANTIGEN 27.29: CA 27.29: 31 U/mL (ref 0–39)

## 2013-10-23 MED ORDER — SODIUM CHLORIDE 0.9 % IV SOLN
Freq: Once | INTRAVENOUS | Status: AC
  Administered 2013-10-23: 13:00:00 via INTRAVENOUS

## 2013-10-23 MED ORDER — HYDROMORPHONE HCL PF 1 MG/ML IJ SOLN
1.0000 mg | Freq: Once | INTRAMUSCULAR | Status: DC
Start: 2013-10-23 — End: 2013-10-26
  Filled 2013-10-23: qty 1

## 2013-10-23 MED ORDER — HYDROMORPHONE HCL 2 MG PO TABS: 2.0000 mg | ORAL_TABLET | ORAL | Status: DC | PRN

## 2013-10-23 NOTE — Progress Notes (Signed)
Patient has PT and OT come into her home 2-3 times per week. Patient is unable to ambulate. Patient has 24 hour aid in the home. Patient discharged on 9/2 after being hospitalized for C. Diff then readmitted on 9/7 for the same. She was discharged the next day. Reports Tykerb was discontinued temporarily. Reports drinking 2-3 cans of boost per day. Reports constant right leg and low back pain 3-9 on a scale of 0-10. Reports pain had been well controlled until she was hospitalized with c diff and had to be turned several times per day. Reports taking percocet to manage pain. Reports she has not had any diarrhea in 36 hours. Reports eating a BRAT diet. Reports tingling in her arms and legs. BP low. Denies feeling light headed or dizzy today.

## 2013-10-23 NOTE — Progress Notes (Signed)
Radiation Oncology         [phone]) [phone] ________________________________  Name: Kristie Jackson MRN: [id number]  Date: 10/23/2013  DOB: 04/21/1955    Follow-Up Visit Note  CC: [name], MD  Horton Finer,*  Diagnosis:   58 yo woman with skeletal metastases from breast cancer s/p palliative radiotherapy        09/04/13-09/19/13   Thoracic spine from T4 to T6 inclusive was treated to 30 Gy in 10 fractions of 3 Gy.   The lower spine from T12 through the sacrum and right hip were treated to 30 Gy in 10 fractions of 3 Gy.   Interval Since Last Radiation:   4  weeks  Narrative:  The patient returns today for routine follow-up.  Patient has PT and OT come into her home 2-3 times per week. Patient is unable to ambulate. Patient has 24 hour aid in the home. Patient discharged on 9/2 after being hospitalized for C. Diff then readmitted on 9/7 for the same. She was discharged the next day. Reports Tykerb was discontinued temporarily. Reports drinking 2-3 cans of boost per day. Reports constant right leg and low back pain 3-9 on a scale of 0-10. Reports pain had been well controlled until she was hospitalized with c diff and had to be turned several times per day. Reports taking percocet to manage pain. Reports she has not had any diarrhea in 36 hours. Reports eating a BRAT diet. Reports tingling in her arms and legs. BP low. Denies feeling light headed or dizzy today                               ALLERGIES:  is allergic to morphine and related.  Meds: Current Outpatient Prescriptions  Medication Sig Dispense Refill  . diazepam (VALIUM) 10 MG tablet Take 10 mg by mouth every 6 (six) hours as needed. For muscle spasms      . DULoxetine (CYMBALTA) 60 MG capsule Take 60 mg by mouth daily.      Marland Kitchen enoxaparin (LOVENOX) 60 MG/0.6ML injection Inject 0.6 mLs (60 mg total) into the skin every 12 (twelve) hours.  60 Syringe  5  . esomeprazole (NEXIUM) 20 MG capsule Take 20 mg by mouth daily at  12 noon.      . lactose free nutrition (BOOST PLUS) LIQD Take 237 mLs by mouth 3 (three) times daily with meals.  90 Can  1  . letrozole (FEMARA) 2.5 MG tablet Take 1 tablet (2.5 mg total) by mouth daily.  30 tablet  2  . levothyroxine (SYNTHROID, LEVOTHROID) 75 MCG tablet Take 75 mcg by mouth See admin instructions. Takes daily except Monday and Thursday      . metroNIDAZOLE (FLAGYL) 500 MG tablet Take 1 tablet (500 mg total) by mouth 3 (three) times daily.  33 tablet  0  . Multiple Vitamin (MULTIVITAMIN WITH MINERALS) TABS tablet Take 1 tablet by mouth daily.      . Omega-3 Fatty Acids (OMEGA 3 PO) Take 1 capsule by mouth daily.      Marland Kitchen oxyCODONE-acetaminophen (PERCOCET/ROXICET) 5-325 MG per tablet Take 1-2 tablets by mouth every 4 (four) hours as needed for moderate pain or severe pain.  30 tablet  0  . polyethylene glycol powder (GLYCOLAX/MIRALAX) powder       . potassium chloride (KLOR-CON) 20 MEQ packet Take 20 mEq by mouth daily.  5 tablet  0  . rizatriptan (MAXALT) 10 MG  tablet Take 10 mg by mouth as needed for migraine. May repeat in 2 hours if needed      . sucralfate (CARAFATE) 1 G tablet Take 1 g by mouth 4 (four) times daily -  with meals and at bedtime.      Marland Kitchen tiZANidine (ZANAFLEX) 4 MG tablet Take 8 mg by mouth 3 (three) times daily.      Marland Kitchen topiramate (TOPAMAX) 100 MG tablet Take 100 mg by mouth at bedtime.      . lapatinib (TYKERB) 250 MG tablet Take 5 tablets (1,250 mg total) by mouth daily.  150 tablet  0   No current facility-administered medications for this encounter.    Physical Findings: The patient is in no acute distress. Patient is alert and oriented.  weight is 128 lb (58.06 kg). Her respiration is 18. .  No significant changes.  Lab Findings: Lab Results  Component Value Date   WBC 3.3* 10/23/2013   HGB 12.1 10/23/2013   HCT 37.6 10/23/2013   MCV 97.4 10/23/2013   PLT 316 10/23/2013    Impression:  The patient is recovering from the effects of radiation.  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  Plan:  He will continue to follow-up with med-onc for systemic therapy.  I will look forward to following her response through their correspondence, and be happy to participate in care if clinically indicated.   _____________________________________  Sheral Apley. Tammi Klippel, M.D.

## 2013-10-23 NOTE — Telephone Encounter (Signed)
lvm for pt regarding to Sept and OCT appt...sed added tx.

## 2013-10-23 NOTE — Patient Instructions (Signed)
Dehydration, Adult Dehydration is when you lose more fluids from the body than you take in. Vital organs like the kidneys, brain, and heart cannot function without a proper amount of fluids and salt. Any loss of fluids from the body can cause dehydration.  CAUSES   Vomiting.  Diarrhea.  Excessive sweating.  Excessive urine output.  Fever. SYMPTOMS  Mild dehydration  Thirst.  Dry lips.  Slightly dry mouth. Moderate dehydration  Very dry mouth.  Sunken eyes.  Skin does not bounce back quickly when lightly pinched and released.  Dark urine and decreased urine production.  Decreased tear production.  Headache. Severe dehydration  Very dry mouth.  Extreme thirst.  Rapid, weak pulse (more than 100 beats per minute at rest).  Cold hands and feet.  Not able to sweat in spite of heat and temperature.  Rapid breathing.  Blue lips.  Confusion and lethargy.  Difficulty being awakened.  Minimal urine production.  No tears. DIAGNOSIS  Your caregiver will diagnose dehydration based on your symptoms and your exam. Blood and urine tests will help confirm the diagnosis. The diagnostic evaluation should also identify the cause of dehydration. TREATMENT  Treatment of mild or moderate dehydration can often be done at home by increasing the amount of fluids that you drink. It is best to drink small amounts of fluid more often. Drinking too much at one time can make vomiting worse. Refer to the home care instructions below. Severe dehydration needs to be treated at the hospital where you will probably be given intravenous (IV) fluids that contain water and electrolytes. HOME CARE INSTRUCTIONS   Ask your caregiver about specific rehydration instructions.  Drink enough fluids to keep your urine clear or pale yellow.  Drink small amounts frequently if you have nausea and vomiting.  Eat as you normally do.  Avoid:  Foods or drinks high in sugar.  Carbonated  drinks.  Juice.  Extremely hot or cold fluids.  Drinks with caffeine.  Fatty, greasy foods.  Alcohol.  Tobacco.  Overeating.  Gelatin desserts.  Wash your hands well to avoid spreading bacteria and viruses.  Only take over-the-counter or prescription medicines for pain, discomfort, or fever as directed by your caregiver.  Ask your caregiver if you should continue all prescribed and over-the-counter medicines.  Keep all follow-up appointments with your caregiver. SEEK MEDICAL CARE IF:  You have abdominal pain and it increases or stays in one area (localizes).  You have a rash, stiff neck, or severe headache.  You are irritable, sleepy, or difficult to awaken.  You are weak, dizzy, or extremely thirsty. SEEK IMMEDIATE MEDICAL CARE IF:   You are unable to keep fluids down or you get worse despite treatment.  You have frequent episodes of vomiting or diarrhea.  You have blood or green matter (bile) in your vomit.  You have blood in your stool or your stool looks black and tarry.  You have not urinated in 6 to 8 hours, or you have only urinated a small amount of very dark urine.  You have a fever.  You faint. MAKE SURE YOU:   Understand these instructions.  Will watch your condition.  Will get help right away if you are not doing well or get worse. Document Released: 01/30/2005 Document Revised: 04/24/2011 Document Reviewed: 09/19/2010 Exit[employer] Patient Information 2015 [employer]. This information is not intended to replace advice given to you by your health care provider. Make sure you discuss any questions you have with your health care   provider.  

## 2013-10-24 NOTE — Progress Notes (Signed)
Clinical Social Work  CSW received insurance auth from [employer] for [employer] on 10/21/13. Auth # [id number]. CSW called and gave this information to [place] [facility] as well.  Sindy Messing, LCSW (Coverage for [employer])

## 2013-10-26 ENCOUNTER — Encounter: Payer: Self-pay | Admitting: Hematology

## 2013-10-26 NOTE — Progress Notes (Signed)
[facility] OFF[facility] PROGRESS NOTE 10/23/2013  Kristie Shelling, MD [address], [address]   [id number]  DIAGNOSIS:  1. Metastatic Breast cancer (MBC) Her-2 positive disease. 2. Dehydration/Hypotension required fluid resuscitation in office today. 3. Recent hospitalization for C Diff colitis from 10/19/13 through 10/21/13. 4. To discuss further management for her [place].  CURRENT TREATMENT:   1. Femara 2.5 mg daily started on 09/05/2013. (I will continue that) 2. Tykerb 1250 mg by mouth daily beginning July 2015 (I will discontinue that because of ongoing diarrhea and dehydration and 2 hospitalizations) 3. Herceptin to start every 3 weeks next week after the Echocardiogram. 4. Zometa or Xgeva to start after dental clearance for skeletal metastasis.  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 Her pathology is 100% ER positive she is also HER-2 positive thus her combination therapy with Femara and Tykerb was initiated but now with this diarrhrea caused by c diff, it would be a good time to change the tykerb to herceptin. She is accompanied today by her caregiver Kristie Jackson.   She had recently two hospitalizations first from 09/21/13 through 10/15/13 with diarrhea and dehydration and then second one was from 10/19/13 through 10/21/13. I reviewed the hospital course for both and they are in [facility] EMR. When discharged she was send home on Ciprofloxacin x 2 days which she has completed and Flagyl x 11 days which she is still taking.   She has a history of spinal cord injury dating back to 1995 after a horse fall and recently she had a second complication in her spinal cord when she had spinal cord compression from her vertebral mets and required radiation to her T4-6 spine, T12 and right hip. She got 30 Gy with  Dr Tammi Klippel which she completed on 09/19/13. Patient also have history of pulmonary embolism on Lovenox injections 60 mg sq bid. She also have history of migraine headaches. MRI brain did not show braain mets but + skull mets on 08/29/13. The MRI of spine showed osseous mets in C spine, pathologic fracture T1 but no epidural tumor. Pathologic fracture L1. Pathologic fracture of T5. There was extensive disease noted at all levels. She got radiation to the areas which were painful or risk for cord compression.  Last restaging CT Chest 08/29/13 showed: IMPRESSION:  1. Segmental pulmonary embolism to the right lower lobe. 2. Findings consistent with right breast carcinoma with axillary/pectoral nodal spread and diffuse osseous metastatic disease. 3. L1 and L3 pathologic compression fractures. Retropulsion and extraosseous tumor at L3 causes advanced spinal canal and left foraminal stenosis. 4. Extensive demineralization of the right acetabulum, at significant risk for pathologic fracture. 5. 5 cm hemangioma in the right liver. Two tiny low densities in the  liver which can be followed by imaging. 6. Focal urothelial thickening in the right bladder, a possible second malignancy. 7. Borderline enlarged right supraclavicular lymph node. This was my first encounter with the patient and I reviewed all her records from clinic and the hospital.     MEDICAL HISTORY: Past Medical History  Diagnosis Date  . Central hypothyroidism   . Hyperlipidemia   . Spinal cord injury   . Depression   . Cancer     spinal metastasis  . PONV (postoperative nausea and vomiting)   . Pulmonary embolism 08/2013  . C. difficile diarrhea  INTERIM HISTORY: has Fracture of L3 vertebra; Fracture of L1 vertebra; Substance abuse; Protein-calorie malnutrition, severe; Breast cancer metastasized to multiple sites; Weight loss; Hypothyroidism (acquired); Underweight; C. difficile diarrhea; and Hypotension on her problem list.    Patient  had EKG done but I do not see any baseline Echo done on her which should be important to get prior to Tykerb or Herceptin because of the possibility of a reversible decline in the ejection fraction with their usage.  She was hypotensive in the clinic today but denied any ongoing diarrhea. She is not able to walk or ambulate and wheel chair bound now. We did check her labs. I did order 1 liter of fluid in office and pre-hydration her BP was 60-70/50 and post hydration it returned to 100/65.  ALLERGIES:  is allergic to morphine and related.  MEDICATIONS: has a current medication list which includes the following prescription(s): diazepam, duloxetine, enoxaparin, esomeprazole, hydromorphone, lactose free nutrition, lapatinib, letrozole, levothyroxine, metronidazole, multivitamin with minerals, omega-3 fatty acids, oxycodone-acetaminophen, polyethylene glycol powder, potassium chloride, rizatriptan, sucralfate, tizanidine, and topiramate, and the following Facility-Administered Medications: hydromorphone.  SURGICAL HISTORY:  Past Surgical History  Procedure Laterality Date  . Laminectomy    . Abdominal hysterectomy      REVIEW OF SYSTEMS:   Constitutional: Denies fevers, chills or abnormal weight loss, still c/o pain We did not give iv pain med due to low bp but i gave a script for dilaudid oral as out pateint.  Eyes: Denies blurriness of vision Ears, nose, mouth, throat, and face: Denies mucositis or sore throat Respiratory: Denies cough, dyspnea or wheezes Cardiovascular: Denies palpitation, chest discomfort or lower extremity swelling Gastrointestinal:  Denies nausea, heartburn or change in bowel habits Skin: Denies abnormal skin rashes Lymphatics: Denies new lymphadenopathy or easy bruising Neurological:Denies numbness, tingling or new weaknesses Behavioral/Psych: Mood is stable, no new changes  All other systems were reviewed with the patient and are negative.  PHYSICAL  EXAMINATION: ECOG PERFORMANCE STATUS: 2 - Symptomatic, <50% confined to bed  Blood pressure 79/50, pulse 75, temperature 98 F (36.7 C), temperature source Oral, resp. rate 18, height _0  (1.727 m), weight 0 lb (0 kg), SpO2 100.00%. Repeat BP post hydration was 100/65.  GENERAL:alert, no distress and comfortable; sitting in wheelchair comfortable.  SKIN: skin color, texture, turgor are normal, no rashes or significant lesions EYES: normal, Conjunctiva are pink and non-injected, sclera clear OROPHARYNX:no exudate, no erythema and lips, buccal mucosa, and tongue normal NECK: supple, thyroid normal size, non-tender, without nodularityLYMPH:  no palpable lymphadenopathy in the cervical,  Supraclavicular BREASTS: exam deferred today. It was 8x8 cm on 09/10/13 exam.LUNGS: clear to auscultation with normal breathing effort, no wheezes or rhonchi HEART: regular rate & rhythm and no murmurs and no lower extremity edema ABDOMEN:abdomen soft, non-tender and normal bowel sounds Musculoskeletal:no cyanosis of digits and no clubbing NEURO: alert & oriented x 3 with fluent speech, lower extremity decreased movement (R greater than L) to the hips.   Labs:  Lab Results  Component Value Date   WBC 3.3* 10/23/2013   HGB 12.1 10/23/2013   HCT 37.6 10/23/2013   MCV 97.4 10/23/2013   PLT 316 10/23/2013   NEUTROABS 2.0 10/23/2013      Chemistry      Component Value Date/Time   NA 142 10/23/2013 1017   NA 141 10/20/2013 0435   K 3.9 10/23/2013 1017   K 3.6* 10/20/2013 0435   CL 108 10/20/2013 0435   CO2 24 10/23/2013 1017  CO2 19 10/20/2013 0435   BUN 5.7* 10/23/2013 1017   BUN 4* 10/20/2013 0435   CREATININE 0.5* 10/23/2013 1017   CREATININE 0.36* 10/20/2013 0435      Component Value Date/Time   CALCIUM 9.5 10/23/2013 1017   CALCIUM 9.1 10/20/2013 0435   CALCIUM 10.0 08/28/2013 1000   ALKPHOS 89 10/23/2013 1017   ALKPHOS 87 10/20/2013 0435   AST 21 10/23/2013 1017   AST 29 10/20/2013 0435   ALT 19 10/23/2013 1017   ALT 16  10/20/2013 0435   BILITOT <0.20 10/23/2013 1017   BILITOT <0.2* 10/20/2013 0435       Basic Metabolic Panel:  Recent Labs Lab 10/19/13 1608 10/20/13 0435 10/23/13 1017  NA 143 141 142  K 4.0 3.6* 3.9  CL 109 108  --   CO2 _0 GLUCOSE 86 82 98  BUN 5* 4* 5.7*  CREATININE 0.32* 0.36* 0.5*  CALCIUM 10.0 9.1 9.5   Liver Function Tests:  Recent Labs Lab 10/20/13 0435 10/23/13 1017  AST 29 21  ALT 16 19  ALKPHOS 87 89  BILITOT <0.2* <0.20  PROT 5.0* 5.4*  ALBUMIN 2.2* 2.3*   CBC:  Recent Labs Lab 10/19/13 1608 10/20/13 0435 10/23/13 1017  WBC 2.7* 2.4* 3.3*  NEUTROABS  --   --  2.0  HGB 12.1 10.6* 12.1  HCT 37.2 33.2* 37.6  MCV 96.9 99.7 97.4  PLT 160 278 316    RADIOGRAPHIC STUDIES: I reviewed them all today. Ct Head Wo Contrast  08/28/2013   CLINICAL DATA:  The encephalopathy.  EXAM: CT HEAD WITHOUT CONTRAST  TECHNIQUE: Contiguous axial images were obtained from the base of the skull through the vertex without intravenous contrast.  COMPARISON:  None.  FINDINGS: Skull and Sinuses:There are ill-defined lytic lesions throughout the calvarium consistent with metastatic disease. The most notable lesions are in the right parietal and central occipital-parietal region measuring up to 3.5 cm in diameter. These lesions errode through the inner table, including at the level of the superior sagittal sinus.  Orbits: No acute abnormality.  Brain: No evidence of acute abnormality, such as acute infarction, hemorrhage, hydrocephalus, or mass lesion/mass effect. Apparent low-density in the medial right temporal lobe was evaluated on coronal reformats. This is in a region of streak artifact, the most likely cause. There is no mass effect in this region. No evidence of low-density along the cingulate gyrus or insula.  IMPRESSION: 1. No acute intracranial findings. 2. Multi focal calvarial metastatic disease. There is multi focal erosion through the inner table, including over the  superior sagittal sinus. Suggest enhanced MRI followup.   Electronically Signed   By: Jorje Guild M.D.   On: 08/28/2013 02:21   Ct Chest W Contrast   08/29/2013   CLINICAL DATA:  Evaluate for source of malignancy.  EXAM: CT CHEST, ABDOMEN, AND PELVIS WITH CONTRAST  TECHNIQUE: Multidetector CT imaging of the chest, abdomen and pelvis was performed following the standard protocol during bolus administration of intravenous contrast.  CONTRAST:  71m OMNIPAQUE IOHEXOL 300 MG/ML  SOLN  COMPARISON:  None.  FINDINGS: CT CHEST FINDINGS  THORACIC INLET/BODY WALL:  There is asymmetric, spiculated sub areolar tissue on the right. There is right axillary and deep pectoral lymphadenopathy with cortical thickening up to 1 cm. There is a 7 mm right supraclavicular lymph node which is moderately suspicious.  Bilateral thyroid nodules with coarse calcification, measuring up to 2 cm on the right. This is likely incidental given the  patient's overall clinical status.  MEDIASTINUM:  Normal heart size. No pericardial effusion. There is residual thymus noted. Filling present within segmental arteries of the right lower lobe. No lymphadenopathy.  LUNG WINDOWS:  No definitive metastasis. Small bilateral pleural effusions with dependent atelectasis.  OSSEOUS:  See below  CT ABDOMEN AND PELVIS FINDINGS  BODY WALL: Unremarkable.  Liver: There is a 5 cm mass in segment 6 which has peripheral nodular enhancement and centripetal fill-in. There is a 5 mm and 7 mm low-density nodules within the right hepatic lobe which may enhance on delayed imaging.  Biliary: Prominent intrahepatic biliary tree without visible cause.  Pancreas: Prominent diameter of the main pancreatic duct, measuring 4 mm. There is no visible mass to cause obstruction.  Spleen: Unremarkable.  Adrenals: Unremarkable.  Kidneys and ureters: 4 mm nonobstructive stone in the interpolar left kidney.  Bladder: Focal thickening and enhancement along the right bladder base,  measuring up to 7 mm in thickness. There is no excretory contrast to suggest layering hyperdense urine.  Reproductive: Hysterectomy.  Probable salpingo oophorectomies.  Bowel: Possible constipation. No bowel obstruction. No evidence of bowel inflammation.  Retroperitoneum: No mass or adenopathy.  Peritoneum: No ascites or pneumoperitoneum.  Vascular: No acute abnormality.  OSSEOUS: There is diffuse osteolytic and sclerotic bone metastases.  Notable deposits:  1. Extensive demineralization of the right pelvis, centered on the acetabulum, with extra osseous growth. No evidence of acute fracture. 2. 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 There is extra osseous tumor at the level of the left pedicle, which infiltrates the canal and left foramen. 3. L1 compression fracture with height loss approximately 25%. 4. Multiple spinal metastases likely have extra osseous tumor extension, including T5 with perispinal growth on the right. The T5 body is mildly compressed on the right, without discrete fracture line.  Critical Value/emergent results were called by telephone at the time of interpretation on 08/29/2013 at 1:24 am to [place], who verbally acknowledged these results.  IMPRESSION: 1. Segmental pulmonary embolism to the right lower lobe. 2. Findings consistent with right breast carcinoma with axillary/pectoral nodal spread and diffuse osseous metastatic disease. 3. L1 and L3 pathologic compression fractures. Retropulsion and extraosseous tumor at L3 causes advanced spinal canal and left foraminal stenosis. 4. Extensive demineralization of the right acetabulum, at significant risk for pathologic fracture. 5. 5 cm hemangioma in the right liver. Two tiny low densities in the liver which can be followed by imaging. 6. Focal urothelial thickening in the right bladder, a possible second malignancy. 7. Borderline enlarged right  supraclavicular lymph node.   Electronically Signed   By: Jorje Guild M.D.   On: 08/29/2013 01:31   Mr Jeri Cos JK Contrast  08/29/2013   CLINICAL DATA:  Extensive metastatic disease.  EXAM: MRI HEAD WITHOUT AND WITH CONTRAST  TECHNIQUE: Multiplanar, multiecho pulse sequences of the brain and surrounding structures were obtained without and with intravenous contrast.  CONTRAST:  56mL MULTIHANCE GADOBENATE DIMEGLUMINE 529 MG/ML IV SOLN  COMPARISON:  CT head without contrast 08/28/2013  FINDINGS: Multiple skull metastases are again noted. The largest lesions are in the parietal bones and at the apex of the occipital bone just below the lambdoid suture. The lesion in the right parietal bone extends to the dura with some dural thickening. There PE occipital lesion also extends to the dura adjacent to the superior sagittal sinus. Sinus is patent.  No parenchymal lesions are evident.  No acute infarct, hemorrhage or parenchymal mass lesion is present. There is no significant white matter disease.  Flow is present in the major intracranial arteries. The globes and orbits are intact. The paranasal sinuses and mastoid air cells are clear.  IMPRESSION: 1. Extensive skull metastases. 2. At least 2 of these lesions extend to the dura with focal dural enhancement suggesting dural metastases or reactive change associated with the adjacent skull metastasis. 3. No evidence for parenchymal metastases. 4. The MRI appearance of the brain is normal for age.   Electronically Signed   By: Lawrence Santiago M.D.   On: 08/29/2013 13:55   Mr Cervical Spine W Wo Contrast  08/29/2013   CLINICAL DATA:  Metastatic disease to the bones.  EXAM: MRI CERVICAL SPINE WITHOUT AND WITH CONTRAST  TECHNIQUE: Multiplanar and multiecho pulse sequences of the cervical spine, to include the craniocervical junction and cervicothoracic junction, were obtained according to standard protocol without and with intravenous contrast.  CONTRAST:  70m MULTIHANCE  GADOBENATE DIMEGLUMINE 529 MG/ML IV SOLN  COMPARISON:  None.  FINDINGS: There are metastatic lesions involving every cervical vertebra as well as the clivus and the visualized portions of the occipital and parietal bones. There is a pathologic compression fracture of the anterior aspect of T1. There is no tumor protrusion into the spinal canal or neural foramina.  There is a small central disc protrusion at C5-6 which indents the ventral aspect of the spinal cord without myelopathy. This protrusion extends into the right neural foramen and osteophytes extend into the left neural foramen is could affect either or both C6 nerves.  Tumor is most prominent in the right lateral mass of C6 and in the left lateral mass of C7. There is edema in the posterior paraspinal musculature throughout the cervical spine.  The metastases enhance after contrast administration.  Lymphoid tissue is prominent at the base of the tongue.  There is a complex nodule in the right side of the thyroid gland.  The patient has had previous posterior decompression at C3-4.  There is a tiny focal area of abnormal signal in the left side of the spinal cord at that level which is most likely myelopathy secondary to the now relieved neural impingement.  IMPRESSION: 1. Osseous metastases in the visualized portions of the skull and at each level level of the cervical spine. No visible neural impingement by tumor at this time. 2. Pathologic fracture of the anterior aspect of T1. T1 is diffusely infiltrated by tumor. 3. No epidural tumor.   Electronically Signed   By: JRozetta NunneryM.D.   On: 08/29/2013 13:33   Mr Thoracic Spine W Wo Contrast  08/29/2013   CLINICAL DATA:  Diffuse osseous metastases from unknown primary tumor.  EXAM: MRI THORACIC SPINE WITHOUT AND WITH CONTRAST  TECHNIQUE: Multiplanar and multiecho pulse sequences of the thoracic spine were obtained without and with intravenous contrast.  CONTRAST:  128mMULTIHANCE GADOBENATE DIMEGLUMINE  529 MG/ML IV SOLN  COMPARISON:  CT scan dated 08/28/2013  FINDINGS: There are metastatic lesions involving every vertebra in the thoracic spine. There is a pathologic compression fracture of the anterior aspect of T1. There is extensive tumor infiltration and T5, T7, T8, T10, T12, and L1.  Tumor extends into the left pedicle at T11 and slightly narrows the left neural foramen and slightly compresses the left posterior lateral aspect of the thecal sac but does not compress the spinal cord. There is a subtle pathologic compression fracture of the right side  of the superior endplate of T5. No protrusion of bone or tumor or disc material into the spinal canal. No other pathologic fractures no visible epidural tumor.  IMPRESSION: 1. Extensive metastatic disease throughout the thoracic spine with a subtle pathologic fracture of the superior aspect of T5. 2. No epidural tumor or neural impingement.   Electronically Signed   By: Rozetta Nunnery M.D.   On: 08/29/2013 13:57   Mr Lumbar Spine W Wo Contrast  08/29/2013   CLINICAL DATA:  Widespread metastatic disease to the bones.  EXAM: MRI LUMBAR SPINE WITHOUT AND WITH CONTRAST  TECHNIQUE: Multiplanar and multiecho pulse sequences of the lumbar spine were obtained without and with intravenous contrast.  CONTRAST:  109mL MULTIHANCE GADOBENATE DIMEGLUMINE 529 MG/ML IV SOLN  COMPARISON:  CT scan dated 08/28/2013  FINDINGS: Conus tip is at the inferior aspect of L2.  Metastatic disease extensively involves the entire lumbar spine as well as the sacrum in the adjacent iliac bones. There is a pathologic compression fracture of the L3 vertebra with protrusion of the posterior aspect of the infiltrated vertebral body into the spinal canal compressing the left side of the thecal sac in the left lateral recess. The tumor extends into the left pedicle, lamina, and facets and also extends into the left neural foramen at L3-4.  There is a slight compression deformity of the central portion  of the L1 vertebra but there is no protrusion of bone or tumor or disc into the spinal canal at that level.  There is degenerative disc disease at L5-S1 with disc space narrowing and a 4 mm retrolisthesis with a small broad-based disc bulge with accompanying osteophytes. This touches both S1 nerves but does not compress them. There is fairly severe left foraminal stenosis at L5-S1.  There is diffuse edema in the paraspinal musculature.  After contrast administration there numerous metastases enhance.  IMPRESSION: 1. Diffuse metastatic disease throughout the spine with a compression fracture of L3. Infiltrated bone protrudes into the spinal canal and compresses the left lateral recess in the left neural foramen at L3-4. 2. Slight pathologic compression fracture of the central portion of L1 without neural impingement. 3. There is no definitive epidural tumor.   Electronically Signed   By: Rozetta Nunnery M.D.   On: 08/29/2013 13:40   Ct Abdomen Pelvis W Contrast  08/29/2013   CLINICAL DATA:  Evaluate for source of malignancy.  EXAM: CT CHEST, ABDOMEN, AND PELVIS WITH CONTRAST  TECHNIQUE: Multidetector CT imaging of the chest, abdomen and pelvis was performed following the standard protocol during bolus administration of intravenous contrast.  CONTRAST:  75mL OMNIPAQUE IOHEXOL 300 MG/ML  SOLN  COMPARISON:  None.  FINDINGS: CT CHEST FINDINGS  THORACIC INLET/BODY WALL:  There is asymmetric, spiculated sub areolar tissue on the right. There is right axillary and deep pectoral lymphadenopathy with cortical thickening up to 1 cm. There is a 7 mm right supraclavicular lymph node which is moderately suspicious.  Bilateral thyroid nodules with coarse calcification, measuring up to 2 cm on the right. This is likely incidental given the patient's overall clinical status.  MEDIASTINUM:  Normal heart size. No pericardial effusion. There is residual thymus noted. Filling present within segmental arteries of the right lower lobe. No  lymphadenopathy.  LUNG WINDOWS:  No definitive metastasis. Small bilateral pleural effusions with dependent atelectasis.  OSSEOUS:  See below  CT ABDOMEN AND PELVIS FINDINGS  BODY WALL: Unremarkable.  Liver: There is a 5 cm mass in segment 6 which has  peripheral nodular enhancement and centripetal fill-in. There is a 5 mm and 7 mm low-density nodules within the right hepatic lobe which may enhance on delayed imaging.  Biliary: Prominent intrahepatic biliary tree without visible cause.  Pancreas: Prominent diameter of the main pancreatic duct, measuring 4 mm. There is no visible mass to cause obstruction.  Spleen: Unremarkable.  Adrenals: Unremarkable.  Kidneys and ureters: 4 mm nonobstructive stone in the interpolar left kidney.  Bladder: Focal thickening and enhancement along the right bladder base, measuring up to 7 mm in thickness. There is no excretory contrast to suggest layering hyperdense urine.  Reproductive: Hysterectomy.  Probable salpingo oophorectomies.  Bowel: Possible constipation. No bowel obstruction. No evidence of bowel inflammation.  Retroperitoneum: No mass or adenopathy.  Peritoneum: No ascites or pneumoperitoneum.  Vascular: No acute abnormality.  OSSEOUS: There is diffuse osteolytic and sclerotic bone metastases.  Notable deposits:  1. Extensive demineralization of the right pelvis, centered on the acetabulum, with extra osseous growth. No evidence of acute fracture. 2. 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 There is extra osseous tumor at the level of the left pedicle, which infiltrates the canal and left foramen. 3. L1 compression fracture with height loss approximately 25%. 4. Multiple spinal metastases likely have extra osseous tumor extension, including T5 with perispinal growth on the right. The T5 body is mildly compressed on the right, without discrete fracture line.  Critical Value/emergent  results were called by telephone at the time of interpretation on 08/29/2013 at 1:24 am to [place], who verbally acknowledged these results.  IMPRESSION: 1. Segmental pulmonary embolism to the right lower lobe. 2. Findings consistent with right breast carcinoma with axillary/pectoral nodal spread and diffuse osseous metastatic disease. 3. L1 and L3 pathologic compression fractures. Retropulsion and extraosseous tumor at L3 causes advanced spinal canal and left foraminal stenosis. 4. Extensive demineralization of the right acetabulum, at significant risk for pathologic fracture. 5. 5 cm hemangioma in the right liver. Two tiny low densities in the liver which can be followed by imaging. 6. Focal urothelial thickening in the right bladder, a possible second malignancy. 7. Borderline enlarged right supraclavicular lymph node.   Electronically Signed   By: Jorje Guild M.D.   On: 08/29/2013 01:31   Ct Biopsy  09/01/2013   CLINICAL DATA:  Extensive osseous metastatic disease, large right iliac/ acetabular destructive bone mass  EXAM: CT GUIDED CORE BIOPSY OF RIGHT ILIAC DESTRUCTIVE BONE MASS  ANESTHESIA/SEDATION: Two  Mg IV Versed; 100 mcg IV Fentanyl  Total Moderate Sedation Time: 15 minutes.  PROCEDURE: The procedure risks, benefits, and alternatives were explained to the patient. Questions regarding the procedure were encouraged and answered. The patient understands and consents to the procedure.  The right hip region was prepped with Betadinein a sterile fashion, and a sterile drape was applied covering the operative field. A sterile gown and sterile gloves were used for the procedure. Local anesthesia was provided with 1% Lidocaine.  Previous imaging reviewed. Patient positioned supine. Noncontrast localization CT performed. The large right iliac destructive bone mass was localized. Under sterile conditions and local anesthesia, a 17 gauge 6.8 cm access needle was advanced percutaneously from a right anterior  oblique approach. Needle position confirmed within the bone mass. 18 gauge core biopsies obtained. Samples placed in formalin. No immediate complication. Patient tolerated the biopsy well.  Complications: No immediate  FINDINGS: Imaging confirms needle placed in in the right iliac destructive  bone mass.  IMPRESSION: Successful right iliac destructive bone mass 18 gauge core biopsy.   Electronically Signed   By: Ruel Favors M.D.   On: 09/01/2013 15:32   Dg Chest Port 1 View  08/27/2013   CLINICAL DATA:  Fever.  Recent diagnosis of cancer  EXAM: PORTABLE CHEST - 1 VIEW  COMPARISON:  None.  FINDINGS: Small streaky opacities in the lower lungs. No definitive pneumonia. No edema, effusion, or pneumothorax. There is pulmonary hyperinflation suggesting COPD. Normal heart size and upper mediastinal contours when accounting for distortion from rightward rotation.  IMPRESSION: COPD and mild basilar atelectasis. No definitive pneumonia to explain fever.   Electronically Signed   By: Tiburcio Pea M.D.   On: 08/27/2013 21:49    PATHOLOGY: 09/02/2013  Bone, biopsy, Destructive rt iliac bone lesion - METASTATIC CARCINOMA.- SEE COMMENT.1 of 2 FINAL for Koike, Lucylle ([id number])Microscopic CommentGiven the clinical findings, the features are consistent with metastatic grade II breast ductal carcinoma. Abprognostic profile will be performed and the results reported separately. (JBK:gt, 09/02/13)JOSHUA KISH MD Pathologist, Electronic Signature (Case signed 09/02/2013)  PROGNOSTIC INDICATORS - ACIS Results: IMMUNOHISTOCHEMICAL AND MORPHOMETRIC ANALYSIS BY THE AUTOMATED CELLULAR IMAGING SYSTEM (ACIS) Estrogen Receptor: 100%, POSITIVE, STRONG STAINING INTENSITY Progesterone Receptor: 0%, NEGATIVE Proliferation Marker Ki67: 53% HER2 NEU CISH shows amplification ratio 3.63, copy no 5.45    ASSESSMENT: Kristie Jackson 58 y.o. female with a history of 1. MBC ER+, HER2+ Extensive skeletal metastasis.Recent 2 hospitalizations  for c diff colitis and diarrhea/dehydration. Lapatinib could have exacerbated this diarrhea.  PLAN:   1. Metastatic Breast Cancer  complicated by L3 compression and moderate to advanced spinal stenosis -Mets to the spine, right acetabulum with cord compression, dura, axillary, supraclavicular LN, liver  --Discussed with pathology previously and consistent with above. 100% ER+ confirmed by Dr. Colonel Bald. We will CONTINUE femara 2.5 mg daily. Her HER-2 was positive and she is been started on Tykerb 1250 mg by mouth daily. After the 2 recent hospitalizations, I will stop tykerb and replace it with IV Herceptin starting next week. We will get a baseline echo to assess cardiac ejection fraction next week (10/27/13).    --Palliative XRT per Radiation oncology completed.  --We will consider addition of zometa or denosumab based on extensive skeletal metastases to help reduce risk of skeletal related events.  She will require a baseline dental evaluation. I emphasized to her that it is very important and she will make an appointment with her dentist.  --I am planning to do a PET scan in about 6 weeks for restaging of her disease and seeing if she has stable disease, progressive disease or responsive disease. That will further dictate my plan for her cancer. We have good options like Perjeta and TDM-1 antibody.    2. R lower lobe segmental PE secondary to #1 and immobilization  --Lovenox 60 mg q 12 hours.   3. ECOG 3.   4. Hypothyroidism. --Continue Levothyroxine 47 mcg daily except Monday and Thursday.   5. Bladder/ Bowel incontinence secondary to #1.   6. Pain secondary to #1.  --Continue Percocet 1-2 tabs every 6 hours as need for pain.  Continue neurotin 1,2000 mg tid. I gave oral dilaudid to see if that is more effective for her pain control and if that works better we can switch from percocet to dilaudid.  7. L3 compression fracture with greater than 50% height loss and bony retropulsion causing  moderate to advanced spinal canal stenosis. --Secondary to #1.  Completed XRT  to affected site.  Patient was on dexamethasone 4 mg every 6 hours which was tapered down.   8. History of central cord injury. After a horse fall in 1995.  9. Depression. --Continue cymbalta 60 mg daily.   10. Follow up.  --We'll followup with the patient in 3 weeks from time she starts herceptin and follow her labs. 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 She will likely need dental clearance prior to initiating therapy with Zometa and/or Xgeva.   All questions were answered. The patient knows to call the clinic with any problems, questions or concerns. We can certainly see the patient much sooner if necessary. She understands the goal of this therapy is palliative not curative and if she does not tolerate the treatment well or keep getting re-hospitalized with complications and poor tolerance to systemic therapy, it would be very reasonable to initiate a discussion about hospice care. Most patients with her 2 positive disease stage 4 can survive for many years if they can tolerate a regimen and respond to it.    I spent 35 minutes counseling the patient face to face. The total time spent in the appointment was 50 minutes.    Bernadene Bell, MD Medical Hematologist/Oncologist [place] Pager: [phone] Office No: [phone]

## 2013-10-27 ENCOUNTER — Ambulatory Visit (HOSPITAL_COMMUNITY): Payer: Medicare Other

## 2013-10-27 ENCOUNTER — Telehealth: Payer: Self-pay | Admitting: Hematology

## 2013-10-27 ENCOUNTER — Telehealth: Payer: Self-pay | Admitting: *Deleted

## 2013-10-27 NOTE — Telephone Encounter (Signed)
s.w. pt caregiver and advised on all sept and oct appt....ok and aware

## 2013-10-27 NOTE — Telephone Encounter (Signed)
Per staff message and POF I have scheduled appts. Advised scheduler of appts. No available on 9/16 moved to 9/17   JMW

## 2013-10-28 ENCOUNTER — Ambulatory Visit (HOSPITAL_COMMUNITY)
Admission: RE | Admit: 2013-10-28 | Discharge: 2013-10-28 | Disposition: A | Discharge status: Home / Self Care | Payer: Medicare Other | Source: Ambulatory Visit | Attending: Hematology | Admitting: Hematology

## 2013-10-28 ENCOUNTER — Telehealth: Payer: Self-pay | Admitting: Hematology

## 2013-10-28 ENCOUNTER — Other Ambulatory Visit: Payer: Self-pay | Admitting: Hematology

## 2013-10-28 DIAGNOSIS — F17200 Nicotine dependence, unspecified, uncomplicated: Secondary | ICD-10-CM | POA: Diagnosis not present

## 2013-10-28 DIAGNOSIS — C50919 Malignant neoplasm of unspecified site of unspecified female breast: Secondary | ICD-10-CM | POA: Diagnosis not present

## 2013-10-28 DIAGNOSIS — E785 Hyperlipidemia, unspecified: Secondary | ICD-10-CM | POA: Diagnosis not present

## 2013-10-28 DIAGNOSIS — I519 Heart disease, unspecified: Secondary | ICD-10-CM

## 2013-10-28 DIAGNOSIS — I951 Orthostatic hypotension: Secondary | ICD-10-CM

## 2013-10-28 NOTE — Telephone Encounter (Signed)
Pt's caregiver came by to pick up schedule...KJ

## 2013-10-29 ENCOUNTER — Telehealth: Payer: Self-pay | Admitting: Medical Oncology

## 2013-10-29 ENCOUNTER — Encounter: Payer: Self-pay | Admitting: Medical Oncology

## 2013-10-29 NOTE — Telephone Encounter (Signed)
Per Dr. Lona Kettle I called pt to inform her that her EF is 40-45%. He is going to hold her herceptin infusions for now.  He is going to repeat her Echo in 4 weeks and he also is going to make a referral to a cardiologist. He would like for her to continue taking the femara as prescribed. She voiced understanding and I asked her to call us back with any questions or concerns.

## 2013-10-30 ENCOUNTER — Ambulatory Visit: Payer: Medicare Other

## 2013-11-03 ENCOUNTER — Telehealth: Payer: Self-pay | Admitting: Dietician

## 2013-11-03 NOTE — Telephone Encounter (Signed)
Brief Outpatient Oncology Nutrition Note  Patient has been identified to be at risk on malnutrition screen.  Wt Readings from Last 10 Encounters:  10/23/13 128 lb (58.06 kg)  10/21/13 126 lb 1.7 oz (57.2 kg)  10/10/13 120 lb (54.432 kg)  09/05/13 137 lb 2 oz (62.2 kg)  07/09/13 150 lb (68.04 kg)    Dx:  Breast Cancer with mets to bone.  Recent c.diff dx.  Called patient who reports improved intake.  Continues to use Boost at times.  Encouraged patient to call for any questions or stop by our office for coupons.  Antonieta Iba, RD, LDN

## 2013-11-06 ENCOUNTER — Other Ambulatory Visit: Payer: Self-pay | Admitting: *Deleted

## 2013-11-06 DIAGNOSIS — C50919 Malignant neoplasm of unspecified site of unspecified female breast: Secondary | ICD-10-CM

## 2013-11-07 ENCOUNTER — Telehealth: Payer: Self-pay | Admitting: Hematology

## 2013-11-07 NOTE — Telephone Encounter (Signed)
Called and spoke with Chantel for referral on pt scheduled apt, cld pt confirmed D/T advised her if she needed to cancel that she would need to call their office. Pt req schedule mailed to her advised mailed today.....   KJ

## 2013-11-09 ENCOUNTER — Inpatient Hospital Stay (HOSPITAL_COMMUNITY)
Admission: EM | Admit: 2013-11-09 | Discharge: 2013-11-12 | DRG: 391 | Disposition: A | Discharge status: Home / Self Care | Payer: Medicare Other | Attending: Internal Medicine | Admitting: Internal Medicine

## 2013-11-09 ENCOUNTER — Encounter (HOSPITAL_COMMUNITY): Payer: Self-pay | Admitting: Internal Medicine

## 2013-11-09 DIAGNOSIS — E43 Unspecified severe protein-calorie malnutrition: Secondary | ICD-10-CM

## 2013-11-09 DIAGNOSIS — Z993 Dependence on wheelchair: Secondary | ICD-10-CM | POA: Diagnosis not present

## 2013-11-09 DIAGNOSIS — C7951 Secondary malignant neoplasm of bone: Secondary | ICD-10-CM | POA: Diagnosis present

## 2013-11-09 DIAGNOSIS — R197 Diarrhea, unspecified: Secondary | ICD-10-CM | POA: Diagnosis not present

## 2013-11-09 DIAGNOSIS — R634 Abnormal weight loss: Secondary | ICD-10-CM

## 2013-11-09 DIAGNOSIS — E876 Hypokalemia: Secondary | ICD-10-CM | POA: Diagnosis present

## 2013-11-09 DIAGNOSIS — I5022 Chronic systolic (congestive) heart failure: Secondary | ICD-10-CM | POA: Diagnosis present

## 2013-11-09 DIAGNOSIS — M48061 Spinal stenosis, lumbar region without neurogenic claudication: Secondary | ICD-10-CM | POA: Diagnosis present

## 2013-11-09 DIAGNOSIS — C787 Secondary malignant neoplasm of liver and intrahepatic bile duct: Secondary | ICD-10-CM | POA: Diagnosis present

## 2013-11-09 DIAGNOSIS — E872 Acidosis, unspecified: Secondary | ICD-10-CM | POA: Diagnosis present

## 2013-11-09 DIAGNOSIS — Z86711 Personal history of pulmonary embolism: Secondary | ICD-10-CM

## 2013-11-09 DIAGNOSIS — Z886 Allergy status to analgesic agent status: Secondary | ICD-10-CM

## 2013-11-09 DIAGNOSIS — R636 Underweight: Secondary | ICD-10-CM

## 2013-11-09 DIAGNOSIS — C7952 Secondary malignant neoplasm of bone marrow: Secondary | ICD-10-CM

## 2013-11-09 DIAGNOSIS — R630 Anorexia: Secondary | ICD-10-CM | POA: Diagnosis present

## 2013-11-09 DIAGNOSIS — M8448XA Pathological fracture, other site, initial encounter for fracture: Secondary | ICD-10-CM | POA: Diagnosis present

## 2013-11-09 DIAGNOSIS — I509 Heart failure, unspecified: Secondary | ICD-10-CM | POA: Diagnosis present

## 2013-11-09 DIAGNOSIS — E038 Other specified hypothyroidism: Secondary | ICD-10-CM | POA: Diagnosis present

## 2013-11-09 DIAGNOSIS — D638 Anemia in other chronic diseases classified elsewhere: Secondary | ICD-10-CM | POA: Diagnosis present

## 2013-11-09 DIAGNOSIS — F1910 Other psychoactive substance abuse, uncomplicated: Secondary | ICD-10-CM

## 2013-11-09 DIAGNOSIS — E785 Hyperlipidemia, unspecified: Secondary | ICD-10-CM | POA: Diagnosis present

## 2013-11-09 DIAGNOSIS — C50919 Malignant neoplasm of unspecified site of unspecified female breast: Secondary | ICD-10-CM | POA: Diagnosis present

## 2013-11-09 DIAGNOSIS — R32 Unspecified urinary incontinence: Secondary | ICD-10-CM | POA: Diagnosis present

## 2013-11-09 DIAGNOSIS — G8220 Paraplegia, unspecified: Secondary | ICD-10-CM | POA: Diagnosis present

## 2013-11-09 DIAGNOSIS — E039 Hypothyroidism, unspecified: Secondary | ICD-10-CM

## 2013-11-09 DIAGNOSIS — F329 Major depressive disorder, single episode, unspecified: Secondary | ICD-10-CM | POA: Diagnosis present

## 2013-11-09 DIAGNOSIS — Z681 Body mass index (BMI) 19 or less, adult: Secondary | ICD-10-CM

## 2013-11-09 DIAGNOSIS — F3289 Other specified depressive episodes: Secondary | ICD-10-CM | POA: Diagnosis present

## 2013-11-09 DIAGNOSIS — E860 Dehydration: Secondary | ICD-10-CM

## 2013-11-09 DIAGNOSIS — A0471 Enterocolitis due to Clostridium difficile, recurrent: Secondary | ICD-10-CM

## 2013-11-09 DIAGNOSIS — Z87311 Personal history of (healed) other pathological fracture: Secondary | ICD-10-CM | POA: Diagnosis not present

## 2013-11-09 DIAGNOSIS — C800 Disseminated malignant neoplasm, unspecified: Secondary | ICD-10-CM

## 2013-11-09 DIAGNOSIS — A0472 Enterocolitis due to Clostridium difficile, not specified as recurrent: Secondary | ICD-10-CM

## 2013-11-09 LAB — CBC WITH DIFFERENTIAL/PLATELET
BASOS ABS: 0 10*3/uL (ref 0.0–0.1)
Basophils Relative: 0 % (ref 0–1)
Eosinophils Absolute: 0 10*3/uL (ref 0.0–0.7)
Eosinophils Relative: 0 % (ref 0–5)
HCT: 40 % (ref 36.0–46.0)
Hemoglobin: 13.3 g/dL (ref 12.0–15.0)
LYMPHS ABS: 0.7 10*3/uL (ref 0.7–4.0)
LYMPHS PCT: 6 % — AB (ref 12–46)
MCH: 32.6 pg (ref 26.0–34.0)
MCHC: 33.3 g/dL (ref 30.0–36.0)
MCV: 98 fL (ref 78.0–100.0)
Monocytes Absolute: 1 10*3/uL (ref 0.1–1.0)
Monocytes Relative: 9 % (ref 3–12)
NEUTROS ABS: 10.2 10*3/uL — AB (ref 1.7–7.7)
Neutrophils Relative %: 85 % — ABNORMAL HIGH (ref 43–77)
Platelets: 358 10*3/uL (ref 150–400)
RBC: 4.08 MIL/uL (ref 3.87–5.11)
RDW: 16.5 % — AB (ref 11.5–15.5)
WBC: 11.9 10*3/uL — AB (ref 4.0–10.5)

## 2013-11-09 LAB — COMPREHENSIVE METABOLIC PANEL
ALK PHOS: 104 U/L (ref 39–117)
ALT: 13 U/L (ref 0–35)
AST: 30 U/L (ref 0–37)
Albumin: 2.9 g/dL — ABNORMAL LOW (ref 3.5–5.2)
Anion gap: 17 — ABNORMAL HIGH (ref 5–15)
BUN: 9 mg/dL (ref 6–23)
CHLORIDE: 101 meq/L (ref 96–112)
CO2: 21 meq/L (ref 19–32)
Calcium: 10.2 mg/dL (ref 8.4–10.5)
Creatinine, Ser: 0.38 mg/dL — ABNORMAL LOW (ref 0.50–1.10)
GLUCOSE: 115 mg/dL — AB (ref 70–99)
POTASSIUM: 2.6 meq/L — AB (ref 3.7–5.3)
SODIUM: 139 meq/L (ref 137–147)
Total Bilirubin: <0.2 mg/dL — ABNORMAL LOW (ref 0.3–1.2)
Total Protein: 7 g/dL (ref 6.0–8.3)

## 2013-11-09 LAB — I-STAT CHEM 8, ED
BUN: 7 mg/dL (ref 6–23)
CREATININE: 0.4 mg/dL — AB (ref 0.50–1.10)
Calcium, Ion: 1.33 mmol/L — ABNORMAL HIGH (ref 1.12–1.23)
Chloride: 107 mEq/L (ref 96–112)
GLUCOSE: 99 mg/dL (ref 70–99)
HCT: 41 % (ref 36.0–46.0)
HEMOGLOBIN: 13.9 g/dL (ref 12.0–15.0)
POTASSIUM: 2.3 meq/L — AB (ref 3.7–5.3)
Sodium: 139 mEq/L (ref 137–147)
TCO2: 21 mmol/L (ref 0–100)

## 2013-11-09 LAB — MAGNESIUM: Magnesium: 1.9 mg/dL (ref 1.5–2.5)

## 2013-11-09 LAB — LIPASE, BLOOD: Lipase: 5 U/L — ABNORMAL LOW (ref 11–59)

## 2013-11-09 MED ORDER — SUCRALFATE 1 G PO TABS
1.0000 g | ORAL_TABLET | Freq: Three times a day (TID) | ORAL | Status: DC
  Administered 2013-11-09 – 2013-11-12 (×11): 1 g via ORAL
  Filled 2013-11-09 (×12): qty 1

## 2013-11-09 MED ORDER — TIZANIDINE HCL 4 MG PO TABS
8.0000 mg | ORAL_TABLET | Freq: Three times a day (TID) | ORAL | Status: DC
  Administered 2013-11-09 – 2013-11-12 (×8): 8 mg via ORAL
  Filled 2013-11-09 (×9): qty 2

## 2013-11-09 MED ORDER — HYDROMORPHONE HCL 2 MG PO TABS
2.0000 mg | ORAL_TABLET | ORAL | Status: DC | PRN
  Administered 2013-11-09 – 2013-11-12 (×8): 2 mg via ORAL
  Filled 2013-11-09 (×8): qty 1

## 2013-11-09 MED ORDER — DULOXETINE HCL 60 MG PO CPEP
60.0000 mg | ORAL_CAPSULE | Freq: Every day | ORAL | Status: DC
  Administered 2013-11-10 – 2013-11-12 (×3): 60 mg via ORAL
  Filled 2013-11-09 (×3): qty 1

## 2013-11-09 MED ORDER — DIAZEPAM 5 MG PO TABS
10.0000 mg | ORAL_TABLET | Freq: Four times a day (QID) | ORAL | Status: DC | PRN
  Administered 2013-11-09 – 2013-11-12 (×9): 10 mg via ORAL
  Filled 2013-11-09 (×9): qty 2

## 2013-11-09 MED ORDER — ACETAMINOPHEN 650 MG RE SUPP: 650.0000 mg | Freq: Four times a day (QID) | RECTAL | Status: DC | PRN

## 2013-11-09 MED ORDER — ENOXAPARIN SODIUM 60 MG/0.6ML ~~LOC~~ SOLN
60.0000 mg | Freq: Two times a day (BID) | SUBCUTANEOUS | Status: DC
  Administered 2013-11-09 – 2013-11-12 (×6): 60 mg via SUBCUTANEOUS
  Filled 2013-11-09 (×8): qty 0.6

## 2013-11-09 MED ORDER — VANCOMYCIN 50 MG/ML ORAL SOLUTION
125.0000 mg | Freq: Once | ORAL | Status: AC
  Administered 2013-11-09: 125 mg via ORAL
  Filled 2013-11-09: qty 2.5

## 2013-11-09 MED ORDER — BOOST PLUS PO LIQD
237.0000 mL | Freq: Three times a day (TID) | ORAL | Status: DC
  Administered 2013-11-10 – 2013-11-12 (×6): 237 mL via ORAL
  Filled 2013-11-09 (×10): qty 237

## 2013-11-09 MED ORDER — SUMATRIPTAN SUCCINATE 100 MG PO TABS: 100.0000 mg | ORAL_TABLET | ORAL | Status: DC | PRN

## 2013-11-09 MED ORDER — ONDANSETRON HCL 4 MG/2ML IJ SOLN
4.0000 mg | Freq: Four times a day (QID) | INTRAMUSCULAR | Status: DC | PRN
  Administered 2013-11-11: 4 mg via INTRAVENOUS

## 2013-11-09 MED ORDER — SODIUM CHLORIDE 0.9 % IV BOLUS (SEPSIS)
1000.0000 mL | Freq: Once | INTRAVENOUS | Status: AC
  Administered 2013-11-09: 1000 mL via INTRAVENOUS

## 2013-11-09 MED ORDER — POTASSIUM CHLORIDE CRYS ER 20 MEQ PO TBCR
40.0000 meq | EXTENDED_RELEASE_TABLET | Freq: Once | ORAL | Status: AC
  Administered 2013-11-09: 40 meq via ORAL
  Filled 2013-11-09: qty 2

## 2013-11-09 MED ORDER — TOPIRAMATE 100 MG PO TABS
100.0000 mg | ORAL_TABLET | Freq: Every day | ORAL | Status: DC
  Administered 2013-11-09 – 2013-11-11 (×3): 100 mg via ORAL
  Filled 2013-11-09 (×3): qty 1

## 2013-11-09 MED ORDER — POTASSIUM CHLORIDE IN NACL 20-0.9 MEQ/L-% IV SOLN
INTRAVENOUS | Status: DC
  Administered 2013-11-09 – 2013-11-10 (×3): via INTRAVENOUS
  Filled 2013-11-09 (×5): qty 1000

## 2013-11-09 MED ORDER — ADULT MULTIVITAMIN W/MINERALS CH
1.0000 | ORAL_TABLET | Freq: Every day | ORAL | Status: DC
  Administered 2013-11-10 – 2013-11-12 (×3): 1 via ORAL
  Filled 2013-11-09 (×3): qty 1

## 2013-11-09 MED ORDER — HYDROMORPHONE HCL 1 MG/ML IJ SOLN
1.0000 mg | Freq: Once | INTRAMUSCULAR | Status: AC
  Administered 2013-11-09: 1 mg via INTRAVENOUS
  Filled 2013-11-09: qty 1

## 2013-11-09 MED ORDER — SODIUM CHLORIDE 0.9 % IJ SOLN
3.0000 mL | Freq: Two times a day (BID) | INTRAMUSCULAR | Status: DC
  Administered 2013-11-12: 3 mL via INTRAVENOUS

## 2013-11-09 MED ORDER — ONDANSETRON HCL 4 MG PO TABS
4.0000 mg | ORAL_TABLET | Freq: Four times a day (QID) | ORAL | Status: DC | PRN
  Administered 2013-11-10 (×2): 4 mg via ORAL
  Filled 2013-11-09 (×2): qty 1

## 2013-11-09 MED ORDER — LEVOTHYROXINE SODIUM 75 MCG PO TABS
75.0000 ug | ORAL_TABLET | ORAL | Status: DC
  Administered 2013-11-11 – 2013-11-12 (×2): 75 ug via ORAL
  Filled 2013-11-09 (×2): qty 1

## 2013-11-09 MED ORDER — VANCOMYCIN 50 MG/ML ORAL SOLUTION
125.0000 mg | Freq: Four times a day (QID) | ORAL | Status: DC
  Administered 2013-11-10 – 2013-11-12 (×11): 125 mg via ORAL
  Filled 2013-11-09 (×13): qty 2.5

## 2013-11-09 MED ORDER — LETROZOLE 2.5 MG PO TABS
2.5000 mg | ORAL_TABLET | Freq: Every day | ORAL | Status: DC
  Administered 2013-11-10 – 2013-11-12 (×3): 2.5 mg via ORAL
  Filled 2013-11-09 (×3): qty 1

## 2013-11-09 MED ORDER — POTASSIUM CHLORIDE CRYS ER 20 MEQ PO TBCR
40.0000 meq | EXTENDED_RELEASE_TABLET | Freq: Two times a day (BID) | ORAL | Status: DC
  Administered 2013-11-09: 40 meq via ORAL
  Filled 2013-11-09: qty 2

## 2013-11-09 MED ORDER — ONDANSETRON HCL 4 MG/2ML IJ SOLN
4.0000 mg | Freq: Once | INTRAMUSCULAR | Status: AC
  Administered 2013-11-09: 4 mg via INTRAVENOUS
  Filled 2013-11-09: qty 2

## 2013-11-09 MED ORDER — ACETAMINOPHEN 325 MG PO TABS: 650.0000 mg | ORAL_TABLET | Freq: Four times a day (QID) | ORAL | Status: DC | PRN

## 2013-11-09 MED ORDER — POTASSIUM CHLORIDE 10 MEQ/100ML IV SOLN
10.0000 meq | INTRAVENOUS | Status: AC
  Administered 2013-11-09 (×2): 10 meq via INTRAVENOUS
  Filled 2013-11-09 (×3): qty 100

## 2013-11-09 MED ORDER — ZOLPIDEM TARTRATE 5 MG PO TABS: 5.0000 mg | ORAL_TABLET | Freq: Every evening | ORAL | Status: DC | PRN

## 2013-11-09 MED ORDER — OXYCODONE-ACETAMINOPHEN 5-325 MG PO TABS: 1.0000 | ORAL_TABLET | ORAL | Status: DC | PRN

## 2013-11-09 NOTE — ED Notes (Signed)
Dehydrated, sts hx of breast cancer and extensive mets

## 2013-11-09 NOTE — ED Provider Notes (Signed)
CSN: [id number]     Arrival date & time 11/09/13  1408 History   First MD Initiated Contact with Patient 11/09/13 1413     Chief Complaint  Patient presents with  . Diarrhea     (Consider location/radiation/quality/duration/timing/severity/associated sxs/prior Treatment) The history is provided by the patient.  Kristie Jackson is a 58 y.o. female hx of breast cancer s/p mets, C diff on flagyl here with diarrhea, nausea. Was diagnosed with C diff 6 weeks ago. Was on several courses of abx, most recently on flagyl. Hasn't been able to eat for the last 4 days, felt nauseated but no vomiting. No fevers. No abdominal pain. Since last night, had watery stools every 2 hrs. Had positive c diff 2 days ago in the office.    Past Medical History  Diagnosis Date  . Central hypothyroidism   . Hyperlipidemia   . Spinal cord injury   . Depression   . Cancer     spinal metastasis  . PONV (postoperative nausea and vomiting)   . Pulmonary embolism 08/2013  . C. difficile diarrhea    Past Surgical History  Procedure Laterality Date  . Laminectomy    . Abdominal hysterectomy     No family history on file. History  Substance Use Topics  . Smoking status: Current Every Day Smoker -- 1.00 packs/day for 40 years    Types: Cigarettes  . Smokeless tobacco: Never Used  . Alcohol Use: No   OB History   Grav Para Term Preterm Abortions TAB SAB Ect Mult Living                 Review of Systems  Gastrointestinal: Positive for nausea and diarrhea.  All other systems reviewed and are negative.     Allergies  Morphine and related  Home Medications   Prior to Admission medications   Medication Sig Start Date End Date Taking? Authorizing Provider  diazepam (VALIUM) 10 MG tablet Take 10 mg by mouth every 6 (six) hours as needed. For muscle spasms 07/10/13   Elwyn Lade, PA-C  DULoxetine (CYMBALTA) 60 MG capsule Take 60 mg by mouth daily.    Historical Provider, MD  enoxaparin (LOVENOX) 60  MG/0.6ML injection Inject 0.6 mLs (60 mg total) into the skin every 12 (twelve) hours. 09/05/13   [name], MD  esomeprazole (NEXIUM) 20 MG capsule Take 20 mg by mouth daily at 12 noon.    Historical Provider, MD  HYDROmorphone (DILAUDID) 2 MG tablet Take 1 tablet (2 mg total) by mouth every 4 (four) hours as needed for severe pain. 10/23/13   Aasim Marla Roe, MD  lactose free nutrition (BOOST PLUS) LIQD Take 237 mLs by mouth 3 (three) times daily with meals. 10/21/13   Annita Brod, MD  lapatinib (TYKERB) 250 MG tablet Take 5 tablets (1,250 mg total) by mouth daily. 10/21/13   Aasim Marla Roe, MD  letrozole [facility]) 2.5 MG tablet Take 1 tablet (2.5 mg total) by mouth daily. 09/05/13   Concha Norway, MD  levothyroxine (SYNTHROID, LEVOTHROID) 75 MCG tablet Take 75 mcg by mouth See admin instructions. Takes daily except Monday and Thursday    Historical Provider, MD  metroNIDAZOLE (FLAGYL) 500 MG tablet Take 1 tablet (500 mg total) by mouth 3 (three) times daily. 10/21/13   Annita Brod, MD  Multiple Vitamin (MULTIVITAMIN WITH MINERALS) TABS tablet Take 1 tablet by mouth daily.    Historical Provider, MD  Omega-3 Fatty Acids (OMEGA 3 PO) Take 1  capsule by mouth daily.    Historical Provider, MD  oxyCODONE-acetaminophen (PERCOCET/ROXICET) 5-325 MG per tablet Take 1-2 tablets by mouth every 4 (four) hours as needed for moderate pain or severe pain. 10/21/13   Annita Brod, MD  polyethylene glycol powder (GLYCOLAX/MIRALAX) powder  09/05/13   Historical Provider, MD  potassium chloride (KLOR-CON) 20 MEQ packet Take 20 mEq by mouth daily. 10/15/13   Robbie Lis, MD  rizatriptan (MAXALT) 10 MG tablet Take 10 mg by mouth as needed for migraine. May repeat in 2 hours if needed    Historical Provider, MD  sucralfate (CARAFATE) 1 G tablet Take 1 g by mouth 4 (four) times daily -  with meals and at bedtime.    Historical Provider, MD  tiZANidine (ZANAFLEX) 4 MG tablet Take 8 mg by mouth 3  (three) times daily.    Historical Provider, MD  topiramate (TOPAMAX) 100 MG tablet Take 100 mg by mouth at bedtime.    Historical Provider, MD   BP 126/74  Pulse 110  Temp(Src) 98.9 F (37.2 C) (Oral)  Resp 14  SpO2 96% Physical Exam  Nursing note and vitals reviewed. Constitutional: She is oriented to person, place, and time.  Dehydrated, MM dry   HENT:  Head: Normocephalic.  MM dry   Eyes: Conjunctivae and EOM are normal. Pupils are equal, round, and reactive to light.  Neck: Normal range of motion. Neck supple.  Cardiovascular: Regular rhythm and normal heart sounds.   Tachycardic   Pulmonary/Chest: Effort normal and breath sounds normal. No respiratory distress. She has no wheezes. She has no rales.  Abdominal: Soft. Bowel sounds are normal. She exhibits no distension. There is no tenderness.  Musculoskeletal: Normal range of motion. She exhibits no edema and no tenderness.  Neurological: She is alert and oriented to person, place, and time.  Skin: Skin is warm and dry.  Psychiatric: She has a normal mood and affect. Her behavior is normal. Judgment and thought content normal.    ED Course  Procedures (including critical care time) Labs Review Labs Reviewed  CBC WITH DIFFERENTIAL - Abnormal; Notable for the following:    WBC 11.9 (*)    RDW 16.5 (*)    Neutrophils Relative % 85 (*)    Neutro Abs 10.2 (*)    Lymphocytes Relative 6 (*)    All other components within normal limits  COMPREHENSIVE METABOLIC PANEL - Abnormal; Notable for the following:    Potassium 2.6 (*)    Glucose, Bld 115 (*)    Creatinine, Ser 0.38 (*)    Albumin 2.9 (*)    Total Bilirubin <0.2 (*)    Anion gap 17 (*)    All other components within normal limits  LIPASE, BLOOD - Abnormal; Notable for the following:    Lipase 5 (*)    All other components within normal limits  I-STAT CHEM 8, ED - Abnormal; Notable for the following:    Potassium 2.3 (*)    Creatinine, Ser 0.40 (*)    Calcium,  Ion 1.33 (*)    All other components within normal limits  CLOSTRIDIUM DIFFICILE BY PCR    Imaging Review No results found.   EKG Interpretation   Date/Time:  Sunday November 09 2013 14:11:02 EDT Ventricular Rate:  109 PR Interval:  151 QRS Duration: 92 QT Interval:  334 QTC Calculation: 450 R Axis:   88 Text Interpretation:  Sinus tachycardia Low voltage, precordial leads  Consider anterior infarct tachycardic new since previous  Confirmed by Darl Householder   MD, [place] ([id number]) on 11/09/2013 2:22:15 PM      MDM   Final diagnoses:  None    Kristie Jackson is a 58 y.o. female here with diarrhea. I think likely recurrent C diff. Will check labs, hydrate. Will likely need admission for dehydration from C diff.   3:35 PM K 2.6, supplemented. Given PO vanc. Will admit for hydration on tele (given hypokalemia).    Wandra Arthurs, MD 11/09/13 1535

## 2013-11-09 NOTE — ED Notes (Signed)
Per EMS cannot eat or drink,  Is wheelchair bound.

## 2013-11-09 NOTE — ED Notes (Signed)
Critical chem8 result given to Dr Darl Householder

## 2013-11-09 NOTE — H&P (Signed)
[facility] History and Physical  Kristie Jackson EVO:[id number] DOB: Jun 28, 1955 DOA: 11/09/2013  Referring physician:  D. Darl Householder PCP:  [name], MD   Chief Complaint:  Diarrhea, nausea  HPI:  The patient is a 58 y.o. year-old female with metastatic breast cancer to bone with multiple pathologic fractures, wheelchair bound from spinal cord injury many years ago, RLL pulmonary embolism on lovenox, newly discovered chronic systolic heart failure with EF of 40-45%, hypothyroidism, depression, unresolving C. Diff diarrhea, recurrent admissions for dehydration and diarrhea who presents with the same.  She was initially hospitalized in 08/2013 at which time she was found to have metastatic ER+, HER2+ breast cancer based on a biopsy of an iliac lesion.  She had multiple pathologic compression fractures at T1 and T5 and an L3 pathology retropulsed lesion with some spinal cord compression for which neurosurgery felt that surgery would not be helpful.  She was discharged and started on lapatinib and femara by Dr. [name], Oncology, and she completed palliative radiation in 09/2013.  She developed diarrhea which was initially attributed to side effect from chemo, however, she was hospitalized from 8/28 through 9/2 with diarrhea and dehydration and was found to have C. Diff diarrhea.  She was started on flagyl, but continued to have diarrhea at home so her PCP [name] called in an Rx for oral vancomycin.  She took about 2 days of oral vanc, but continued to have diarrhea so she was rehospitalized from 9/6 through 9/8.  At that time, she was started on oral vanc with IV flagyl, but her persistent diarrhea was attributed to her persistent use of PPI and not treatment failure.  Her PPI was discontinued and she was discharged on flagyl.  Her diarrhea started to resolve.  She was seen in follow up by Oncologist Dr. [name] who also stopped her tykerb (lapatinib), which may have been exacerbating her diarrhea.  He  was going to change to IV herceptin, however, her pre-treatment ECHO demonstrated newly depressed EF to 40-45% so she was instead referred to cardiology.  About three days ago, despite compliance with flagyl, she developed foul-smelling green nonbloody watery stools.  Initially they were less frequent, but over the last 24 hours, they have occurred about every 2 hours. She has had nausea with inability to tolerate liquids and drank a total of three cups of fluid yesterday.  Denies vomiting.  She called EMS today because her symptoms were worsening.  Denies fevers.  Denies other recent medication changes.    In the ER, VSS notable for tachycardia to 120s that has improved with IVF, stable BP.  Labs notable for potassium 2.3 with diffuse ST segment depression.  She was unable to keep fluids down and continued to have diarrhea so she is being admitted for electrolyte repletion and rehydration and tx of C. Diff diarrhea.    Review of Systems:  General:  Denies fevers, chills, weight loss or gain HEENT:  Denies changes to hearing and vision, rhinorrhea, sinus congestion, sore throat CV:  Denies chest pain and palpitations, chronic lower extremity edema.  PULM:  Denies SOB, wheezing, cough.   GI:  Per HPI GU:  Denies dysuria, frequency, urgency.  Voided once in last 24 hours. ENDO:  Denies polyuria, polydipsia.   HEME:  Denies hematemesis, blood in stools, melena, abnormal bruising or bleeding.  LYMPH:  Denies lymphadenopathy.   MSK:  Chronic arthralgias, myalgias and bone pain DERM:  Denies skin rash or ulcer.   NEURO:  Chronic lower extremity  weakness due to spinal cord injury.  No change to ability to wiggle toes and ankles.  Denies focal numbness, slurred speech, confusion, facial droop.  PSYCH:  Denies anxiety and depression.    Past Medical History  Diagnosis Date  . Central hypothyroidism   . Hyperlipidemia   . Spinal cord injury   . Depression   . Cancer     spinal metastasis  . PONV  (postoperative nausea and vomiting)   . Pulmonary embolism 08/2013  . C. difficile diarrhea    Past Surgical History  Procedure Laterality Date  . Laminectomy    . Abdominal hysterectomy     Social History:  reports that she has been smoking Cigarettes.  She has a 40 pack-year smoking history. She has never used smokeless tobacco. She reports that she does not drink alcohol or use illicit drugs. Wheelchair bound.  Incontinent of urine.  PT/OT.  HH aid.    Allergies  Allergen Reactions  . Morphine And Related Nausea And Vomiting    Family History  Problem Relation Age of Onset  . Breast cancer Neg Hx   . Uterine cancer Mother     consider strain of ovarian ca     Prior to Admission medications   Medication Sig Start Date End Date Taking? Authorizing Provider  diazepam (VALIUM) 10 MG tablet Take 10 mg by mouth every 6 (six) hours as needed. For muscle spasms 07/10/13   Elwyn Lade, PA-C  DULoxetine (CYMBALTA) 60 MG capsule Take 60 mg by mouth daily.    Historical Provider, MD  enoxaparin (LOVENOX) 60 MG/0.6ML injection Inject 0.6 mLs (60 mg total) into the skin every 12 (twelve) hours. 09/05/13   [name], MD  esomeprazole (NEXIUM) 20 MG capsule Take 20 mg by mouth daily at 12 noon.    Historical Provider, MD  HYDROmorphone (DILAUDID) 2 MG tablet Take 1 tablet (2 mg total) by mouth every 4 (four) hours as needed for severe pain. 10/23/13   Aasim Marla Roe, MD  lactose free nutrition (BOOST PLUS) LIQD Take 237 mLs by mouth 3 (three) times daily with meals. 10/21/13   Annita Brod, MD  lapatinib (TYKERB) 250 MG tablet Take 5 tablets (1,250 mg total) by mouth daily. 10/21/13   Aasim Marla Roe, MD  letrozole [facility]) 2.5 MG tablet Take 1 tablet (2.5 mg total) by mouth daily. 09/05/13   Concha Norway, MD  levothyroxine (SYNTHROID, LEVOTHROID) 75 MCG tablet Take 75 mcg by mouth See admin instructions. Takes daily except Monday and Thursday    Historical Provider, MD   metroNIDAZOLE (FLAGYL) 500 MG tablet Take 1 tablet (500 mg total) by mouth 3 (three) times daily. 10/21/13   Annita Brod, MD  Multiple Vitamin (MULTIVITAMIN WITH MINERALS) TABS tablet Take 1 tablet by mouth daily.    Historical Provider, MD  Omega-3 Fatty Acids (OMEGA 3 PO) Take 1 capsule by mouth daily.    Historical Provider, MD  oxyCODONE-acetaminophen (PERCOCET/ROXICET) 5-325 MG per tablet Take 1-2 tablets by mouth every 4 (four) hours as needed for moderate pain or severe pain. 10/21/13   Annita Brod, MD  polyethylene glycol powder (GLYCOLAX/MIRALAX) powder  09/05/13   Historical Provider, MD  potassium chloride (KLOR-CON) 20 MEQ packet Take 20 mEq by mouth daily. 10/15/13   Robbie Lis, MD  rizatriptan (MAXALT) 10 MG tablet Take 10 mg by mouth as needed for migraine. May repeat in 2 hours if needed    Historical Provider, MD  sucralfate (  CARAFATE) 1 G tablet Take 1 g by mouth 4 (four) times daily -  with meals and at bedtime.    Historical Provider, MD  tiZANidine (ZANAFLEX) 4 MG tablet Take 8 mg by mouth 3 (three) times daily.    Historical Provider, MD  topiramate (TOPAMAX) 100 MG tablet Take 100 mg by mouth at bedtime.    Historical Provider, MD   Physical Exam: Filed Vitals:   11/09/13 1600 11/09/13 1653 11/09/13 2226 11/10/13 0352  BP: 119/93 109/75 119/73 109/62  Pulse: 97 85 74 84  Temp:  98.3 F (36.8 C) 97.6 F (36.4 C) 98 F (36.7 C)  TempSrc:  Oral Oral Oral  Resp: _0 Height:  5' 8" (1.727 m)    Weight:  54.2 kg (119 lb 7.8 oz)    SpO2: 98% 99% 100% 98%     General:  Thin WF, NAD, dry lips and sunken eyes  Eyes:  PERRL, anicteric, non-injected.  ENT:  Nares clear.  OP clear, non-erythematous without plaques or exudates.  Dry MM.  Neck:  Supple without TM or JVD.    Lymph:  No cervical, supraclavicular, or submandibular LAD.  Cardiovascular:  Tachycardic RR, normal S1, S2, without m/r/g.  2+ pulses, warm extremities  Respiratory:  CTA  bilaterally without increased WOB.  Abdomen:  NABS.  Soft, ND/NT.    Skin:  No rashes or focal lesions.  Musculoskeletal:  Decreased bulk and tone lower extremities.  No LE edema.  Psychiatric:  A & O x 4.  Appropriate affect.    Neurologic:  CN 3-12 intact.  5/5 strength upper extremities.  2/5 lower extremity hip flexion, knee flex/extension, but 4/5 ankle bilaterally and able to wiggle toes.    Labs on Admission:  Basic Metabolic Panel:  Recent Labs Lab 11/09/13 1433 11/09/13 1515 11/09/13 1547  NA 139 139  --   K 2.6* 2.3*  --   CL 101 107  --   CO2 21  --   --   GLUCOSE 115* 99  --   BUN 9 7  --   CREATININE 0.38* 0.40*  --   CALCIUM 10.2  --   --   MG  --   --  1.9   Liver Function Tests:  Recent Labs Lab 11/09/13 1433  AST 30  ALT 13  ALKPHOS 104  BILITOT <0.2*  PROT 7.0  ALBUMIN 2.9*    Recent Labs Lab 11/09/13 1433  LIPASE 5*   No results found for this basename: AMMONIA,  in the last 168 hours CBC:  Recent Labs Lab 11/09/13 1433 11/09/13 1515  WBC 11.9*  --   NEUTROABS 10.2*  --   HGB 13.3 13.9  HCT 40.0 41.0  MCV 98.0  --   PLT 358  --    Cardiac Enzymes: No results found for this basename: CKTOTAL, CKMB, CKMBINDEX, TROPONINI,  in the last 168 hours  BNP (last 3 results) No results found for this basename: PROBNP,  in the last 8760 hours CBG: No results found for this basename: GLUCAP,  in the last 168 hours  Radiological Exams on Admission: No results found.  EKG: Independently reviewed. Sinus tach with diffuse ST segment depression, possible small Q-waves in inferior leads   Assessment/Plan Active Problems:   Hypokalemia   Recurrent Clostridium difficile diarrhea  ---  Recurrent diarrhea despite compliance with flagyl.  Could represent C. Diff recurrence with resistance to flagyl or other bacterial, viral, or parasitic diarrhea superimposed  on her previous C. Diff.  Since it has only been a few weeks, even if her C. Diff  was adequately treated, her PCR may still be positive.   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  S/p palliative XRT. -  Continue femara -  Herceptin on hold due to newly discovered heart failuret -  Continue percocet for moderate pain and dilaudid for severe pain -  Continue valium -  Will clarify with patient if she takes gabapentin 1258m TID.  Not on her home med list but mentioned in Dr. SSan Jettynote  Newly discovered systolic heart failure with EF 40-45%, asymptomatic -  Consider cardiology consult for symptoms  -  Trend BP and if remains stable, would recommend adding very low dose ACEI and BB  Severe protein calorie malnutrition -  Regular diet with supplements  RLL segmental PE, stable on RA, continue Lovenox 60 mg q 12 hours.   Hypothyroidism.  Could consider hyperthyroidism as cause of diarrhea, however, she does not have tremor or tachycardia and checking TSH during acute illness likely to be inaccurate. -  Continue Levothyroxine 47 mcg daily except Monday and Thursday.   History of central cord injury. After a horse fall in 1995.  Also with L3 compression fracture with chronic Bladder/ Bowel incontinence  -  WC bound and has caretaker at home  Depression, stable, continue cymbalta 60 mg daily.   Hypokalemia due to diarrhea -  Magnesium:  1.9 -  Continue oral and IV potassium supplementation and repeat in AM  Diet:  regular Access:  PIV IVF:  yes Proph:  Therapeutic lovenox  Code Status: Full Family Communication: patient alone Disposition Plan: Admit to telemetry  Time spent: 60 min SJanece Canterbury[facility] Pager [phone] If 7PM-7AM, please contact night-coverage [email or web address] Password [email or web address] 11/10/2013, 7:02 AM

## 2013-11-10 DIAGNOSIS — E43 Unspecified severe protein-calorie malnutrition: Secondary | ICD-10-CM

## 2013-11-10 DIAGNOSIS — E039 Hypothyroidism, unspecified: Secondary | ICD-10-CM

## 2013-11-10 LAB — BASIC METABOLIC PANEL
Anion gap: 15 (ref 5–15)
BUN: 10 mg/dL (ref 6–23)
CALCIUM: 10.2 mg/dL (ref 8.4–10.5)
CO2: 18 mEq/L — ABNORMAL LOW (ref 19–32)
Chloride: 110 mEq/L (ref 96–112)
Creatinine, Ser: 0.41 mg/dL — ABNORMAL LOW (ref 0.50–1.10)
GLUCOSE: 67 mg/dL — AB (ref 70–99)
POTASSIUM: 3.9 meq/L (ref 3.7–5.3)
SODIUM: 143 meq/L (ref 137–147)

## 2013-11-10 LAB — CBC
HCT: 34.5 % — ABNORMAL LOW (ref 36.0–46.0)
Hemoglobin: 11.1 g/dL — ABNORMAL LOW (ref 12.0–15.0)
MCH: 32.3 pg (ref 26.0–34.0)
MCHC: 32.2 g/dL (ref 30.0–36.0)
MCV: 100.3 fL — AB (ref 78.0–100.0)
Platelets: 285 10*3/uL (ref 150–400)
RBC: 3.44 MIL/uL — AB (ref 3.87–5.11)
RDW: 16.5 % — AB (ref 11.5–15.5)
WBC: 6.3 10*3/uL (ref 4.0–10.5)

## 2013-11-10 LAB — CLOSTRIDIUM DIFFICILE BY PCR: Toxigenic C. Difficile by PCR: NEGATIVE

## 2013-11-10 MED ORDER — GABAPENTIN 400 MG PO CAPS
1200.0000 mg | ORAL_CAPSULE | Freq: Three times a day (TID) | ORAL | Status: DC
  Administered 2013-11-10 – 2013-11-12 (×7): 1200 mg via ORAL
  Filled 2013-11-10 (×10): qty 3

## 2013-11-10 NOTE — Progress Notes (Addendum)
[facility] PROGRESS NOTE  Kristie Jackson EZM:[id number] DOB: 10-25-1955 DOA: 11/09/2013 PCP: [name], MD  Assessment/Plan  Recurrent diarrhea despite compliance with flagyl. Could represent C. Diff recurrence with resistance to flagyl or other bacterial, viral, or parasitic diarrhea superimposed on her previous C. Diff.  -  C. 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 S/p palliative XRT.  - Continue femara  - Herceptin on hold due to newly discovered heart failuret  - Continue percocet for moderate pain and dilaudid for severe pain  - Continue valium and gabapentin 1273m TID  Newly discovered systolic heart failure with EF 40-45%, asymptomatic  - Consider cardiology consult for symptoms  - Trend BP and if remains stable, would recommend adding very low dose ACEI and BB  - daily weights -  Unable to record strict I/o due to incontinence  Severe protein calorie malnutrition  - Regular diet with supplements   RLL segmental PE, stable on RA, continue Lovenox 60 mg q 12 hours.   Hypothyroidism. Could consider hyperthyroidism as cause of diarrhea, however, she does not have tremor or tachycardia and checking TSH during acute illness likely to be inaccurate.  - Continue Levothyroxine daily except Monday and Thursday.   History of central cord injury. After a horse fall in 1995. Also with L3 compression fracture with chronic Bladder  Bowel incontinence  - WC bound and has caretaker at home   Depression, stable, continue cymbalta 60 mg daily.   Hypokalemia due to diarrhea, resolved with oral and IV supplementation - Magnesium: 1.9  - Tele:  NSR, okay to d/c telemetry  [place] metabolic acidosis likely secondary to diarrhea.   Trend.  Leukocytosis, likely due to dehydration -  Trended down with IVF  Anemia of chronic disease, hemoglobin trended down likely secondary to hemodilution  Diet: regular  Access: PIV  IVF: yes  Proph: Therapeutic lovenox   Code Status: Full  Family Communication: patient alone  Disposition Plan:  Will d/c telemetry if potassium above 3   Consultants:  None  Procedures:  none  Antibiotics:  Oral vancomycin 9/27 >>   HPI/Subjective:  Had 4 large watery BMs overnight.  Denies vomiting but has persistent nausea.     Objective: Filed Vitals:   11/09/13 1600 11/09/13 1653 11/09/13 2226 11/10/13 0352  BP: 119/93 109/75 119/73 109/62  Pulse: 97 85 74 84  Temp:  98.3 F (36.8 C) 97.6 F (36.4 C) 98 F (36.7 C)  TempSrc:  Oral Oral Oral  Resp: _0 Height:  _1  (1.727 m)    Weight:  54.2 kg (119 lb 7.8 oz)    SpO2: 98% 99% 100% 98%    Intake/Output Summary (Last 24 hours) at 11/10/13 0809 Last data filed at 11/10/13 0700  Gross per 24 hour  Intake 1526.67 ml  Output      0 ml  Net 1526.67 ml   Filed Weights   11/09/13 1653  Weight: 54.2 kg (119 lb 7.8 oz)    Exam:   General:  WF, No acute distress  HEENT:  NCAT, MMM  Cardiovascular:  RRR, nl S1, S2 no mrg, 2+ pulses, warm extremities  Respiratory:  CTAB, no increased WOB  Abdomen:   NABS, soft, NT/ND  MSK:  Decreased tone and bulk, no LEE  Neuro:  Lower extremity weakness, upper extremities 5/5  Data Reviewed: Basic Metabolic Panel:  Recent Labs Lab 11/09/13 1433 11/09/13 1515 11/09/13 1547  NA 139 139  --   K 2.6* 2.3*  --   CL 101 107  --   CO2 21  --   --   GLUCOSE 115* 99  --   BUN 9 7  --   CREATININE 0.38* 0.40*  --   CALCIUM 10.2  --   --   MG  --   --  1.9   Liver Function Tests:  Recent Labs Lab 11/09/13 1433  AST 30  ALT 13  ALKPHOS 104  BILITOT <0.2*  PROT 7.0  ALBUMIN 2.9*    Recent Labs Lab 11/09/13 1433  LIPASE 5*   No results found for  this basename: AMMONIA,  in the last 168 hours CBC:  Recent Labs Lab 11/09/13 1433 11/09/13 1515 11/10/13 0749  WBC 11.9*  --  6.3  NEUTROABS 10.2*  --   --   HGB 13.3 13.9 11.1*  HCT 40.0 41.0 34.5*  MCV 98.0  --  100.3*  PLT 358  --  285   Cardiac Enzymes: No results found for this basename: CKTOTAL, CKMB, CKMBINDEX, TROPONINI,  in the last 168 hours BNP (last 3 results) No results found for this basename: PROBNP,  in the last 8760 hours CBG: No results found for this basename: GLUCAP,  in the last 168 hours  No results found for this or any previous visit (from the past 240 hour(s)).   Studies: No results found.  Scheduled Meds: . DULoxetine  60 mg Oral Daily  . enoxaparin  60 mg Subcutaneous Q12H  . gabapentin  1,200 mg Oral TID  . lactose free nutrition  237 mL Oral TID WC  . letrozole  2.5 mg Oral Daily  . [START ON 11/11/2013] levothyroxine  75 mcg Oral Once per day on Sun Tue Wed Fri Sat  . multivitamin with minerals  1 tablet Oral Daily  . potassium chloride  40 mEq Oral BID  . sodium chloride  3 mL Intravenous Q12H  . sucralfate  1 g Oral TID WC & HS  . tiZANidine  8 mg Oral TID  . topiramate  100 mg Oral QHS  . vancomycin  125 mg Oral 4 times per day   Continuous Infusions: . 0.9 % NaCl with KCl 20 mEq / L 100 mL/hr at 11/10/13 3614    Active Problems:   Hypokalemia   Recurrent Clostridium difficile diarrhea    Time spent: 30 min    Michelyn Scullin, [facility] Pager [phone]. If 7PM-7AM, please contact night-coverage at [email or web address], password [facility] 11/10/2013, 8:09 AM  LOS: 1 day

## 2013-11-10 NOTE — Care Management Note (Addendum)
    Page 1 of 1   11/12/2013     11:16:02 AM CARE MANAGEMENT NOTE 11/12/2013  Patient:  KENNADY, ZIMMERLE   Account Number:  [id number]  Date Initiated:  11/10/2013  Documentation initiated by:  Dessa Phi  Subjective/Objective Assessment:   64 [place].READMIT-9/6-9/8-C DIFF.     Action/Plan:   FROM HOME.   Anticipated DC Date:  11/12/2013   Anticipated DC Plan:  [place]  CM consult      Choice offered to / List presented to:  C-1 Patient        [place] arranged  HH-1 RN  [place].   Status of service:  Completed, signed off Medicare Important Message given?  YES (If response is "NO", the following Medicare IM given date fields will be blank) Date Medicare IM given:  11/12/2013 Medicare IM given by:  [facility] Date Additional Medicare IM given:   Additional Medicare IM given by:    Discharge Disposition:  [place]  Per UR Regulation:  Reviewed for med. necessity/level of care/duration of stay  If discussed at Long Length of Stay Meetings, dates discussed:    Comments:  11/12/13 Yuna Pizzolato RN,BSN NCM [phone] PATIENT USED [facility] IN PAST AGREE TO USE THEM AGAIN.TC [facility] KRISTEN AWARE OF HHC ORDERS, & D/C.PATIENT HAS 24HR [facility]-PRIVATE DUTY SERVICE ALREADY SET UP SHE WILL CONTACT THEM TO RESUME SERVICES TODAY.WILL NEED AMBULANCE TRANSP.NO FURTHER D/C NEEDS.  11/10/13 Jahmel Flannagan RN,BSN NCM [phone] NO ANTICIPATED D/C NEEDS.

## 2013-11-10 NOTE — Progress Notes (Signed)
INITIAL NUTRITION ASSESSMENT  DOCUMENTATION CODES Per approved criteria  -Severe malnutrition in the context of chronic illness -Underweight  Pt meets criteria for severe MALNUTRITION in the context of chronic illness as evidenced by 7.8% body weight loss in < one month, PO intake < 75% for > one month, severe muscle wasting and subcutaneous fat loss  INTERVENTION: -Recommend Boost Plus TID, each supplement provides 360 kcal, 14 gram protein -Encouraged intake of bland, easy to tolerate snacks to add as tolerated; RD to follow up to assess pt's readiness for solid foods -Consider addition of probiotic for gut health -Will continue to monitor  NUTRITION DIAGNOSIS: Inadequate oral intake related to nausea/loose stools as evidenced by PO intake < 75%, ongoing weight loss.   Goal: Pt to meet >/= 90% of their estimated nutrition needs    Monitor:  Total protein/energy intake, labs, weights, GI profile  Reason for Assessment: MST/Underweight BMI/Low [place]  58 y.o. female  Admitting Dx: <principal problem not specified>  ASSESSMENT: About three days ago, despite compliance with flagyl, she developed foul-smelling green nonbloody watery stools. Initially they were less frequent, but over the last 24 hours, they have occurred about every 2 hours. She has had nausea with inability to tolerate liquids and drank a total of three cups of fluid yesterday. Denies vomiting. She called EMS today because her symptoms were worsening. Denies fevers. Denies other recent medication changes.   -Since previous d/c around 10/23/2013, pt reported one week of good appetite. Was able to tolerate solid foods, such as mashed potatoes, and had been consuming 2-3 Boost Plus daily; however has been unable to tolerate any solids and minimal liquids for past one week -Diet recall indicates pt has only starting tolerating gingerale today w/out emesis. Has Boost Plus ordered, but has not yet consumed as pt was  conservatively waiting to tolerate clears before trialing Boost -Endorsed weight loss. Has lost approximately 10 lbs in < one month (7.8% body weight, severe for time frame), and overall 50-60 lbs weight loss in past 4 months r/t recurrent C.diff, as well as chemo and radiation nutrition side effects. -Offered alternative nutrition supplements and reviewed bland/easy to tolerate snacks to incorporate into diet to assist in weight maintenance; however pt declined at this time and would consider as her nausea improves -May benefit from addition of probiotics for overall gut health and integrity, continue to recommend MVI  Height: Ht Readings from Last 1 Encounters:  11/09/13 5\' 8"  (1.727 m)    Weight: Wt Readings from Last 1 Encounters:  11/09/13 119 lb 7.8 oz (54.2 kg)    Ideal Body Weight: 140 lbs  % Ideal Body Weight: 86^  Wt Readings from Last 10 Encounters:  11/09/13 119 lb 7.8 oz (54.2 kg)  10/23/13 128 lb (58.06 kg)  10/21/13 126 lb 1.7 oz (57.2 kg)  10/10/13 120 lb (54.432 kg)  09/05/13 137 lb 2 oz (62.2 kg)  07/09/13 150 lb (68.04 kg)    Usual Body Weight: 180 lbs   % Usual Body Weight: 67%  BMI:  Body mass index is 18.17 kg/(m^2). Underweight  Estimated Nutritional Needs: Kcal: 1700-1900 Protein: 80-90 gram Fluid: >/=1900 ml/daily  Skin: WDL  Diet Order: General  EDUCATION NEEDS: -No education needs identified at this time   Intake/Output Summary (Last 24 hours) at 11/10/13 1148 Last data filed at 11/10/13 0700  Gross per 24 hour  Intake 1526.67 ml  Output      0 ml  Net 1526.67 ml  Last BM: 9/28   Labs:   Recent Labs Lab 11/09/13 1433 11/09/13 1515 11/09/13 1547 11/10/13 0749  NA 139 139  --  143  K 2.6* 2.3*  --  3.9  CL 101 107  --  110  CO2 21  --   --  18*  BUN 9 7  --  10  CREATININE 0.38* 0.40*  --  0.41*  CALCIUM 10.2  --   --  10.2  MG  --   --  1.9  --   GLUCOSE 115* 99  --  67*    CBG (last 3)  No results found for  this basename: GLUCAP,  in the last 72 hours  Scheduled Meds: . DULoxetine  60 mg Oral Daily  . enoxaparin  60 mg Subcutaneous Q12H  . gabapentin  1,200 mg Oral TID  . lactose free nutrition  237 mL Oral TID WC  . letrozole  2.5 mg Oral Daily  . [START ON 11/11/2013] levothyroxine  75 mcg Oral Once per day on Sun Tue Wed Fri Sat  . multivitamin with minerals  1 tablet Oral Daily  . sodium chloride  3 mL Intravenous Q12H  . sucralfate  1 g Oral TID WC & HS  . tiZANidine  8 mg Oral TID  . topiramate  100 mg Oral QHS  . vancomycin  125 mg Oral 4 times per day    Continuous Infusions: . 0.9 % NaCl with KCl 20 mEq / L 50 mL/hr at 11/10/13 4970    Past Medical History  Diagnosis Date  . Central hypothyroidism   . Hyperlipidemia   . Spinal cord injury   . Depression   . Cancer     spinal metastasis  . PONV (postoperative nausea and vomiting)   . Pulmonary embolism 08/2013  . C. difficile diarrhea     Past Surgical History  Procedure Laterality Date  . Laminectomy    . Abdominal hysterectomy      Atlee Abide Redby RD LDN Clinical Dietitian [phone]

## 2013-11-11 LAB — BASIC METABOLIC PANEL WITH GFR
Anion gap: 16 — ABNORMAL HIGH (ref 5–15)
BUN: 6 mg/dL (ref 6–23)
CO2: 19 meq/L (ref 19–32)
Calcium: 10.2 mg/dL (ref 8.4–10.5)
Chloride: 105 meq/L (ref 96–112)
Creatinine, Ser: 0.42 mg/dL — ABNORMAL LOW (ref 0.50–1.10)
GFR calc Af Amer: >90 mL/min
GFR calc non Af Amer: >90 mL/min
Glucose, Bld: 61 mg/dL — ABNORMAL LOW (ref 70–99)
Potassium: 3.3 meq/L — ABNORMAL LOW (ref 3.7–5.3)
Sodium: 140 meq/L (ref 137–147)

## 2013-11-11 LAB — CBC
HCT: 33.6 % — ABNORMAL LOW (ref 36.0–46.0)
Hemoglobin: 10.9 g/dL — ABNORMAL LOW (ref 12.0–15.0)
MCH: 32.2 pg (ref 26.0–34.0)
MCHC: 32.4 g/dL (ref 30.0–36.0)
MCV: 99.1 fL (ref 78.0–100.0)
PLATELETS: 307 10*3/uL (ref 150–400)
RBC: 3.39 MIL/uL — ABNORMAL LOW (ref 3.87–5.11)
RDW: 16.2 % — AB (ref 11.5–15.5)
WBC: 4.1 10*3/uL (ref 4.0–10.5)

## 2013-11-11 LAB — OVA AND PARASITE EXAMINATION: Special Requests: NORMAL

## 2013-11-11 LAB — MAGNESIUM: Magnesium: 1.6 mg/dL (ref 1.5–2.5)

## 2013-11-11 MED ORDER — POTASSIUM CHLORIDE CRYS ER 20 MEQ PO TBCR
40.0000 meq | EXTENDED_RELEASE_TABLET | Freq: Once | ORAL | Status: AC
  Administered 2013-11-11: 40 meq via ORAL
  Filled 2013-11-11: qty 2

## 2013-11-11 MED ORDER — ONDANSETRON HCL 4 MG PO TABS: 8.0000 mg | ORAL_TABLET | Freq: Four times a day (QID) | ORAL | Status: DC | PRN

## 2013-11-11 MED ORDER — POTASSIUM CHLORIDE CRYS ER 20 MEQ PO TBCR
20.0000 meq | EXTENDED_RELEASE_TABLET | Freq: Every day | ORAL | Status: DC
  Administered 2013-11-11 – 2013-11-12 (×2): 20 meq via ORAL
  Filled 2013-11-11 (×2): qty 1

## 2013-11-11 MED ORDER — PROMETHAZINE HCL 25 MG/ML IJ SOLN: 12.5000 mg | Freq: Four times a day (QID) | INTRAMUSCULAR | Status: DC | PRN

## 2013-11-11 MED ORDER — CARVEDILOL 3.125 MG PO TABS
3.1250 mg | ORAL_TABLET | Freq: Two times a day (BID) | ORAL | Status: DC
  Administered 2013-11-11 – 2013-11-12 (×2): 3.125 mg via ORAL
  Filled 2013-11-11 (×2): qty 1

## 2013-11-11 MED ORDER — KCL IN DEXTROSE-NACL 20-5-0.45 MEQ/L-%-% IV SOLN
INTRAVENOUS | Status: DC
  Filled 2013-11-11: qty 1000

## 2013-11-11 MED ORDER — ONDANSETRON 8 MG/NS 50 ML IVPB
8.0000 mg | Freq: Four times a day (QID) | INTRAVENOUS | Status: DC | PRN
  Filled 2013-11-11: qty 8

## 2013-11-11 NOTE — Progress Notes (Signed)
NUTRITION FOLLOW UP  Intervention:   -Recommend MagicCup for HS nourishment -Reviewed [facility] nourishment options, and encouraged intake of snacks between meals -Continue with Boost Plus TID -Discussed diarrhea nutrition therapy-probiotics, fiber-binding foods, fluids -RD to continue to monitor  Nutrition Dx:   Inadequate oral intake related to nausea/loose stools as evidenced by PO intake < 75%, ongoing weight loss.    Goal:   Pt to meet >/= 90% of their estimated nutrition needs; progressing  Monitor:   Total protein/energy intake, labs, weights, GI profile, supplement/snack tolerance  Assessment:   9/28: -Since previous d/c around 10/23/2013, pt reported one week of good appetite. Was able to tolerate solid foods, such as mashed potatoes, and had been consuming 2-3 Boost Plus daily; however has been unable to tolerate any solids and minimal liquids for past one week  -Diet recall indicates pt has only starting tolerating gingerale today w/out emesis. Has Boost Plus ordered, but has not yet consumed as pt was conservatively waiting to tolerate clears before trialing Boost  -Endorsed weight loss. Has lost approximately 10 lbs in < one month (7.8% body weight, severe for time frame), and overall 50-60 lbs weight loss in past 4 months r/t recurrent C.diff, as well as chemo and radiation nutrition side effects.  -Offered alternative nutrition supplements and reviewed bland/easy to tolerate snacks to incorporate into diet to assist in weight maintenance; however pt declined at this time and would consider as her nausea improves  -May benefit from addition of probiotics for overall gut health and integrity  9/29: -Pt reported some improvement with nausea with assistance from anti-emetics. Was going to trial some solid foods for lunch and assess tolerance (chicken soup, mashed sweet potatoes, banana) -Has been tolerating Boost Plus. Tried [employer] earlier this morning, but did not care for  taste.  -Pt verbalized understanding in the importance of nutrition and was willing to consume small snacks between meals. Reviewed possible snack options, and provided pt with nourishment list. Encouraged intake of fiber binding foods to assist with loose stools -Agreeable to trial MagicCup for 8PM snack, relayed information to NT -Discussed probiotics for gut health, pt reported taking an encapsulated form regularly that proposed mechanism allows it to prevent degradation by chyme -RD to follow up with pt to assess snack/supplement tolerance; will provide appropriate resources if pt would like to continue with MagicCup supplement upon d/c.  Height: Ht Readings from Last 1 Encounters:  11/09/13 5\' 8"  (1.727 m)    Weight Status:   Wt Readings from Last 1 Encounters:  11/11/13 121 lb 6.4 oz (55.067 kg)    Re-estimated needs:  Kcal: 1700-1900  Protein: 80-90 gram  Fluid: >/=1900 ml/daily   Skin: WDL  Diet Order: General   Intake/Output Summary (Last 24 hours) at 11/11/13 1444 Last data filed at 11/11/13 0700  Gross per 24 hour  Intake 816.66 ml  Output      1 ml  Net 815.66 ml    Last BM: 9/29   Labs:   Recent Labs Lab 11/09/13 1433 11/09/13 1515 11/09/13 1547 11/10/13 0749 11/11/13 0350  NA 139 139  --  143 140  K 2.6* 2.3*  --  3.9 3.3*  CL 101 107  --  110 105  CO2 21  --   --  18* 19  BUN 9 7  --  10 6  CREATININE 0.38* 0.40*  --  0.41* 0.42*  CALCIUM 10.2  --   --  10.2 10.2  MG  --   --  1.9  --  1.6  GLUCOSE 115* 99  --  67* 61*    CBG (last 3)  No results found for this basename: GLUCAP,  in the last 72 hours  Scheduled Meds: . carvedilol  3.125 mg Oral BID WC  . DULoxetine  60 mg Oral Daily  . enoxaparin  60 mg Subcutaneous Q12H  . gabapentin  1,200 mg Oral TID  . lactose free nutrition  237 mL Oral TID WC  . letrozole  2.5 mg Oral Daily  . levothyroxine  75 mcg Oral Once per day on Sun Tue Wed Fri Sat  . multivitamin with minerals  1  tablet Oral Daily  . potassium chloride  20 mEq Oral Daily  . sodium chloride  3 mL Intravenous Q12H  . sucralfate  1 g Oral TID WC & HS  . tiZANidine  8 mg Oral TID  . topiramate  100 mg Oral QHS  . vancomycin  125 mg Oral 4 times per day    Continuous Infusions:   Atlee Abide MS RD LDN Clinical Dietitian [phone]

## 2013-11-11 NOTE — Progress Notes (Addendum)
[facility] PROGRESS NOTE  Kristie Jackson LFY:[id number] DOB: [birth year] DOA: 11/09/2013 PCP: [name], MD  Brief Summary  The patient is a 58 y.o. 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 Diff diarrhea who presented with diarrhea and dehydration.  She was diagnosed with ER+, HER2+ breast cancer in 08/2013 and was found to have multiple pathologic compression fractures at T1, T5, and L3 with some spinal cord compression at L3.  She was started on lapatinib and femara by Dr. [name], Oncology, and she completed palliative radiation in 09/2013.  She developed diarrhea which was initially attributed to chemo, however, she was hospitalized from 8/28 through 9/2 with diarrhea and was found to have C. Diff diarrhea. She started flagyl, but continued to have diarrhea at home so her PCP, [name], prescribed oral vancomycin. She took about 2 days of oral vanc, but continued to have diarrhea so she was rehospitalized from 9/6 through 9/8. At that time, she was started on oral vanc with IV flagyl, but her persistent diarrhea was attributed to her continued use of PPI and her tykerb (lapatinib).  These were stopped and she was discharged on flagyl. Her diarrhea started to resolve.  Dr. [name] followed up and prior to starting IV herceptin, he obtained a pre-treatment ECHO which demonstrated newly depressed EF to 40-45%.  She has been referred to cardiology. About three days prior to admission, and despite compliance with flagyl, she developed foul-smelling green nonbloody watery stools.  She had a positive C. Diff PCR at her PCP's office.   She became increasingly dehydrated and finally presented to the ER.  She was started on oral  vancomycin and has had some improvement in her diarrhea ,but she is still not eating.  Case has been discussed with ID and they can follow up with her in clinic post discharge.    Assessment/Plan  Recurrent diarrhea despite compliance with flagyl. Could represent C. Diff recurrence with resistance to flagyl or other bacterial, viral, or parasitic diarrhea superimposed on her previous C. Diff.  Case discussed with [name] from [place] who recommended a two week course of vancomycin -  C. Diff PCR negative, but was positive two days prior to admission  - Enteric precautions  - Stool culture pending - O&P pending - Continue oral vancomycin day 3 of 14  Not eating due to mild nausea but mostly because she is afraid of having more diarrhea -  D/c IVF  -  Continue antiemetics  -  Encouraged her to eat and she is willing  ER+, HER2 + Metastatic Breast Cancer complicated by L3 compression and moderate to advanced spinal stenosis and diffuse osseous and liver mets. S/p palliative XRT.  - Continue femara  - Herceptin on hold due to newly discovered heart failuret  - Continue percocet for moderate pain and dilaudid for severe pain  - Continue valium and gabapentin 1211m TID  Newly discovered systolic heart failure with EF 40-45%, asymptomatic  - Consider cardiology consult for symptoms  -  Start very low dose BB  -  If blood pressure remains stable on BB, would add low dose ACEI -  Hold on diuretics for now due to risk of dehydration with ongoing diarrhea -  Daily weights -  Unable to record strict I/o due to incontinence  Severe protein calorie malnutrition  - Regular diet with supplements   RLL segmental PE, stable on RA, continue Lovenox 60 mg q 12 hours.   Hypothyroidism. Could consider hyperthyroidism as cause of diarrhea, however, she does not have tremor or tachycardia and checking TSH during acute illness likely to be inaccurate.  - Continue Levothyroxine daily except Monday and  Thursday.   History of central cord injury. After a horse fall in 1995. Also with L3 compression fracture with chronic Bladder  Bowel incontinence  - WC bound and has caretaker at home   Depression, stable, continue cymbalta 60 mg daily.   Hypokalemia due to diarrhea,  - start daily potassium supplementation - Magnesium: 1.9  - Tele:  NSR, okay to d/c telemetry  [place] metabolic acidosis likely secondary to diarrhea.  Trend.  Leukocytosis, likely due to dehydration -  Trended down with IVF  Anemia of chronic disease, hemoglobin trended down likely secondary to hemodilution  Diet: regular  Access: PIV  IVF: yes  Proph: Therapeutic lovenox   Code Status: Full  Family Communication: patient alone  Disposition Plan:  Home tomorrow if eating okay  Consultants:  None  Procedures:  none  Antibiotics:  Oral vancomycin 9/27 >>   HPI/Subjective:  Had multiple small volume watery BMs yesterday.  Denies abdominal pain.  Persistent nausea.      Objective: Filed Vitals:   11/10/13 0352 11/10/13 1426 11/11/13 0500 11/11/13 0508  BP: 109/62 116/67  114/66  Pulse: 84 98  71  Temp: 98 F (36.7 C) 98 F (36.7 C)  97.2 F (36.2 C)  TempSrc: Oral Oral  Oral  Resp: 18 18  18   Height:      Weight:   55.067 kg (121 lb 6.4 oz)   SpO2: 98% 98%  100%    Intake/Output Summary (Last 24 hours) at 11/11/13 0929 Last data filed at 11/11/13 0700  Gross per 24 hour  Intake 1404.99 ml  Output      1 ml  Net 1403.99 ml   Filed Weights   11/09/13 1653 11/11/13 0500  Weight: 54.2 kg (119 lb 7.8 oz) 55.067 kg (121 lb 6.4 oz)    Exam:   General:  WF, No acute distress  HEENT:  NCAT, MMM  Cardiovascular:  RRR, nl S1, S2 no mrg, 2+ pulses, warm extremities  Respiratory:  CTAB, no increased WOB  Abdomen:   Hyperactive BS, soft, NT/ND  MSK:   Decreased tone and bulk, no LEE  Neuro:  Lower extremity weakness, upper extremities 5/5  Data Reviewed: Basic Metabolic  Panel:  Recent Labs Lab 11/09/13 1433 11/09/13 1515 11/09/13 1547 11/10/13 0749 11/11/13 0350  NA 139 139  --  143 140  K 2.6* 2.3*  --  3.9 3.3*  CL 101 107  --  110 105  CO2 21  --   --  18* 19  GLUCOSE 115* 99  --  67* 61*  BUN 9 7  --  10 6  CREATININE 0.38* 0.40*  --  0.41* 0.42*  CALCIUM 10.2  --   --  10.2 10.2  MG  --   --  1.9  --  1.6   Liver Function Tests:  Recent Labs Lab 11/09/13 1433  AST 30  ALT 13  ALKPHOS 104  BILITOT <0.2*  PROT 7.0  ALBUMIN 2.9*    Recent Labs Lab 11/09/13 1433  LIPASE 5*   No results found for this basename: AMMONIA,  in the last 168 hours  CBC:  Recent Labs Lab 11/09/13 1433 11/09/13 1515 11/10/13 0749 11/11/13 0350  WBC 11.9*  --  6.3 4.1  NEUTROABS 10.2*  --   --   --   HGB 13.3 13.9 11.1* 10.9*  HCT 40.0 41.0 34.5* 33.6*  MCV 98.0  --  100.3* 99.1  PLT 358  --  285 307   Cardiac Enzymes: No results found for this basename: CKTOTAL, CKMB, CKMBINDEX, TROPONINI,  in the last 168 hours BNP (last 3 results) No results found for this basename: PROBNP,  in the last 8760 hours CBG: No results found for this basename: GLUCAP,  in the last 168 hours  Recent Results (from the past 240 hour(s))  CLOSTRIDIUM DIFFICILE BY PCR     Status: None   Collection Time    11/09/13  6:26 PM      Result Value Ref Range Status   C difficile by pcr NEGATIVE  NEGATIVE Final   Comment: Performed at [facility]     Studies: No results found.  Scheduled Meds: . DULoxetine  60 mg Oral Daily  . enoxaparin  60 mg Subcutaneous Q12H  . gabapentin  1,200 mg Oral TID  . lactose free nutrition  237 mL Oral TID WC  . letrozole  2.5 mg Oral Daily  . levothyroxine  75 mcg Oral Once per day on Sun Tue Wed Fri Sat  . multivitamin with minerals  1 tablet Oral Daily  . sodium chloride  3 mL Intravenous Q12H  . sucralfate  1 g Oral TID WC & HS  . tiZANidine  8 mg Oral TID  . topiramate  100 mg Oral QHS  . vancomycin  125 mg Oral 4  times per day   Continuous Infusions: . dextrose 5 % and 0.45 % NaCl with KCl 20 mEq/L      Active Problems:   Hypokalemia   Recurrent Clostridium difficile diarrhea    Time spent: 30 min    Aleiya Rye, [place]  [facility] Pager [phone]. If 7PM-7AM, please contact night-coverage at [email or web address], password [facility] 11/11/2013, 9:29 AM  LOS: 2 days

## 2013-11-12 LAB — BASIC METABOLIC PANEL
Anion gap: 10 (ref 5–15)
BUN: 3 mg/dL — ABNORMAL LOW (ref 6–23)
CO2: 24 mEq/L (ref 19–32)
Calcium: 10.1 mg/dL (ref 8.4–10.5)
Chloride: 107 mEq/L (ref 96–112)
Creatinine, Ser: 0.37 mg/dL — ABNORMAL LOW (ref 0.50–1.10)
Glucose, Bld: 82 mg/dL (ref 70–99)
POTASSIUM: 4 meq/L (ref 3.7–5.3)
SODIUM: 141 meq/L (ref 137–147)

## 2013-11-12 LAB — CBC
HCT: 36.1 % (ref 36.0–46.0)
Hemoglobin: 11.8 g/dL — ABNORMAL LOW (ref 12.0–15.0)
MCH: 32.2 pg (ref 26.0–34.0)
MCHC: 32.7 g/dL (ref 30.0–36.0)
MCV: 98.4 fL (ref 78.0–100.0)
PLATELETS: 332 10*3/uL (ref 150–400)
RBC: 3.67 MIL/uL — AB (ref 3.87–5.11)
RDW: 16.4 % — ABNORMAL HIGH (ref 11.5–15.5)
WBC: 5.2 10*3/uL (ref 4.0–10.5)

## 2013-11-12 LAB — MAGNESIUM: Magnesium: 1.9 mg/dL (ref 1.5–2.5)

## 2013-11-12 MED ORDER — VANCOMYCIN HCL 125 MG PO CAPS: 125.0000 mg | ORAL_CAPSULE | Freq: Four times a day (QID) | ORAL | Status: AC

## 2013-11-12 MED ORDER — GABAPENTIN 400 MG PO CAPS: 1200.0000 mg | ORAL_CAPSULE | Freq: Three times a day (TID) | ORAL | Status: AC

## 2013-11-12 MED ORDER — CARVEDILOL 3.125 MG PO TABS: 3.1250 mg | ORAL_TABLET | Freq: Two times a day (BID) | ORAL | Status: AC

## 2013-11-12 MED ORDER — ONDANSETRON HCL 4 MG PO TABS: 4.0000 mg | ORAL_TABLET | Freq: Three times a day (TID) | ORAL | Status: AC | PRN

## 2013-11-12 NOTE — Progress Notes (Signed)
PT declined chaplain visit. Pt asked chaplain to communicate to nurse that pt is "ready to go," that she has arranged for someone to meet her at home.   Vanetta Mulders 11/12/2013 11:20 AM

## 2013-11-12 NOTE — Discharge Summary (Signed)
Physician Discharge Summary  Kristie Jackson QVZ:[id number] DOB: [birth year] DOA: 11/09/2013  PCP: Kristie Shelling, MD  Admit date: 11/09/2013 Discharge date: 11/12/2013  Recommendations for Outpatient Follow-up:  1. Pt will need to follow up with PCP in 1 week post discharge 2. Please obtain BMP in 1 week   Discharge Diagnoses:  Recurrent diarrhea despite compliance with flagyl. Could represent C. Diff recurrence with resistance to flagyl or other bacterial, viral, or parasitic diarrhea superimposed on her previous C. Diff. Case discussed with [name] from [place] who recommended a two week course of vancomycin  - C. Diff PCR negative, but was positive two days prior to admission  - Enteric precautions  - Stool culture--neg on day of d/c  - O&P--neg on day of d/c - Continue oral vancomycin day 4 of 14  -Rx given for 7 additional days;  Pt stated she had at least 4 more days left at home from prior prescription from [name] Anorexia  -due to mild nausea but mostly because she is afraid of having more diarrhea  - D/c IVF  - Continue antiemetics  - On day of d/c, pt able to eat and tolerate meal without difficulty ER+, HER2 + Metastatic Breast Cancer complicated by L3 compression and moderate to advanced spinal stenosis and diffuse osseous and liver mets. S/p palliative XRT.  - Continue femara  - Herceptin on hold due to newly discovered heart failuret  - Continue percocet for moderate pain and dilaudid for severe pain  - Continue valium and gabapentin 1274m TID  Newly discovered systolic heart failure with EF 40-45%, asymptomatic  - Consider cardiology consult for symptoms  - Start very low dose BB  - If blood pressure remains stable on BB, would add low dose ACEI  - Hold on diuretics for now due to risk of dehydration with ongoing diarrhea  - Daily weights  - Unable to record strict I/o due to incontinence  Severe protein calorie malnutrition  - Regular diet with supplements    RLL segmental PE, stable on RA, continue Lovenox 60 mg q 12 hours.  Hypothyroidism. Could consider hyperthyroidism as cause of diarrhea, however, she does not have tremor or tachycardia and checking TSH during acute illness likely to be inaccurate.  - Continue Levothyroxine daily except Monday and Thursday.  History of central cord injury. After a horse fall in 1995. Also with L3 compression fracture with chronic Bladder  Bowel incontinence  - WC bound and has caretaker at home  Depression, stable, continue cymbalta 60 mg daily.  Hypokalemia due to diarrhea,  - start daily potassium supplementation  - Magnesium: 1.9  - Tele: NSR, okay to d/c telemetry  -K=4.0 on day of d/c [place] metabolic acidosis likely secondary to diarrhea. Trend.  Leukocytosis, likely due to dehydration  - Trended down with IVF  Anemia of chronic disease, hemoglobin trended down likely secondary to hemodilution   Discharge Condition: stable  Disposition:  Follow-up Information   Follow up with GIrven Shelling MD In 1 week.   Specialty:  Internal Medicine   Contact information:   [address] [place] [id number] [phone]    home  Diet:regular Wt Readings from Last 3 Encounters:  11/12/13 55.43 kg (122 lb 3.2 oz)  10/23/13 58.06 kg (128 lb)  10/21/13 57.2 kg (126 lb 1.7 oz)    History of present illness:  The patient is a 58y.o. 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 Diff diarrhea who presented with diarrhea and dehydration. She was diagnosed with ER+, HER2+ breast cancer in 08/2013 and was found to have multiple pathologic compression fractures at T1, T5, and L3 with some spinal cord compression at  L3. She was started on lapatinib and femara by Dr. Juliann Jackson, Oncology, and she completed palliative radiation in 09/2013. She developed diarrhea which was initially attributed to chemo, however, she was hospitalized from 8/28 through 9/2 with diarrhea and was found to have C. Diff diarrhea. She started flagyl, but continued to have diarrhea at home so her PCP, [name], prescribed oral vancomycin. She took about 2 days of oral vanc, but continued to have diarrhea so she was rehospitalized from 9/6 through 9/8. At that time, she was started on oral vanc with IV flagyl, but her persistent diarrhea was attributed to her continued use of PPI and her tykerb (lapatinib). These were stopped and she was discharged on flagyl. Her diarrhea started to resolve. Dr. [name] followed up and prior to starting IV herceptin, he obtained a pre-treatment ECHO which demonstrated newly depressed EF to 40-45%. She has been referred to cardiology. About three days prior to admission, and despite compliance with flagyl, she developed foul-smelling green nonbloody watery stools. She had a positive C. Diff PCR at her PCP's office. She became increasingly dehydrated and finally presented to the ER. She was started on oral vancomycin and has had some improvement in her diarrhea ,but she is still not eating. Case has been discussed with ID and they can follow up with her in clinic post discharge.      Discharge Exam: Filed Vitals:   11/12/13 0756  BP: 107/61  Pulse: 84  Temp:   Resp:    Filed Vitals:   11/11/13 2148 11/12/13 0424 11/12/13 0441 11/12/13 0756  BP: 102/60 103/66  107/61  Pulse: 76 68  84  Temp: 98.1 F (36.7 C) 97.7 F (36.5 C)    TempSrc: Oral Oral    Resp: 18 18    Height:      Weight:   55.43 kg (122 lb 3.2 oz)   SpO2: 98% 100%     General: A&O x 3, NAD, pleasant, cooperative Cardiovascular: RRR, no rub, no gallop, no S3 Respiratory: CTAB, no wheeze, no rhonchi Abdomen:soft, nontender, nondistended,  positive bowel sounds Extremities: 1+LE edema, No lymphangitis, no petechiae  Discharge Instructions      Discharge Instructions   Diet - low sodium heart healthy    Complete by:  As directed      Discharge instructions    Complete by:  As directed   Take vancomycin 153m, one by mouth 4 times daily for 10 more days     Increase activity slowly    Complete by:  As directed             Medication List    STOP taking these medications       metroNIDAZOLE 500 MG tablet  Commonly known as:  FLAGYL      TAKE these medications       aspirin-acetaminophen-caffeine 250-250-65 MG per tablet  Commonly known as:  EXCEDRIN MIGRAINE  Take 1 tablet by mouth every 6 (six) hours as needed for headache.     carvedilol 3.125 MG tablet  Commonly known as:  COREG  Take 1 tablet (  3.125 mg total) by mouth 2 (two) times daily with a meal.     diazepam 10 MG tablet  Commonly known as:  VALIUM  Take 10 mg by mouth every 6 (six) hours as needed. For muscle spasms     DULoxetine 60 MG capsule  Commonly known as:  CYMBALTA  Take 60 mg by mouth daily.     enoxaparin 60 MG/0.6ML injection  Commonly known as:  LOVENOX  Inject 0.6 mLs (60 mg total) into the skin every 12 (twelve) hours.     gabapentin 400 MG capsule  Commonly known as:  NEURONTIN  Take 3 capsules (1,200 mg total) by mouth 3 (three) times daily.     HYDROmorphone 2 MG tablet  Commonly known as:  DILAUDID  Take 1 tablet (2 mg total) by mouth every 4 (four) hours as needed for severe pain.     lactose free nutrition Liqd  Take 237 mLs by mouth 3 (three) times daily with meals.     letrozole 2.5 MG tablet  Commonly known as:  FEMARA  Take 1 tablet (2.5 mg total) by mouth daily.     levothyroxine 75 MCG tablet  Commonly known as:  SYNTHROID, LEVOTHROID  Take 75 mcg by mouth See admin instructions. Takes daily except Monday and Thursday     multivitamin with minerals Tabs tablet  Take 1 tablet by mouth daily.      OMEGA 3 PO  Take 1 capsule by mouth daily.     ondansetron 4 MG tablet  Commonly known as:  ZOFRAN  Take 1 tablet (4 mg total) by mouth every 8 (eight) hours as needed for nausea or vomiting.     oxyCODONE-acetaminophen 5-325 MG per tablet  Commonly known as:  PERCOCET/ROXICET  Take 1-2 tablets by mouth every 4 (four) hours as needed for moderate pain or severe pain.     PROBIOTIC DAILY PO  Take 1 capsule by mouth daily.     rizatriptan 10 MG tablet  Commonly known as:  MAXALT  Take 10 mg by mouth as needed for migraine. May repeat in 2 hours if needed     sucralfate 1 G tablet  Commonly known as:  CARAFATE  Take 1 g by mouth 4 (four) times daily -  with meals and at bedtime.     tiZANidine 4 MG tablet  Commonly known as:  ZANAFLEX  Take 8 mg by mouth 3 (three) times daily.     topiramate 100 MG tablet  Commonly known as:  TOPAMAX  Take 100 mg by mouth at bedtime.     triamcinolone 0.1 % cream : eucerin Crea  Apply 1 application topically 2 (two) times daily.     vancomycin 125 MG capsule  Commonly known as:  VANCOCIN  Take 1 capsule (125 mg total) by mouth 4 (four) times daily.         The results of significant diagnostics from this hospitalization (including imaging, microbiology, ancillary and laboratory) are listed below for reference.    Significant Diagnostic Studies: No results found.   Microbiology: Recent Results (from the past 240 hour(s))  CLOSTRIDIUM DIFFICILE BY PCR     Status: None   Collection Time    11/09/13  6:26 PM      Result Value Ref Range Status   C difficile by pcr NEGATIVE  NEGATIVE Final   Comment: Performed at [place]     Status: None   Collection Time  11/10/13 11:46 AM      Result Value Ref Range Status   Specimen Description STOOL   Final   Special Requests Normal   Final   Culture     Final   Value: Culture reincubated for better growth     Performed at [facility]   Report Status  PENDING   Incomplete  OVA AND PARASITE EXAMINATION     Status: None   Collection Time    11/10/13 11:46 AM      Result Value Ref Range Status   Specimen Description STOOL   Final   Special Requests Normal   Final   Ova and parasites     Final   Value: NO OVA OR PARASITES SEEN ABUNDANT WBC     Performed at [facility]   Report Status 11/11/2013 FINAL   Final     Labs: Basic Metabolic Panel:  Recent Labs Lab 11/09/13 1433 11/09/13 1515 11/09/13 1547 11/10/13 0749 11/11/13 0350 11/12/13 0502  NA 139 139  --  143 140 141  K 2.6* 2.3*  --  3.9 3.3* 4.0  CL 101 107  --  110 105 107  CO2 21  --   --  18* 19 24  GLUCOSE 115* 99  --  67* 61* 82  BUN 9 7  --  10 6 3*  CREATININE 0.38* 0.40*  --  0.41* 0.42* 0.37*  CALCIUM 10.2  --   --  10.2 10.2 10.1  MG  --   --  1.9  --  1.6 1.9   Liver Function Tests:  Recent Labs Lab 11/09/13 1433  AST 30  ALT 13  ALKPHOS 104  BILITOT <0.2*  PROT 7.0  ALBUMIN 2.9*    Recent Labs Lab 11/09/13 1433  LIPASE 5*   No results found for this basename: AMMONIA,  in the last 168 hours CBC:  Recent Labs Lab 11/09/13 1433 11/09/13 1515 11/10/13 0749 11/11/13 0350 11/12/13 0502  WBC 11.9*  --  6.3 4.1 5.2  NEUTROABS 10.2*  --   --   --   --   HGB 13.3 13.9 11.1* 10.9* 11.8*  HCT 40.0 41.0 34.5* 33.6* 36.1  MCV 98.0  --  100.3* 99.1 98.4  PLT 358  --  285 307 332   Cardiac Enzymes: No results found for this basename: CKTOTAL, CKMB, CKMBINDEX, TROPONINI,  in the last 168 hours BNP: No components found with this basename: POCBNP,  CBG: No results found for this basename: GLUCAP,  in the last 168 hours  Time coordinating discharge:  Greater than 30 minutes  Signed:  Lissy Deuser, DO [facility] Pager: [phone] 11/12/2013, 10:45 AM

## 2013-11-12 NOTE — Progress Notes (Signed)
CSW consulted for transportation needs. Patient will need non-emergency ambulance transport home. CSW confirmed home address with patient.   [facility] called for transport.   No other CSW needs identified - CSW signing off.   Menna Abeln, LCSW [facility] Clinical Social Worker cell #: [phone]     

## 2013-11-13 ENCOUNTER — Ambulatory Visit: Payer: Medicare Other

## 2013-11-13 ENCOUNTER — Other Ambulatory Visit: Payer: Medicare Other

## 2013-11-14 LAB — STOOL CULTURE: SPECIAL REQUESTS: NORMAL

## 2013-11-19 ENCOUNTER — Other Ambulatory Visit: Payer: Self-pay | Admitting: Internal Medicine

## 2013-11-19 ENCOUNTER — Ambulatory Visit (HOSPITAL_COMMUNITY)
Admission: RE | Admit: 2013-11-19 | Discharge: 2013-11-19 | Disposition: A | Discharge status: Home / Self Care | Payer: Medicare Other | Source: Ambulatory Visit | Attending: Internal Medicine | Admitting: Internal Medicine

## 2013-11-19 ENCOUNTER — Encounter (HOSPITAL_COMMUNITY): Payer: Self-pay

## 2013-11-19 VITALS — BP 92/76 | HR 111 | Wt 122.0 lb

## 2013-11-19 DIAGNOSIS — C799 Secondary malignant neoplasm of unspecified site: Secondary | ICD-10-CM

## 2013-11-19 DIAGNOSIS — Z170 Estrogen receptor positive status [ER+]: Secondary | ICD-10-CM | POA: Diagnosis not present

## 2013-11-19 DIAGNOSIS — R000 Tachycardia, unspecified: Secondary | ICD-10-CM

## 2013-11-19 DIAGNOSIS — M4806 Spinal stenosis, lumbar region: Secondary | ICD-10-CM | POA: Insufficient documentation

## 2013-11-19 DIAGNOSIS — I429 Cardiomyopathy, unspecified: Secondary | ICD-10-CM | POA: Diagnosis not present

## 2013-11-19 DIAGNOSIS — C50919 Malignant neoplasm of unspecified site of unspecified female breast: Secondary | ICD-10-CM | POA: Insufficient documentation

## 2013-11-19 DIAGNOSIS — F17210 Nicotine dependence, cigarettes, uncomplicated: Secondary | ICD-10-CM | POA: Diagnosis not present

## 2013-11-19 DIAGNOSIS — C787 Secondary malignant neoplasm of liver and intrahepatic bile duct: Secondary | ICD-10-CM | POA: Diagnosis not present

## 2013-11-19 DIAGNOSIS — I509 Heart failure, unspecified: Secondary | ICD-10-CM | POA: Diagnosis not present

## 2013-11-19 NOTE — Progress Notes (Signed)
Patient ID: Kristie Jackson, female   DOB: 12-18-1955, 59 y.o.   MRN: [id number] Referring Physician: Primary Care: Primary Cardiologist: None  Oncologist: Dr Lindi Adie   HPI: Kristie Jackson is 58 year old with a history  of stage IV metastatic breast cancer diagnosed in July 2015, mets R hip and vertebrae, + C diff, hypothyroidism, and spinal cord injury 1995. 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    She started femara + lapatinib in July 2015. Lapatinib was stopped in August due to diarrhea.  She has a history of spinal cord injury dating back to 1995 after a horse fall and recently she had a second complication in her spinal cord when she had spinal cord compression from her vertebral mets and required radiation to her T4-6 spine, T12 and right hip. She got 30 Gy with Dr Tammi Klippel which she completed on 09/19/13. Patient also have history of pulmonary embolism in 7/15 on Lovenox injections 60 mg sq bid.  She was hospitalized in 9/15 with C difficile diarrhea.  She was planned to start IV Herceptin but had echo with depressed EF so this is on hold.    Has a care giver 24 hours. Requires assistance with all ADLs. Requires medical transportation to appointments.   She has no known cardiac disease (prior to 9/15 echo).  No chest pain at all.  She is not very mobile but denies dyspnea.   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, lives alone , smokes 2 PPD  Review of Systems: [y] = yes, [ ]  = no   General: Weight gain [ ] ; Weight loss [ Y]; Anorexia [ ] ; Fatigue [Y ]; Fever [ ] ; Chills [ ] ; Weakness [Y ]  Cardiac: Chest pain/pressure [ ] ; Resting SOB [ ] ; Exertional SOB [ ] ; Orthopnea [ ] ; Pedal Edema [ ] ; Palpitations [ ] ; Syncope [ ] ; Presyncope [ ] ; Paroxysmal nocturnal dyspnea[ ]   Pulmonary: Cough [ ] ; Wheezing[ ] ; Hemoptysis[ ] ; Sputum [ ] ; Snoring [ ]   GI: Vomiting[ ] ; Dysphagia[ ] ; Melena[ ] ;  Hematochezia [ ] ; Heartburn[ ] ; Abdominal pain [ ] ; Constipation [ ] ; Diarrhea [ ] ; BRBPR [ ]   GU: Hematuria[ ] ; Dysuria [ ] ; Nocturia[ ]   Vascular: Pain in legs with walking [ ] ; Pain in feet with lying flat [ ] ; Non-healing sores [ ] ; Stroke [ ] ; TIA [ ] ; Slurred speech [ ] ;  Neuro: Headaches[ ] ; Vertigo[ ] ; Seizures[ ] ; Paresthesias[ ] ;Blurred vision [ ] ; Diplopia [ ] ; Vision changes [ ]   Ortho/Skin: Arthritis [ ] ; Joint pain [Y ]; Muscle pain [Y ]; Joint swelling [ ] ; Back Pain [Y ]; Rash [ ]   Psych: Depression[Y ]; Anxiety[ ]   Heme: Bleeding problems [ ] ; Clotting disorders [ ] ; Anemia [ ]   Endocrine: Diabetes [ ] ; Thyroid dysfunction[ ]    Past Medical History  Diagnosis Date  . Central hypothyroidism   . Hyperlipidemia   . Spinal cord injury   . Depression   . Cancer     spinal metastasis  . PONV (postoperative nausea and vomiting)   . Pulmonary embolism 08/2013  . C. difficile diarrhea     Current Outpatient Prescriptions  Medication Sig Dispense Refill  . aspirin-acetaminophen-caffeine (EXCEDRIN MIGRAINE) 250-250-65 MG per tablet Take 1 tablet by mouth every 6 (six) hours as needed for headache.      . carvedilol (COREG) 3.125 MG tablet Take  1 tablet (3.125 mg total) by mouth 2 (two) times daily with a meal.  60 tablet  0  . diazepam (VALIUM) 10 MG tablet Take 10 mg by mouth every 6 (six) hours as needed. For muscle spasms      . DULoxetine (CYMBALTA) 60 MG capsule Take 60 mg by mouth daily.      Marland Kitchen enoxaparin (LOVENOX) 60 MG/0.6ML injection Inject 0.6 mLs (60 mg total) into the skin every 12 (twelve) hours.  60 Syringe  5  . gabapentin (NEURONTIN) 400 MG capsule Take 3 capsules (1,200 mg total) by mouth 3 (three) times daily.  90 capsule  0  . lactose free nutrition (BOOST PLUS) LIQD Take 237 mLs by mouth 3 (three) times daily with meals.  90 Can  1  . letrozole (FEMARA) 2.5 MG tablet Take 1 tablet (2.5 mg total) by mouth daily.  30 tablet  2  . levothyroxine (SYNTHROID,  LEVOTHROID) 75 MCG tablet Take 75 mcg by mouth See admin instructions. Takes daily except Monday and Thursday      . Multiple Vitamin (MULTIVITAMIN WITH MINERALS) TABS tablet Take 1 tablet by mouth daily.      . Omega-3 Fatty Acids (OMEGA 3 PO) Take 1 capsule by mouth daily.      . ondansetron (ZOFRAN) 4 MG tablet Take 1 tablet (4 mg total) by mouth every 8 (eight) hours as needed for nausea or vomiting.  20 tablet  0  . oxyCODONE-acetaminophen (PERCOCET/ROXICET) 5-325 MG per tablet Take 1-2 tablets by mouth every 4 (four) hours as needed for moderate pain or severe pain.  30 tablet  0  . Probiotic Product (PROBIOTIC DAILY PO) Take 1 capsule by mouth daily.      . rizatriptan (MAXALT) 10 MG tablet Take 10 mg by mouth as needed for migraine. May repeat in 2 hours if needed      . sucralfate (CARAFATE) 1 G tablet Take 1 g by mouth 4 (four) times daily -  with meals and at bedtime.      Marland Kitchen tiZANidine (ZANAFLEX) 4 MG tablet Take 8 mg by mouth 3 (three) times daily.      Marland Kitchen topiramate (TOPAMAX) 100 MG tablet Take 100 mg by mouth at bedtime.      . Triamcinolone Acetonide (TRIAMCINOLONE 0.1 % CREAM : EUCERIN) CREA Apply 1 application topically 2 (two) times daily.      . vancomycin (VANCOCIN) 125 MG capsule Take 1 capsule (125 mg total) by mouth 4 (four) times daily.  28 capsule  0   No current facility-administered medications for this encounter.    Allergies  Allergen Reactions  . Morphine And Related Nausea And Vomiting      History   Social History  . Marital Status: Divorced    Spouse Name: N/A    Number of Children: N/A  . Years of Education: N/A   Occupational History  . Not on file.   Social History Main Topics  . Smoking status: Current Every Day Smoker -- 1.00 packs/day for 40 years    Types: Cigarettes  . Smokeless tobacco: Never Used  . Alcohol Use: No  . Drug Use: No  . Sexual Activity: Not on file   Other Topics Concern  . Not on file   Social History Narrative    Wheelchair bound.  Incontinent of urine.  PT/OT.  HH aid.        Family History  Problem Relation Age of Onset  . Breast cancer Neg Hx   .  Uterine cancer Mother     consider strain of ovarian ca    Filed Vitals:   11/19/13 1427  BP: 92/76  Pulse: 111  Weight: 122 lb (55.339 kg)  SpO2: 99%    PHYSICAL EXAM: General:  Well appearing. No respiratory difficulty. Sitting in wheelchair.  HEENT: normal Neck: supple. no JVD. Carotids 2+ bilat; no bruits. No lymphadenopathy or thryomegaly appreciated. Cor: PMI nondisplaced. Regular rate & rhythm. No rubs, gallops or murmurs. Lungs: clear Abdomen: soft, nontender, nondistended. No hepatosplenomegaly. No bruits or masses. Good bowel sounds. Extremities: no cyanosis, clubbing, rash, edema Neuro: alert & oriented x 3, cranial nerves grossly intact. moves all 4 extremities w/o difficulty. Affect pleasant.     ASSESSMENT & PLAN:  1. 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  ECHO reviewed and discussed. EF 40-45% but poor windows. This was done after the patient had received lapatinib, no echo done prior to lapatinib.  Use of lapatinib does carry risk of cardiomyopathy.  BP soft. Continue carvedilol 3.125 mg twice a day. Unable to add Ace or up titrate meds.  - Would do Cardiac MRI 1 month after echo to assess EF.  If improved, Herceptin use (with careful monitoring) would be reasonable.  If no change or worse, will need to discuss with oncology.   2. C diff: Recent hospitalization, diarrhea has resolved.   3. Spinal compression with impaired mobility.   4. PE: 7/15, on Lovenox.   Darrick Grinder, NP-C 11/19/2013  Patient seen with NP, agree with the above note.  Patient was noted to have EF 40-45% on echo in 9/15.  The echo was difficult with poor windows.  This is after she had received lapatinib.  She has no  history of cardiac disease.  She has had no chest pain or dyspnea.  She is a smoker.  It is possible that the cardiomyopathy is from lapatinib.  This will create problems given plan to treat with Herceptin now, given the risk of worsening cardiomyopathy.  It is also possible that she has a cardiomyopathy from some other cause (CAD, etc) though as above, no symptoms suggestive of CAD.  - Continue Coreg.  Unable to titrate up today or add ACEI given soft blood pressure.  - I would like to get a cardiac MRI 1 month after the echo to reassess EF.  If improved, would be reasonable to treat with Herceptin but with very close monitoring of EF.  If not improved or worsened, will need to discuss with oncology.  - I think that an ischemic workup would be reasonable given low EF of uncertain etiology.  She is a smoker.  I will arrange for Lexiscan Cardiolite.  She had an ECG today with mild diffuse ST elevation, uncertain significance given lack of any cardiac symptoms currently.   Loralie Champagne 11/19/2013

## 2013-11-19 NOTE — Patient Instructions (Signed)
Your physician has requested that you have a cardiac MRI. Cardiac MRI uses a computer to create images of your heart as its beating, producing both still and moving pictures of your heart and major blood vessels. For further information please visit [email or web address] Please follow the instruction sheet given to you today for more information.  Your physician recommends that you schedule a follow-up appointment in: 3 weeks  If your Systolic Blood Pressure is BELOW 85, DO NOT take your carvedilol

## 2013-11-20 ENCOUNTER — Ambulatory Visit (HOSPITAL_BASED_OUTPATIENT_CLINIC_OR_DEPARTMENT_OTHER): Payer: Medicare Other | Admitting: Hematology

## 2013-11-20 ENCOUNTER — Other Ambulatory Visit (HOSPITAL_BASED_OUTPATIENT_CLINIC_OR_DEPARTMENT_OTHER): Payer: Medicare Other

## 2013-11-20 ENCOUNTER — Telehealth: Payer: Self-pay | Admitting: Hematology

## 2013-11-20 ENCOUNTER — Ambulatory Visit: Payer: Medicare Other

## 2013-11-20 VITALS — BP 97/66 | HR 100 | Temp 98.4°F | Resp 17 | Ht 68.0 in

## 2013-11-20 DIAGNOSIS — S32039A Unspecified fracture of third lumbar vertebra, initial encounter for closed fracture: Secondary | ICD-10-CM

## 2013-11-20 DIAGNOSIS — C50919 Malignant neoplasm of unspecified site of unspecified female breast: Secondary | ICD-10-CM

## 2013-11-20 DIAGNOSIS — C787 Secondary malignant neoplasm of liver and intrahepatic bile duct: Secondary | ICD-10-CM

## 2013-11-20 DIAGNOSIS — C778 Secondary and unspecified malignant neoplasm of lymph nodes of multiple regions: Secondary | ICD-10-CM

## 2013-11-20 DIAGNOSIS — C7951 Secondary malignant neoplasm of bone: Secondary | ICD-10-CM

## 2013-11-20 DIAGNOSIS — I2782 Chronic pulmonary embolism: Secondary | ICD-10-CM

## 2013-11-20 LAB — CBC WITH DIFFERENTIAL/PLATELET
BASO%: 0.7 % (ref 0.0–2.0)
BASOS ABS: 0.1 10*3/uL (ref 0.0–0.1)
EOS%: 1 % (ref 0.0–7.0)
Eosinophils Absolute: 0.1 10*3/uL (ref 0.0–0.5)
HCT: 40.5 % (ref 34.8–46.6)
HGB: 13.1 g/dL (ref 11.6–15.9)
LYMPH%: 8.5 % — AB (ref 14.0–49.7)
MCH: 32 pg (ref 25.1–34.0)
MCHC: 32.5 g/dL (ref 31.5–36.0)
MCV: 98.6 fL (ref 79.5–101.0)
MONO#: 0.9 10*3/uL (ref 0.1–0.9)
MONO%: 9.1 % (ref 0.0–14.0)
NEUT#: 8.3 10*3/uL — ABNORMAL HIGH (ref 1.5–6.5)
NEUT%: 80.7 % — ABNORMAL HIGH (ref 38.4–76.8)
Platelets: 356 10*3/uL (ref 145–400)
RBC: 4.11 10*6/uL (ref 3.70–5.45)
RDW: 17 % — AB (ref 11.2–14.5)
WBC: 10.3 10*3/uL (ref 3.9–10.3)
lymph#: 0.9 10*3/uL (ref 0.9–3.3)

## 2013-11-20 LAB — COMPREHENSIVE METABOLIC PANEL (CC13)
ALBUMIN: 3 g/dL — AB (ref 3.5–5.0)
ALT: 23 U/L (ref 0–55)
AST: 24 U/L (ref 5–34)
Alkaline Phosphatase: 116 U/L (ref 40–150)
Anion Gap: 7 mEq/L (ref 3–11)
BILIRUBIN TOTAL: 0.22 mg/dL (ref 0.20–1.20)
BUN: 12 mg/dL (ref 7.0–26.0)
CALCIUM: 11.7 mg/dL — AB (ref 8.4–10.4)
CO2: 29 mEq/L (ref 22–29)
Chloride: 103 mEq/L (ref 98–109)
Creatinine: 0.6 mg/dL (ref 0.6–1.1)
GLUCOSE: 97 mg/dL (ref 70–140)
Potassium: 4.1 mEq/L (ref 3.5–5.1)
Sodium: 139 mEq/L (ref 136–145)
Total Protein: 6.8 g/dL (ref 6.4–8.3)

## 2013-11-20 MED ORDER — SODIUM CHLORIDE 0.9 % IJ SOLN
10.0000 mL | INTRAMUSCULAR | Status: DC | PRN
  Filled 2013-11-20: qty 10

## 2013-11-20 MED ORDER — DENOSUMAB 120 MG/1.7ML ~~LOC~~ SOLN
120.0000 mg | Freq: Once | SUBCUTANEOUS | Status: AC
  Administered 2013-11-20: 120 mg via SUBCUTANEOUS
  Filled 2013-11-20: qty 1.7

## 2013-11-20 NOTE — Telephone Encounter (Signed)
gv pt appt schedule for nov/dec. central will call re pet scan appt - pt aware.

## 2013-11-20 NOTE — Addendum Note (Signed)
Encounter addended by: Asencion Gowda, CCT on: 11/20/2013 11:51 AM<BR>     Documentation filed: Charges VN

## 2013-11-20 NOTE — Progress Notes (Signed)
xgeva consent obtained today. Send to med rec'ds for scanning.

## 2013-11-23 ENCOUNTER — Encounter: Payer: Self-pay | Admitting: Hematology

## 2013-11-23 NOTE — Progress Notes (Signed)
[place] ONCOLOGY OFFICE PROGRESS NOTE Date of Visit: 11/20/2013  Kristie Shelling, MD [address], [address] [place] [address]  DIAGNOSIS:  1. Metastatic Breast cancer (MBC) Her-2 positive disease. 2. Cardiomyopathy (unexplained etiology perhaps related to Lapatinib) 3. Recent hospitalization for C Diff colitis from 10/19/13 through 10/21/13. 4. To discuss further management for her [place].  CURRENT TREATMENT:   1. Femara 2.5 mg daily started on 09/05/2013. (I will continue that) 2. Tykerb 1250 mg by mouth daily beginning July 2015 (I discontinued that on 10/23/13 because of ongoing diarrhea and dehydration and 2 hospitalizations and low EF) 3. Herceptin not started yet until improvement seen in EF. 4. Delton See started today due to skeletal metastasis and Hypercalcemia.  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 Her pathology is 100% ER positive she is also HER-2 positive thus her combination therapy with Femara and Tykerb was initiated but now with this diarrhrea caused by c diff, it would be a good time to change the tykerb to herceptin. She is accompanied today by her caregiver.   She had two hospitalizations first from 09/21/13 through 10/15/13 with diarrhea and dehydration and then second one was from 10/19/13 through 10/21/13. I reviewed the hospital course for both and they are in [facility] EMR. When discharged she was send home on Ciprofloxacin x 2 days which she has completed and Flagyl x 11 days which she is still taking.   She has a history of spinal cord injury dating back to 1995 after a horse fall and recently she had a second complication in her spinal cord when she had spinal cord compression from her vertebral mets and required radiation to her T4-6 spine, T12 and right hip. She got 30 Gy with Dr  Tammi Klippel which she completed on 09/19/13. Patient also have history of pulmonary embolism on Lovenox injections 60 mg sq bid. She also have history of migraine headaches. MRI brain did not show braain mets but + skull mets on 08/29/13. The MRI of spine showed osseous mets in C spine, pathologic fracture T1 but no epidural tumor. Pathologic fracture L1. Pathologic fracture of T5. There was extensive disease noted at all levels. She got radiation to the areas which were painful or risk for cord compression.  Last restaging CT Chest 08/29/13 showed: IMPRESSION:  1. Segmental pulmonary embolism to the right lower lobe. 2. Findings consistent with right breast carcinoma with axillary/pectoral nodal spread and diffuse osseous metastatic disease. 3. L1 and L3 pathologic compression fractures. Retropulsion and extraosseous tumor at L3 causes advanced spinal canal and left foraminal stenosis. 4. Extensive demineralization of the right acetabulum, at significant risk for pathologic fracture. 5. 5 cm hemangioma in the right liver. Two tiny low densities in the  liver which can be followed by imaging. 6. Focal urothelial thickening in the right bladder, a possible second malignancy. 7. Borderline enlarged right supraclavicular lymph node. This was my first encounter with the patient and I reviewed all her records from clinic and the hospital.  After her last visit, when I reviewed Echo, the EF was reported at 40-45% on 10/28/13. I did not start Herceptin. I sought opinion from different experts and the consensus was to hold herceptin and do further workup for cardiomyopathy.Patient referred to a cardiologist Dr Loralie Champagne and seen on 11/19/13.   ECHO reviewed and discussed. EF  40-45% but poor windows. This was done after the patient had received lapatinib, no echo done prior to lapatinib. Use of lapatinib does carry risk of cardiomyopathy. BP soft. Continue carvedilol 3.125 mg twice a day. Unable to add Ace or up titrate  meds. Recommended a Cardiac MRI 1 month after echo to assess EF. If improved, Herceptin use (with careful monitoring) would be reasonable. If no change or worse, will need to discuss with oncology.   Patient states that her left leg also has more weakness and physical therapy not keen with working with her. I will set up a PET scan for restaging in few weeks to re-assess the burden of metastatic diseases in her skeletal system. Her diarrhea did get better with oral vancomycin and she is finishing that on this coming weekend.   She was examined today and her labs today showed normal CMET exceopt her Calcium was 11.7 up from 10.1 and I did order an [place] shot for it to start today. CBC was normal.   MEDICAL HISTORY: Past Medical History  Diagnosis Date  . Central hypothyroidism   . Hyperlipidemia   . Spinal cord injury   . Depression   . Cancer     spinal metastasis  . PONV (postoperative nausea and vomiting)   . Pulmonary embolism 08/2013  . C. difficile diarrhea     INTERIM HISTORY: has Fracture of L3 vertebra; Fracture of L1 vertebra; Substance abuse; Protein-calorie malnutrition, severe; Breast cancer metastasized to multiple sites; Weight loss; Hypothyroidism (acquired); Underweight; C. difficile diarrhea; Hypotension; Hypokalemia; Recurrent Clostridium difficile diarrhea; and Cardiomyopathy on her problem list.    Patient had EKG done but I do not see any baseline Echo done on her which should be important to get prior to Tykerb or Herceptin because of the possibility of a reversible decline in the ejection fraction with their usage.  She was hypotensive in the clinic last visit. She is not able to walk or ambulate and wheel chair bound now. We did check her labs..  ALLERGIES:  is allergic to morphine and related.  MEDICATIONS: has a current medication list which includes the following prescription(s): aspirin-acetaminophen-caffeine, carvedilol, diazepam, duloxetine, enoxaparin,  gabapentin, lactose free nutrition, letrozole, levothyroxine, multivitamin with minerals, omega-3 fatty acids, ondansetron, oxycodone-acetaminophen, probiotic product, rizatriptan, sucralfate, tizanidine, topiramate, triamcinolone 0.1 % cream : eucerin, and vancomycin, and the following Facility-Administered Medications: sodium chloride.  SURGICAL HISTORY:  Past Surgical History  Procedure Laterality Date  . Laminectomy    . Abdominal hysterectomy      REVIEW OF SYSTEMS:   Constitutional: Denies fevers, chills or abnormal weight loss, still c/o pain Eyes: Denies blurriness of vision Ears, nose, mouth, throat, and face: Denies mucositis or sore throat Respiratory: Denies cough, dyspnea or wheezes Cardiovascular: Denies palpitation, chest discomfort or lower extremity swelling Gastrointestinal:  Denies nausea, heartburn or change in bowel habits. Diarrhea improved. Skin: Denies abnormal skin rashes Lymphatics: Denies new lymphadenopathy or easy bruising Neurological:Denies numbness, tingling or new weaknesses Behavioral/Psych: Mood is stable, no new changes  All other systems were reviewed with the patient and are negative.  PHYSICAL EXAMINATION: ECOG PERFORMANCE STATUS: 2-3  Blood pressure 97/66, pulse 100, temperature 98.4 F (36.9 C), temperature source Oral, resp. rate 17, height 5' 8"  (1.727 m), weight 0 lb (0 kg), SpO2 97.00%. Repeat BP post hydration was 100/65.  GENERAL:alert, no distress and comfortable; sitting in wheelchair comfortable.  SKIN: skin color, texture, turgor are normal, no rashes or significant lesions EYES: normal, Conjunctiva are pink  and non-injected, sclera clear OROPHARYNX:no exudate, no erythema and lips, buccal mucosa, and tongue normal NECK: supple, thyroid normal size, non-tender, without nodularityLYMPH:  no palpable lymphadenopathy in the cervical,  Supraclavicular BREASTS: exam deferred today. It was 8x8 cm on 09/10/13 exam.LUNGS: clear to auscultation  with normal breathing effort, no wheezes or rhonchi HEART: regular rate & rhythm and no murmurs and no lower extremity edema ABDOMEN:abdomen soft, non-tender and normal bowel sounds Musculoskeletal:no cyanosis of digits and no clubbing NEURO: alert & oriented x 3 with fluent speech, lower extremity decreased movement (R greater than L) to the hips.   Labs:  Lab Results  Component Value Date   WBC 10.3 11/20/2013   HGB 13.1 11/20/2013   HCT 40.5 11/20/2013   MCV 98.6 11/20/2013   PLT 356 11/20/2013   NEUTROABS 8.3* 11/20/2013      Chemistry      Component Value Date/Time   NA 139 11/20/2013 1236   NA 141 11/12/2013 0502   K 4.1 11/20/2013 1236   K 4.0 11/12/2013 0502   CL 107 11/12/2013 0502   CO2 29 11/20/2013 1236   CO2 24 11/12/2013 0502   BUN 12.0 11/20/2013 1236   BUN 3* 11/12/2013 0502   CREATININE 0.6 11/20/2013 1236   CREATININE 0.37* 11/12/2013 0502      Component Value Date/Time   CALCIUM 11.7* 11/20/2013 1236   CALCIUM 10.1 11/12/2013 0502   CALCIUM 10.0 08/28/2013 1000   ALKPHOS 116 11/20/2013 1236   ALKPHOS 104 11/09/2013 1433   AST 24 11/20/2013 1236   AST 30 11/09/2013 1433   ALT 23 11/20/2013 1236   ALT 13 11/09/2013 1433   BILITOT 0.22 11/20/2013 1236   BILITOT <0.2* 11/09/2013 1433       Basic Metabolic Panel:  Recent Labs Lab 11/20/13 1236  NA 139  K 4.1  CO2 29  GLUCOSE 97  BUN 12.0  CREATININE 0.6  CALCIUM 11.7*   Liver Function Tests:  Recent Labs Lab 11/20/13 1236  AST 24  ALT 23  ALKPHOS 116  BILITOT 0.22  PROT 6.8  ALBUMIN 3.0*   CBC:  Recent Labs Lab 11/20/13 1236  WBC 10.3  NEUTROABS 8.3*  HGB 13.1  HCT 40.5  MCV 98.6  PLT 356    RADIOGRAPHIC STUDIES: I reviewed them all today. Ct Head Wo Contrast  08/28/2013   CLINICAL DATA:  The encephalopathy.  EXAM: CT HEAD WITHOUT CONTRAST  TECHNIQUE: Contiguous axial images were obtained from the base of the skull through the vertex without intravenous contrast.  COMPARISON:  None.  FINDINGS:  Skull and Sinuses:There are ill-defined lytic lesions throughout the calvarium consistent with metastatic disease. The most notable lesions are in the right parietal and central occipital-parietal region measuring up to 3.5 cm in diameter. These lesions errode through the inner table, including at the level of the superior sagittal sinus.  Orbits: No acute abnormality.  Brain: No evidence of acute abnormality, such as acute infarction, hemorrhage, hydrocephalus, or mass lesion/mass effect. Apparent low-density in the medial right temporal lobe was evaluated on coronal reformats. This is in a region of streak artifact, the most likely cause. There is no mass effect in this region. No evidence of low-density along the cingulate gyrus or insula.  IMPRESSION: 1. No acute intracranial findings. 2. Multi focal calvarial metastatic disease. There is multi focal erosion through the inner table, including over the superior sagittal sinus. Suggest enhanced MRI followup.   Electronically Signed   By: Jorje Guild  M.D.   On: 08/28/2013 02:21   Ct Chest W Contrast   08/29/2013   CLINICAL DATA:  Evaluate for source of malignancy.  EXAM: CT CHEST, ABDOMEN, AND PELVIS WITH CONTRAST  TECHNIQUE: Multidetector CT imaging of the chest, abdomen and pelvis was performed following the standard protocol during bolus administration of intravenous contrast.  CONTRAST:  23m OMNIPAQUE IOHEXOL 300 MG/ML  SOLN  COMPARISON:  None.  FINDINGS: CT CHEST FINDINGS  THORACIC INLET/BODY WALL:  There is asymmetric, spiculated sub areolar tissue on the right. There is right axillary and deep pectoral lymphadenopathy with cortical thickening up to 1 cm. There is a 7 mm right supraclavicular lymph node which is moderately suspicious.  Bilateral thyroid nodules with coarse calcification, measuring up to 2 cm on the right. This is likely incidental given the patient's overall clinical status.  MEDIASTINUM:  Normal heart size. No pericardial effusion.  There is residual thymus noted. Filling present within segmental arteries of the right lower lobe. No lymphadenopathy.  LUNG WINDOWS:  No definitive metastasis. Small bilateral pleural effusions with dependent atelectasis.  OSSEOUS:  See below  CT ABDOMEN AND PELVIS FINDINGS  BODY WALL: Unremarkable.  Liver: There is a 5 cm mass in segment 6 which has peripheral nodular enhancement and centripetal fill-in. There is a 5 mm and 7 mm low-density nodules within the right hepatic lobe which may enhance on delayed imaging.  Biliary: Prominent intrahepatic biliary tree without visible cause.  Pancreas: Prominent diameter of the main pancreatic duct, measuring 4 mm. There is no visible mass to cause obstruction.  Spleen: Unremarkable.  Adrenals: Unremarkable.  Kidneys and ureters: 4 mm nonobstructive stone in the interpolar left kidney.  Bladder: Focal thickening and enhancement along the right bladder base, measuring up to 7 mm in thickness. There is no excretory contrast to suggest layering hyperdense urine.  Reproductive: Hysterectomy.  Probable salpingo oophorectomies.  Bowel: Possible constipation. No bowel obstruction. No evidence of bowel inflammation.  Retroperitoneum: No mass or adenopathy.  Peritoneum: No ascites or pneumoperitoneum.  Vascular: No acute abnormality.  OSSEOUS: There is diffuse osteolytic and sclerotic bone metastases.  Notable deposits:  1. Extensive demineralization of the right pelvis, centered on the acetabulum, with extra osseous growth. No evidence of acute fracture. 2. 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 There is extra osseous tumor at the level of the left pedicle, which infiltrates the canal and left foramen. 3. L1 compression fracture with height loss approximately 25%. 4. Multiple spinal metastases likely have extra osseous tumor extension, including T5 with perispinal growth on the right. The  T5 body is mildly compressed on the right, without discrete fracture line.  Critical Value/emergent results were called by telephone at the time of interpretation on 08/29/2013 at 1:24 am to PKinston who verbally acknowledged these results.  IMPRESSION: 1. Segmental pulmonary embolism to the right lower lobe. 2. Findings consistent with right breast carcinoma with axillary/pectoral nodal spread and diffuse osseous metastatic disease. 3. L1 and L3 pathologic compression fractures. Retropulsion and extraosseous tumor at L3 causes advanced spinal canal and left foraminal stenosis. 4. Extensive demineralization of the right acetabulum, at significant risk for pathologic fracture. 5. 5 cm hemangioma in the right liver. Two tiny low densities in the liver which can be followed by imaging. 6. Focal urothelial thickening in the right bladder, a possible second malignancy. 7. Borderline enlarged right supraclavicular lymph node.   Electronically Signed   By:  Jorje Guild M.D.   On: 08/29/2013 01:31   Mr Jeri Cos UL Contrast  08/29/2013   CLINICAL DATA:  Extensive metastatic disease.  EXAM: MRI HEAD WITHOUT AND WITH CONTRAST  TECHNIQUE: Multiplanar, multiecho pulse sequences of the brain and surrounding structures were obtained without and with intravenous contrast.  CONTRAST:  67m MULTIHANCE GADOBENATE DIMEGLUMINE 529 MG/ML IV SOLN  COMPARISON:  CT head without contrast 08/28/2013  FINDINGS: Multiple skull metastases are again noted. The largest lesions are in the parietal bones and at the apex of the occipital bone just below the lambdoid suture. The lesion in the right parietal bone extends to the dura with some dural thickening. There PE occipital lesion also extends to the dura adjacent to the superior sagittal sinus. Sinus is patent.  No parenchymal lesions are evident. No acute infarct, hemorrhage or parenchymal mass lesion is present. There is no significant white matter disease.  Flow is present in the major  intracranial arteries. The globes and orbits are intact. The paranasal sinuses and mastoid air cells are clear.  IMPRESSION: 1. Extensive skull metastases. 2. At least 2 of these lesions extend to the dura with focal dural enhancement suggesting dural metastases or reactive change associated with the adjacent skull metastasis. 3. No evidence for parenchymal metastases. 4. The MRI appearance of the brain is normal for age.   Electronically Signed   By: CLawrence SantiagoM.D.   On: 08/29/2013 13:55   Mr Cervical Spine W Wo Contrast  08/29/2013   CLINICAL DATA:  Metastatic disease to the bones.  EXAM: MRI CERVICAL SPINE WITHOUT AND WITH CONTRAST  TECHNIQUE: Multiplanar and multiecho pulse sequences of the cervical spine, to include the craniocervical junction and cervicothoracic junction, were obtained according to standard protocol without and with intravenous contrast.  CONTRAST:  170mMULTIHANCE GADOBENATE DIMEGLUMINE 529 MG/ML IV SOLN  COMPARISON:  None.  FINDINGS: There are metastatic lesions involving every cervical vertebra as well as the clivus and the visualized portions of the occipital and parietal bones. There is a pathologic compression fracture of the anterior aspect of T1. There is no tumor protrusion into the spinal canal or neural foramina.  There is a small central disc protrusion at C5-6 which indents the ventral aspect of the spinal cord without myelopathy. This protrusion extends into the right neural foramen and osteophytes extend into the left neural foramen is could affect either or both C6 nerves.  Tumor is most prominent in the right lateral mass of C6 and in the left lateral mass of C7. There is edema in the posterior paraspinal musculature throughout the cervical spine.  The metastases enhance after contrast administration.  Lymphoid tissue is prominent at the base of the tongue.  There is a complex nodule in the right side of the thyroid gland.  The patient has had previous posterior  decompression at C3-4.  There is a tiny focal area of abnormal signal in the left side of the spinal cord at that level which is most likely myelopathy secondary to the now relieved neural impingement.  IMPRESSION: 1. Osseous metastases in the visualized portions of the skull and at each level level of the cervical spine. No visible neural impingement by tumor at this time. 2. Pathologic fracture of the anterior aspect of T1. T1 is diffusely infiltrated by tumor. 3. No epidural tumor.   Electronically Signed   By: JiRozetta Nunnery.D.   On: 08/29/2013 13:33   Mr Thoracic Spine W Wo Contrast  08/29/2013  CLINICAL DATA:  Diffuse osseous metastases from unknown primary tumor.  EXAM: MRI THORACIC SPINE WITHOUT AND WITH CONTRAST  TECHNIQUE: Multiplanar and multiecho pulse sequences of the thoracic spine were obtained without and with intravenous contrast.  CONTRAST:  41m MULTIHANCE GADOBENATE DIMEGLUMINE 529 MG/ML IV SOLN  COMPARISON:  CT scan dated 08/28/2013  FINDINGS: There are metastatic lesions involving every vertebra in the thoracic spine. There is a pathologic compression fracture of the anterior aspect of T1. There is extensive tumor infiltration and T5, T7, T8, T10, T12, and L1.  Tumor extends into the left pedicle at T11 and slightly narrows the left neural foramen and slightly compresses the left posterior lateral aspect of the thecal sac but does not compress the spinal cord. There is a subtle pathologic compression fracture of the right side of the superior endplate of T5. No protrusion of bone or tumor or disc material into the spinal canal. No other pathologic fractures no visible epidural tumor.  IMPRESSION: 1. Extensive metastatic disease throughout the thoracic spine with a subtle pathologic fracture of the superior aspect of T5. 2. No epidural tumor or neural impingement.   Electronically Signed   By: JRozetta NunneryM.D.   On: 08/29/2013 13:57   Mr Lumbar Spine W Wo Contrast  08/29/2013   CLINICAL  DATA:  Widespread metastatic disease to the bones.  EXAM: MRI LUMBAR SPINE WITHOUT AND WITH CONTRAST  TECHNIQUE: Multiplanar and multiecho pulse sequences of the lumbar spine were obtained without and with intravenous contrast.  CONTRAST:  160mMULTIHANCE GADOBENATE DIMEGLUMINE 529 MG/ML IV SOLN  COMPARISON:  CT scan dated 08/28/2013  FINDINGS: Conus tip is at the inferior aspect of L2.  Metastatic disease extensively involves the entire lumbar spine as well as the sacrum in the adjacent iliac bones. There is a pathologic compression fracture of the L3 vertebra with protrusion of the posterior aspect of the infiltrated vertebral body into the spinal canal compressing the left side of the thecal sac in the left lateral recess. The tumor extends into the left pedicle, lamina, and facets and also extends into the left neural foramen at L3-4.  There is a slight compression deformity of the central portion of the L1 vertebra but there is no protrusion of bone or tumor or disc into the spinal canal at that level.  There is degenerative disc disease at L5-S1 with disc space narrowing and a 4 mm retrolisthesis with a small broad-based disc bulge with accompanying osteophytes. This touches both S1 nerves but does not compress them. There is fairly severe left foraminal stenosis at L5-S1.  There is diffuse edema in the paraspinal musculature.  After contrast administration there numerous metastases enhance.  IMPRESSION: 1. Diffuse metastatic disease throughout the spine with a compression fracture of L3. Infiltrated bone protrudes into the spinal canal and compresses the left lateral recess in the left neural foramen at L3-4. 2. Slight pathologic compression fracture of the central portion of L1 without neural impingement. 3. There is no definitive epidural tumor.   Electronically Signed   By: JiRozetta Nunnery.D.   On: 08/29/2013 13:40   Ct Abdomen Pelvis W Contrast  08/29/2013   CLINICAL DATA:  Evaluate for source of  malignancy.  EXAM: CT CHEST, ABDOMEN, AND PELVIS WITH CONTRAST  TECHNIQUE: Multidetector CT imaging of the chest, abdomen and pelvis was performed following the standard protocol during bolus administration of intravenous contrast.  CONTRAST:  8055mMNIPAQUE IOHEXOL 300 MG/ML  SOLN  COMPARISON:  None.  FINDINGS:  CT CHEST FINDINGS  THORACIC INLET/BODY WALL:  There is asymmetric, spiculated sub areolar tissue on the right. There is right axillary and deep pectoral lymphadenopathy with cortical thickening up to 1 cm. There is a 7 mm right supraclavicular lymph node which is moderately suspicious.  Bilateral thyroid nodules with coarse calcification, measuring up to 2 cm on the right. This is likely incidental given the patient's overall clinical status.  MEDIASTINUM:  Normal heart size. No pericardial effusion. There is residual thymus noted. Filling present within segmental arteries of the right lower lobe. No lymphadenopathy.  LUNG WINDOWS:  No definitive metastasis. Small bilateral pleural effusions with dependent atelectasis.  OSSEOUS:  See below  CT ABDOMEN AND PELVIS FINDINGS  BODY WALL: Unremarkable.  Liver: There is a 5 cm mass in segment 6 which has peripheral nodular enhancement and centripetal fill-in. There is a 5 mm and 7 mm low-density nodules within the right hepatic lobe which may enhance on delayed imaging.  Biliary: Prominent intrahepatic biliary tree without visible cause.  Pancreas: Prominent diameter of the main pancreatic duct, measuring 4 mm. There is no visible mass to cause obstruction.  Spleen: Unremarkable.  Adrenals: Unremarkable.  Kidneys and ureters: 4 mm nonobstructive stone in the interpolar left kidney.  Bladder: Focal thickening and enhancement along the right bladder base, measuring up to 7 mm in thickness. There is no excretory contrast to suggest layering hyperdense urine.  Reproductive: Hysterectomy.  Probable salpingo oophorectomies.  Bowel: Possible constipation. No bowel  obstruction. No evidence of bowel inflammation.  Retroperitoneum: No mass or adenopathy.  Peritoneum: No ascites or pneumoperitoneum.  Vascular: No acute abnormality.  OSSEOUS: There is diffuse osteolytic and sclerotic bone metastases.  Notable deposits:  1. Extensive demineralization of the right pelvis, centered on the acetabulum, with extra osseous growth. No evidence of acute fracture. 2. 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 There is extra osseous tumor at the level of the left pedicle, which infiltrates the canal and left foramen. 3. L1 compression fracture with height loss approximately 25%. 4. Multiple spinal metastases likely have extra osseous tumor extension, including T5 with perispinal growth on the right. The T5 body is mildly compressed on the right, without discrete fracture line.  Critical Value/emergent results were called by telephone at the time of interpretation on 08/29/2013 at 1:24 am to [place], who verbally acknowledged these results.  IMPRESSION: 1. Segmental pulmonary embolism to the right lower lobe. 2. Findings consistent with right breast carcinoma with axillary/pectoral nodal spread and diffuse osseous metastatic disease. 3. L1 and L3 pathologic compression fractures. Retropulsion and extraosseous tumor at L3 causes advanced spinal canal and left foraminal stenosis. 4. Extensive demineralization of the right acetabulum, at significant risk for pathologic fracture. 5. 5 cm hemangioma in the right liver. Two tiny low densities in the liver which can be followed by imaging. 6. Focal urothelial thickening in the right bladder, a possible second malignancy. 7. Borderline enlarged right supraclavicular lymph node.   Electronically Signed   By: Jorje Guild M.D.   On: 08/29/2013 01:31   Ct Biopsy  09/01/2013   CLINICAL DATA:  Extensive osseous metastatic disease, large right iliac/ acetabular  destructive bone mass  EXAM: CT GUIDED CORE BIOPSY OF RIGHT ILIAC DESTRUCTIVE BONE MASS  ANESTHESIA/SEDATION: Two  Mg IV Versed; 100 mcg IV Fentanyl  Total Moderate Sedation Time: 15 minutes.  PROCEDURE: The procedure risks, benefits, and alternatives were explained to the  patient. Questions regarding the procedure were encouraged and answered. The patient understands and consents to the procedure.  The right hip region was prepped with Betadinein a sterile fashion, and a sterile drape was applied covering the operative field. A sterile gown and sterile gloves were used for the procedure. Local anesthesia was provided with 1% Lidocaine.  Previous imaging reviewed. Patient positioned supine. Noncontrast localization CT performed. The large right iliac destructive bone mass was localized. Under sterile conditions and local anesthesia, a 17 gauge 6.8 cm access needle was advanced percutaneously from a right anterior oblique approach. Needle position confirmed within the bone mass. 18 gauge core biopsies obtained. Samples placed in formalin. No immediate complication. Patient tolerated the biopsy well.  Complications: No immediate  FINDINGS: Imaging confirms needle placed in in the right iliac destructive bone mass.  IMPRESSION: Successful right iliac destructive bone mass 18 gauge core biopsy.   Electronically Signed   By: Daryll Brod M.D.   On: 09/01/2013 15:32   Dg Chest Port 1 View  08/27/2013   CLINICAL DATA:  Fever.  Recent diagnosis of cancer  EXAM: PORTABLE CHEST - 1 VIEW  COMPARISON:  None.  FINDINGS: Small streaky opacities in the lower lungs. No definitive pneumonia. No edema, effusion, or pneumothorax. There is pulmonary hyperinflation suggesting COPD. Normal heart size and upper mediastinal contours when accounting for distortion from rightward rotation.  IMPRESSION: COPD and mild basilar atelectasis. No definitive pneumonia to explain fever.   Electronically Signed   By: Jorje Guild M.D.   On:  08/27/2013 21:49   ECHO 10/28/2013  LV EF: 40% - 45%  ------------------------------------------------------------------- Indications: Neoplasm - breast 174.9.  ------------------------------------------------------------------- History: Risk factors: Current tobacco use. Dyslipidemia.  ------------------------------------------------------------------- Study Conclusions  - Left ventricle: The cavity size was normal. Systolic function was mildly to moderately reduced. The estimated ejection fraction was in the range of 40% to 45%. Wall motion was normal; there were no regional wall motion abnormalities. Doppler parameters are consistent with abnormal left ventricular relaxation (grade 1 diastolic dysfunction). - Aortic valve: There was no regurgitation. - Aortic root: The aortic root was normal in size. - Mitral valve: There was no regurgitation. - Left atrium: The atrium was normal in size. - Right ventricle: Systolic function was mildly to moderately reduced. - Right atrium: The atrium was normal in size. - Tricuspid valve: There was no regurgitation. - Pulmonic valve: There was no regurgitation. - Pulmonary arteries: Systolic pressure was within the normal range. - Pericardium, extracardiac: There was no pericardial effusion.  Impressions:  - This is a very limited quality study. Left and right ventricular function appears to be at least mildly impaired. We are unable to comment on regional wall motion abnormalities. No significant valvular abnormality.An alternative imaging modality such as cardiac MRI is recommended.  PATHOLOGY: 09/02/2013  Bone, biopsy, Destructive rt iliac bone lesion - METASTATIC CARCINOMA.- SEE COMMENT.1 of 2 FINAL for [place], [place] ([id number])Microscopic CommentGiven the clinical findings, the features are consistent with metastatic grade II breast ductal carcinoma. Abprognostic profile will be performed and the results reported separately. (JBK:gt,  09/02/13)JOSHUA KISH MD Pathologist, Electronic Signature (Case signed 09/02/2013)  PROGNOSTIC INDICATORS - ACIS Results: IMMUNOHISTOCHEMICAL AND MORPHOMETRIC ANALYSIS BY THE AUTOMATED CELLULAR IMAGING SYSTEM (ACIS) Estrogen Receptor: 100%, POSITIVE, STRONG STAINING INTENSITY Progesterone Receptor: 0%, NEGATIVE Proliferation Marker Ki67: 53% HER2 NEU CISH shows amplification ratio 3.63, copy no 5.45    ASSESSMENT: Kristie Jackson 58 y.o. female with a history of 1. MBC ER+, HER2+ Extensive skeletal metastasis.Recent  2 hospitalizations for c diff colitis and diarrhea/dehydration. Lapatinib could have exacerbated this diarrhea. I was going to awitch to Herceptin but then an echocardiogram revealed EF 40-45% and she has been seen by a cardiologist who is recommending a cardiac MRI for further evaluation.  PLAN:   1. Metastatic Breast Cancer  complicated by L3 compression and moderate to advanced spinal stenosis and Hypercalcemia today. -Mets to the spine, right acetabulum with cord compression, dura, axillary, supraclavicular LN, liver  --We will CONTINUE femara 2.5 mg daily. Her HER-2 was positive and she was started on Tykerb 1250 mg by mouth daily. After the 2 recent hospitalizations, I stopped  Tykerb and plan was to start IV Herceptin but on hold till cardiac dysfunction resolves. Could be related to Tykerb or some intrinsic heart problem. --Palliative XRT per Radiation oncology completed.  --We will consider addition of zometa or denosumab based on extensive skeletal metastases to help reduce risk of skeletal related events.  She will require a baseline dental evaluation. I emphasized to her that it is very important and she will make an appointment with her dentist.  --I am planning to do a PET scan on 12/15/13 for restaging of her disease and seeing if she has stable disease, progressive disease or responsive disease. That will further dictate my plan for her cancer. We have good options like  Perjeta and TDM-1 antibody.  My first preference would be Herceptin upon recovery of EF in normal range.  -- Delton See started today for hypercalecmia and bone metastasis after explaining risks vs benefits. In her case the benefits outweigh the risks which include but are not limited to risk of hypocalcemia, hypophosphatemia, risk for ONJ (Osteonecrosis of Jaw) and infusion reactions rarely.  2. R lower lobe segmental PE secondary to #1 and immobilization  --Lovenox 60 mg q 12 hours.   3. ECOG 3.   4. Hypothyroidism. --Continue Levothyroxine 47 mcg daily except Monday and Thursday.   5. Bladder/ Bowel incontinence secondary to #1.   6. Pain secondary to #1.  --Continue Percocet 1-2 tabs every 6 hours as need for pain.  Continue neurotin 1,2000 mg tid. I gave oral dilaudid to her on last visit.   7. 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 --Secondary to #1.  Completed XRT to affected site.  Patient was on dexamethasone 4 mg every 6 hours which was tapered down.   8. History of central cord injury. After a horse fall in 1995.  9. Depression. --Continue cymbalta 60 mg daily.   10. Low Cardiac EF. --Cards following, may improve on it's own if related to Lapatinib. Cardiac MRI has been ordered. We will re-assess in 1 month if we can start her back on some HER-2 directed therapy. CAD will need to be ruled out as well though she does not have a strong family history or other contributing risk factors.  11. Follow up.  --We'll followup with the patient in 4 weeks. 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    All questions were answered. The patient knows to call the clinic with any problems, questions or concerns. We can certainly see the patient much sooner if necessary. She understands the goal of this therapy is palliative not curative and if she does not tolerate the treatment well or keep getting  re-hospitalized with complications and poor tolerance to systemic therapy, it would be very reasonable to initiate a discussion about hospice care. Most  patients with her 2 positive disease stage 4 can survive for many years if they can tolerate a regimen and respond to it.    I spent 35 minutes counseling the patient face to face. The total time spent in the appointment was 50 minutes.    Bernadene Bell, MD Medical Hematologist/Oncologist [place] Pager: [phone] Office No: [phone]

## 2013-11-27 NOTE — Telephone Encounter (Signed)
1 

## 2013-12-02 ENCOUNTER — Telehealth (HOSPITAL_COMMUNITY): Payer: Self-pay

## 2013-12-02 ENCOUNTER — Encounter: Payer: Self-pay | Admitting: *Deleted

## 2013-12-02 NOTE — Telephone Encounter (Signed)
A few attempts made in last few days to have patient return call regarding cardiolite ST needed per Dr. Aundra Dubin.

## 2013-12-03 ENCOUNTER — Encounter: Payer: Self-pay | Admitting: Cardiology

## 2013-12-10 ENCOUNTER — Encounter (HOSPITAL_COMMUNITY): Payer: Medicare Other

## 2013-12-15 ENCOUNTER — Ambulatory Visit (HOSPITAL_COMMUNITY): Admission: RE | Admit: 2013-12-15 | Payer: Medicare Other | Source: Ambulatory Visit

## 2013-12-17 ENCOUNTER — Encounter (HOSPITAL_COMMUNITY)
Admission: RE | Admit: 2013-12-17 | Discharge: 2013-12-17 | Disposition: A | Discharge status: Home / Self Care | Payer: Medicare Other | Source: Ambulatory Visit | Attending: Diagnostic Radiology | Admitting: Diagnostic Radiology

## 2013-12-17 ENCOUNTER — Encounter (HOSPITAL_COMMUNITY): Payer: Self-pay

## 2013-12-17 DIAGNOSIS — J90 Pleural effusion, not elsewhere classified: Secondary | ICD-10-CM | POA: Insufficient documentation

## 2013-12-17 DIAGNOSIS — N200 Calculus of kidney: Secondary | ICD-10-CM | POA: Insufficient documentation

## 2013-12-17 DIAGNOSIS — C50919 Malignant neoplasm of unspecified site of unspecified female breast: Secondary | ICD-10-CM | POA: Insufficient documentation

## 2013-12-17 DIAGNOSIS — C7951 Secondary malignant neoplasm of bone: Secondary | ICD-10-CM | POA: Diagnosis not present

## 2013-12-17 DIAGNOSIS — C787 Secondary malignant neoplasm of liver and intrahepatic bile duct: Secondary | ICD-10-CM | POA: Diagnosis not present

## 2013-12-17 MED ORDER — FLUDEOXYGLUCOSE F - 18 (FDG) INJECTION
6.0800 | Freq: Once | INTRAVENOUS | Status: AC | PRN
  Administered 2013-12-17: 6.08 via INTRAVENOUS

## 2013-12-18 ENCOUNTER — Ambulatory Visit: Payer: Medicare Other

## 2013-12-18 ENCOUNTER — Telehealth: Payer: Self-pay | Admitting: Hematology

## 2013-12-18 ENCOUNTER — Other Ambulatory Visit: Payer: Medicare Other

## 2013-12-18 LAB — GLUCOSE, CAPILLARY: GLUCOSE-CAPILLARY: 96 mg/dL (ref 70–99)

## 2013-12-18 NOTE — Telephone Encounter (Signed)
returned pt call adn r/s appt pt .Kristie Jackson..pt aware of new d.t

## 2013-12-23 ENCOUNTER — Other Ambulatory Visit: Payer: Self-pay | Admitting: Internal Medicine

## 2013-12-24 ENCOUNTER — Ambulatory Visit (HOSPITAL_COMMUNITY)
Admission: RE | Admit: 2013-12-24 | Discharge: 2013-12-24 | Disposition: A | Discharge status: Home / Self Care | Payer: Medicare Other | Source: Ambulatory Visit | Attending: Cardiology | Admitting: Cardiology

## 2013-12-24 DIAGNOSIS — I509 Heart failure, unspecified: Secondary | ICD-10-CM | POA: Diagnosis present

## 2013-12-24 DIAGNOSIS — I358 Other nonrheumatic aortic valve disorders: Secondary | ICD-10-CM | POA: Insufficient documentation

## 2013-12-24 DIAGNOSIS — I351 Nonrheumatic aortic (valve) insufficiency: Secondary | ICD-10-CM

## 2013-12-24 MED ORDER — GADOBENATE DIMEGLUMINE 529 MG/ML IV SOLN
20.0000 mL | Freq: Once | INTRAVENOUS | Status: AC | PRN
  Administered 2013-12-24: 20 mL via INTRAVENOUS

## 2013-12-25 ENCOUNTER — Ambulatory Visit: Payer: Medicare Other

## 2013-12-25 ENCOUNTER — Telehealth: Payer: Self-pay

## 2013-12-25 ENCOUNTER — Telehealth: Payer: Self-pay | Admitting: Hematology

## 2013-12-25 ENCOUNTER — Other Ambulatory Visit: Payer: Medicare Other

## 2013-12-25 NOTE — Telephone Encounter (Signed)
Pt's nurse called to r/s labs/ov/inj due to pt's is sick, nurse confirmed r/s apt ...Marland KitchenMarland KitchenMarland Kitchen KJ

## 2013-12-25 NOTE — Telephone Encounter (Signed)
Caregiver called stating pt not feeling well and wants to r/s appt. Call routed to scheduler.

## 2013-12-29 ENCOUNTER — Other Ambulatory Visit: Payer: Medicare Other

## 2013-12-29 ENCOUNTER — Ambulatory Visit: Payer: Medicare Other

## 2013-12-30 ENCOUNTER — Other Ambulatory Visit: Payer: Self-pay | Admitting: *Deleted

## 2013-12-30 DIAGNOSIS — C50919 Malignant neoplasm of unspecified site of unspecified female breast: Secondary | ICD-10-CM

## 2013-12-30 MED ORDER — LETROZOLE 2.5 MG PO TABS: 2.5000 mg | ORAL_TABLET | Freq: Every day | ORAL | Status: AC

## 2013-12-31 ENCOUNTER — Inpatient Hospital Stay (HOSPITAL_COMMUNITY): Admission: RE | Admit: 2013-12-31 | Payer: Medicare Other | Source: Ambulatory Visit

## 2014-01-01 ENCOUNTER — Encounter: Payer: Self-pay | Admitting: *Deleted

## 2014-01-01 ENCOUNTER — Ambulatory Visit (HOSPITAL_BASED_OUTPATIENT_CLINIC_OR_DEPARTMENT_OTHER): Payer: Medicare Other | Admitting: Physician Assistant

## 2014-01-01 ENCOUNTER — Other Ambulatory Visit (HOSPITAL_BASED_OUTPATIENT_CLINIC_OR_DEPARTMENT_OTHER): Payer: Medicare Other

## 2014-01-01 ENCOUNTER — Ambulatory Visit: Payer: Medicare Other

## 2014-01-01 ENCOUNTER — Encounter: Payer: Self-pay | Admitting: Radiation Oncology

## 2014-01-01 ENCOUNTER — Ambulatory Visit
Admission: RE | Admit: 2014-01-01 | Discharge: 2014-01-01 | Disposition: A | Discharge status: Home / Self Care | Payer: Medicare Other | Source: Ambulatory Visit | Attending: Radiation Oncology | Admitting: Radiation Oncology

## 2014-01-01 ENCOUNTER — Encounter: Payer: Self-pay | Admitting: Physician Assistant

## 2014-01-01 VITALS — BP 100/68 | HR 101 | Temp 98.2°F | Resp 10

## 2014-01-01 VITALS — BP 97/71 | HR 98 | Temp 97.6°F | Resp 19

## 2014-01-01 DIAGNOSIS — C799 Secondary malignant neoplasm of unspecified site: Secondary | ICD-10-CM

## 2014-01-01 DIAGNOSIS — C50919 Malignant neoplasm of unspecified site of unspecified female breast: Secondary | ICD-10-CM

## 2014-01-01 LAB — CBC WITH DIFFERENTIAL/PLATELET
BASO%: 0.4 % (ref 0.0–2.0)
Basophils Absolute: 0 10*3/uL (ref 0.0–0.1)
EOS%: 1.5 % (ref 0.0–7.0)
Eosinophils Absolute: 0.1 10*3/uL (ref 0.0–0.5)
HCT: 40.1 % (ref 34.8–46.6)
HGB: 12.8 g/dL (ref 11.6–15.9)
LYMPH#: 0.9 10*3/uL (ref 0.9–3.3)
LYMPH%: 16.3 % (ref 14.0–49.7)
MCH: 30.4 pg (ref 25.1–34.0)
MCHC: 31.9 g/dL (ref 31.5–36.0)
MCV: 95.2 fL (ref 79.5–101.0)
MONO#: 0.5 10*3/uL (ref 0.1–0.9)
MONO%: 9.3 % (ref 0.0–14.0)
NEUT#: 4 10*3/uL (ref 1.5–6.5)
NEUT%: 72.5 % (ref 38.4–76.8)
Platelets: 324 10*3/uL (ref 145–400)
RBC: 4.21 10*6/uL (ref 3.70–5.45)
RDW: 14.3 % (ref 11.2–14.5)
WBC: 5.5 10*3/uL (ref 3.9–10.3)

## 2014-01-01 LAB — COMPREHENSIVE METABOLIC PANEL (CC13)
ALK PHOS: 179 U/L — AB (ref 40–150)
ALT: 20 U/L (ref 0–55)
AST: 22 U/L (ref 5–34)
Albumin: 3.1 g/dL — ABNORMAL LOW (ref 3.5–5.0)
Anion Gap: 9 mEq/L (ref 3–11)
BUN: 9.2 mg/dL (ref 7.0–26.0)
CALCIUM: 9.8 mg/dL (ref 8.4–10.4)
CHLORIDE: 103 meq/L (ref 98–109)
CO2: 25 mEq/L (ref 22–29)
Creatinine: 0.5 mg/dL — ABNORMAL LOW (ref 0.6–1.1)
Glucose: 158 mg/dl — ABNORMAL HIGH (ref 70–140)
POTASSIUM: 3.2 meq/L — AB (ref 3.5–5.1)
Sodium: 136 mEq/L (ref 136–145)
Total Bilirubin: <0.2 mg/dL (ref 0.20–1.20)
Total Protein: 6.6 g/dL (ref 6.4–8.3)

## 2014-01-01 LAB — PHOSPHORUS: Phosphorus: 2.9 mg/dL (ref 2.3–4.6)

## 2014-01-01 NOTE — Progress Notes (Signed)
Radiation Oncology         [phone]) [phone] ________________________________  Name: Kristie Jackson  MRN: [id number]  Date: 01/01/2014  DOB: 22-Feb-1955  Follow-Up Visit Note  CC: [name], MD  Horton Finer,*  Diagnosis:   58 yo woman with skeletal metastases from breast cancer s/p palliative radiotherapy  09/04/13-09/19/13  Thoracic spine from T4 to T6 inclusive was treated to 30 Gy in 10 fractions of 3 Gy.  The lower spine from T12 through the sacrum and right hip were treated to 30 Gy in 10 fractions of 3 Gy.     ICD-9-CM ICD-10-CM   1. Breast cancer metastasized to multiple sites, unspecified laterality 174.9 C50.919    199.0 C79.9     Interval Since Last Radiation:  3  months  Narrative:  The patient returns today for routine follow-up.  She rates her pain as a 2 on a scale of 0-10.  She uses occasional percocet. Pt complains of, Loss of Sleep, Fatigue, Generalized Weakness, Poor Appetite, Pain Muscle ache and Pain Occurs Intermittently. Pt reports she had Cdiff, initially in August, tested negative in Oct. She continues having diarrhea 7-8 times a day. Denies skin breakdown.  Reports no abnormal urinary symptoms, pt wears depends. Pt continues to smoke. She receives 24 hrs nursing aide services in her home    She had limited echo suggesting low EF, but MRI shows ~63% LVEF, so, she may be eligilble to start herceptin in addition to [place] and Femara.                            ALLERGIES:  is allergic to morphine and related.  Meds: Current Outpatient Prescriptions  Medication Sig Dispense Refill  . aspirin-acetaminophen-caffeine (EXCEDRIN MIGRAINE) 250-250-65 MG per tablet Take 1 tablet by mouth every 6 (six) hours as needed for headache.    . carvedilol (COREG) 3.125 MG tablet Take 1 tablet (3.125 mg total) by mouth 2 (two) times daily with a meal. 60 tablet 0  . diazepam (VALIUM) 10 MG tablet Take 10 mg by mouth every 6 (six)  hours as needed. For muscle spasms    . DULoxetine (CYMBALTA) 60 MG capsule Take 60 mg by mouth daily.    Marland Kitchen enoxaparin (LOVENOX) 60 MG/0.6ML injection Inject 0.6 mLs (60 mg total) into the skin every 12 (twelve) hours. 60 Syringe 5  . gabapentin (NEURONTIN) 400 MG capsule Take 3 capsules (1,200 mg total) by mouth 3 (three) times daily. 90 capsule 0  . lactose free nutrition (BOOST PLUS) LIQD Take 237 mLs by mouth 3 (three) times daily with meals. 90 Can 1  . letrozole (FEMARA) 2.5 MG tablet Take 1 tablet (2.5 mg total) by mouth daily. 30 tablet 1  . levothyroxine (SYNTHROID, LEVOTHROID) 75 MCG tablet Take 75 mcg by mouth See admin instructions. Takes daily except Monday and Thursday    . Multiple Vitamin (MULTIVITAMIN WITH MINERALS) TABS tablet Take 1 tablet by mouth daily.    . Omega-3 Fatty Acids (OMEGA 3 PO) Take 1 capsule by mouth daily.    . ondansetron (ZOFRAN) 4 MG tablet Take 1 tablet (4 mg total) by mouth every 8 (eight) hours as needed for nausea or vomiting. 20 tablet 0  . oxyCODONE-acetaminophen (PERCOCET/ROXICET) 5-325 MG per tablet Take 1-2 tablets by mouth every 4 (four) hours as needed for moderate pain or severe pain. 30 tablet 0  . Probiotic Product (PROBIOTIC DAILY PO) Take 1 capsule by mouth  daily.    . rizatriptan (MAXALT) 10 MG tablet Take 10 mg by mouth as needed for migraine. May repeat in 2 hours if needed    . sucralfate (CARAFATE) 1 G tablet Take 1 g by mouth 4 (four) times daily -  with meals and at bedtime.    Marland Kitchen tiZANidine (ZANAFLEX) 4 MG tablet Take 8 mg by mouth 3 (three) times daily.    Marland Kitchen topiramate (TOPAMAX) 100 MG tablet Take 100 mg by mouth at bedtime.    . Triamcinolone Acetonide (TRIAMCINOLONE 0.1 % CREAM : EUCERIN) CREA Apply 1 application topically 2 (two) times daily.    . vancomycin (VANCOCIN) 125 MG capsule Take 1 capsule (125 mg total) by mouth 4 (four) times daily. 28 capsule 0   No current facility-administered medications for this encounter.     Physical Findings: The patient is in no acute distress. Patient is alert and oriented.  oral temperature is 98.2 F (36.8 C). Her blood pressure is 100/68 and her pulse is 101. Her respiration is 10 and oxygen saturation is 97%. .  No significant changes.  Lab Findings: Lab Results  Component Value Date   WBC 5.5 01/01/2014   HGB 12.8 01/01/2014   HCT 40.1 01/01/2014   MCV 95.2 01/01/2014   PLT 324 01/01/2014   Radiographic Findings: Nm Pet Image Restag (ps) Skull Base To Thigh  12/17/2013   CLINICAL DATA:  Initial treatment strategy for breast cancer.  EXAM: NUCLEAR MEDICINE PET SKULL BASE TO THIGH  TECHNIQUE: 6.1 mCi F-18 FDG was injected intravenously. Full-ring PET imaging was performed from the skull base to thigh after the radiotracer. CT data was obtained and used for attenuation correction and anatomic localization.  FASTING BLOOD GLUCOSE:  Value: 96 mg/dl  COMPARISON:  CT chest abdomen pelvis 08/28/2013.  FINDINGS: NECK  No hypermetabolic lymph nodes in the neck. CT images show no acute findings.  CHEST  Retroareolar soft tissue in the right breast measures approximately 1.2 x 2.1 cm with an SUV max of 5.0. No hypermetabolic mediastinal, hilar or axillary lymph nodes. No hypermetabolic pulmonary nodules. CT images show partially calcified thyroid nodules, measuring up to 2.4 cm on the right. Tiny bilateral pleural effusions. Subsegmental atelectasis in both lower lobes.  ABDOMEN/PELVIS  There are new low-attenuation lesions in the liver, measuring up to 1.8 x 3.0 cm in the right hepatic lobe with an SUV max of 7.0. No abnormal hypermetabolism knee adrenal glands, spleen or pancreas. No hypermetabolic lymph nodes.  CT images show a 5.0 cm exophytic lesion off the inferior right hepatic lobe, unchanged and representative of a hemangioma on 08/28/2013. Gallbladder and adrenal glands are unremarkable. Stones are seen in the kidneys bilaterally. Spleen, pancreas, stomach and bowel are  grossly unremarkable. No free fluid.  SKELETON  Lytic lesions are seen throughout the visualized osseous structures. Index lesion in the anterior left first rib has an SUV max of 5.3. Lucency, irregularity and cortical thinning in the right acetabulum without a definite fracture. Cervical and lumbar spine better evaluated on dedicated imaging performed 08/29/2013.  IMPRESSION: 1. Hypermetabolic retroareolar right breast lesion with new hepatic metastatic disease. 2. Diffuse osseous metastatic disease. Changes at the right acetabulum likely put the patient at increased risk for a pathologic fracture. 3. Tiny bilateral pleural effusions. 4. Bilateral renal stones. 5. Cervical and lumbar spine better evaluated on dedicated imaging performed 08/29/2013.   Electronically Signed   By: Lorin Picket M.D.   On: 12/17/2013 14:33   Mr Card  Morphology Wo/w Cm  12/25/2013   CLINICAL DATA:  Chemotherapy, echo was not interpretable. Assess LV function.  EXAM: CARDIAC MRI  TECHNIQUE: The patient was scanned on a 1.5 Tesla GE magnet. A dedicated cardiac coil was used. Functional imaging was done using Fiesta sequences. 2,3, and 4 chamber views were done to assess for RWMA's. Modified Simpson's rule using a short axis stack was used to calculate an ejection fraction on a dedicated work Conservation officer, nature. The patient received 25 cc of Multihance. After 10 minutes inversion recovery sequences were used to assess for infiltration and scar tissue.  FINDINGS: Limited views of the lung fields showed no gross abnormalities. There was prominent epicardial adipose tissue.  Normal left ventricular size and wall thickness. Normal systolic function, EF 55% with no wall motion abnormalities. Normal right ventricular size and systolic function. There was mild aortic insufficiency and no aortic stenosis. There did not appear to be significant mitral regurgitation. The atria were normal in size.  There was no myocardial delayed  enhancement.  MEASUREMENTS: MEASUREMENTS LV EDV 77 mL  LV SV 48 mL  LV EF 63%  IMPRESSION: 1.  Normal left ventricular size and systolic function, EF 73%.  2.  Normal right ventricular size and systolic function.  3.  Mild aortic insufficiency.  Dalton Mclean   Electronically Signed   By: Loralie Champagne M.D.   On: 12/25/2013 07:09    Impression:  The patient has widespread metastatic breast cancer, with some pains related to skeletal involvement.  At present, none of the pain is severe, and she is ready to begin systemic therapy.  Plan:  I would reserve radiotherapy for pain management if it worsens.  The patient will continue to follow-up with medical oncology for ongoing care.  I will look forward to following progress through their correspondence, and be happy to participate in care if clinically indicated.  I talked to the patient about what to expect in the future.  I encouraged a phone call or return to the office with any question about previous radiation or possible radiation effects.  The patient was comfortable with this plan.   _____________________________________  Sheral Apley. Tammi Klippel, M.D.

## 2014-01-01 NOTE — Progress Notes (Signed)
She rates her pain as a 2 on a scale of 0-10. Pt complains of, Loss of Sleep, Fatigue, Generalized Weakness, Poor Appetite, Pain  Muscle ache and Pain Occurs  Intermittently. Pt reports the had Cdiff, initially in August, tested negative in Oct. She continues having diarrhea 7-8 times a day.  Denies skin breakdown.   Reports no abnormal urinary symptoms, pt wears depends. Pt continues to smoke.  She receives 24 hrs nursing aide services in her home.

## 2014-01-01 NOTE — Progress Notes (Signed)
[facility] OFF[facility] PROGRESS NOTE Date of Visit: 11/20/2013  [name], MD [address], [address] [place] [address]  DIAGNOSIS:  1. Metastatic Breast cancer (MBC) Her-2 positive disease. 2. Cardiomyopathy (unexplained etiology perhaps related to Lapatinib) 3. Recent hospitalization for C Diff colitis from 10/19/13 through 10/21/13. 4. To discuss further management for her [place].  CURRENT TREATMENT:   1. Femara 2.5 mg daily started on 09/05/2013. (I will continue that) 2. Tykerb 1250 mg by mouth daily beginning July 2015 (I discontinued that on 10/23/13 because of ongoing diarrhea and dehydration and 2 hospitalizations and low EF) 3. Herceptin not started yet until improvement seen in EF. 4. Delton See started today due to skeletal metastasis and Hypercalcemia.  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 (metastatic breast cancer) complicated by an L3 compression and moderate to advance spinal stenosis. Her pathology is 100% ER positive she is also HER-2 positive thus her combination therapy with Femara and Tykerb was initiated but now with this diarrhrea caused by c diff, it would be a good time to change the tykerb to herceptin. She is accompanied today by her caregiver.   She had two hospitalizations first from 09/21/13 through 10/15/13 with diarrhea and dehydration and then second one was from 10/19/13 through 10/21/13.  When discharged she was sent home on Ciprofloxacin x 2 days which she has completed and Flagyl x 11 days which she is still taking.   She has a history of spinal cord injury dating back to 1995 after a horse fall and recently she had a second complication in her spinal cord when she had spinal cord compression from her vertebral mets and required radiation to her T4-6 spine, T12 and right hip. She got 30 Gy with Dr Tammi Klippel which she completed on 09/19/13. Patient also have history of  pulmonary embolism on Lovenox injections 60 mg sq bid. She also have history of migraine headaches. MRI brain did not show brain mets but + skull mets on 08/29/13. The MRI of spine showed osseous mets in C spine, pathologic fracture T1 but no epidural tumor. Pathologic fracture L1. Pathologic fracture of T5. There was extensive disease noted at all levels. She got radiation to the areas which were painful or risk for cord compression.  Last restaging CT Chest 08/29/13 showed: IMPRESSION:  1. Segmental pulmonary embolism to the right lower lobe. 2. Findings consistent with right breast carcinoma with axillary/pectoral nodal spread and diffuse osseous metastatic disease. 3. L1 and L3 pathologic compression fractures. Retropulsion and extraosseous tumor at L3 causes advanced spinal canal and left foraminal stenosis. 4. Extensive demineralization of the right acetabulum, at significant risk for pathologic fracture. 5. 5 cm hemangioma in the right liver. Two tiny low densities in the  liver which can be followed by imaging. 6. Focal urothelial thickening in the right bladder, a possible second malignancy. 7. Borderline enlarged right supraclavicular lymph node. This was my first encounter with the patient and I reviewed all her records from clinic and the hospital.  After her last visit, when Dr. Lona Kettle reviewed the Echo, the EF was reported at 40-45% on 10/28/13. Herceptin was not started.Patient referred to a cardiologist Dr Loralie Champagne and seen on 11/19/13. She underwent cardiac MRI with and without contrast on 12/25/2013. This study revealed that she had normal systolic function with an EF of 63% with no wall motion abnormalities.  Patient states she has increased left-sided lower  back pain as well as left leg pain. She reports that her left leg also has more weakness and physical therapy not keen with working with her. She recently had a restaging PET scan to reevaluate her disease and presents to discuss the  results  Her diarrhea initially did get better with oral vancomycin. She continues to have diarrhea and is scheduled to have colonoscopy for further evaluation of the cause of this diarrhea on 01/06/2014. and she is finishing that on this coming weekend.   She was examined today and her labs today showed normal CMET exceopt her potassium is slightly low at 3.2. Her calcium has normalized with a value of 9.8 today. She was given Xgeva on 11/20/2013  MEDICAL HISTORY: Past Medical History  Diagnosis Date  . Central hypothyroidism   . Hyperlipidemia   . Spinal cord injury   . Depression   . Cancer     spinal metastasis  . PONV (postoperative nausea and vomiting)   . Pulmonary embolism 08/2013  . C. difficile diarrhea     INTERIM HISTORY: has Fracture of L3 vertebra; Fracture of L1 vertebra; Substance abuse; Protein-calorie malnutrition, severe; Breast cancer metastasized to multiple sites; Weight loss; Hypothyroidism (acquired); Underweight; C. difficile diarrhea; Hypotension; Hypokalemia; Recurrent Clostridium difficile diarrhea; and Cardiomyopathy on her problem list.    As stated above she complains of increased pain in her lower back and left leg. She is currently taking Percocet 5/325 2 tablets every 6 hours and Valium 10 mg every 6 hours. This does not completely manage her pain. She recently underwent a PET scan for reevaluation of her disease. She presents to discuss the results and potentially discuss further treatment options.   She was hypotensive in the clinic last visit. She is not able to walk or ambulate and wheel chair bound now.   ALLERGIES:  is allergic to morphine and related.  MEDICATIONS: has a current medication list which includes the following prescription(s): aspirin-acetaminophen-caffeine, carvedilol, diazepam, duloxetine, enoxaparin, gabapentin, lactose free nutrition, letrozole, levothyroxine, ondansetron, oxycodone-acetaminophen, probiotic product, sucralfate,  tizanidine, topiramate, multivitamin with minerals, omega-3 fatty acids, rizatriptan, triamcinolone 0.1 % cream : eucerin, and vancomycin.  SURGICAL HISTORY:  Past Surgical History  Procedure Laterality Date  . Laminectomy    . Abdominal hysterectomy      REVIEW OF SYSTEMS:   Constitutional: Denies fevers, chills or abnormal weight loss, still c/o pain Eyes: Denies blurriness of vision Ears, nose, mouth, throat, and face: Denies mucositis or sore throat Respiratory: Denies cough, dyspnea or wheezes Cardiovascular: Denies palpitation, chest discomfort or lower extremity swelling Gastrointestinal:  Denies nausea, heartburn or change in bowel habits. Diarrhea improved. Skin: Denies abnormal skin rashes Lymphatics: Denies new lymphadenopathy or easy bruising Neurological:Denies numbness, tingling or new weaknesses Behavioral/Psych: Mood is stable, no new changes  All other systems were reviewed with the patient and are negative.  PHYSICAL EXAMINATION: ECOG PERFORMANCE STATUS: 2-3  Blood pressure 97/71, pulse 98, temperature 97.6 F (36.4 C), temperature source Oral, resp. rate 19, weight 0 lb (0 kg), SpO2 97 %. Repeat BP post hydration was 100/65.  GENERAL:alert, no distress and comfortable; sitting in wheelchair comfortable.  SKIN: skin color, texture, turgor are normal, no rashes or significant lesions EYES: normal, Conjunctiva are pink and non-injected, sclera clear OROPHARYNX:no exudate, no erythema and lips, buccal mucosa, and tongue normal NECK: supple, thyroid normal size, non-tender, without nodularityLYMPH:  no palpable lymphadenopathy in the cervical,  Supraclavicular BREASTS: exam deferred today. It was 8x8 cm on 09/10/13 exam.LUNGS: clear  to auscultation with normal breathing effort, no wheezes or rhonchi HEART: regular rate & rhythm and no murmurs and no lower extremity edema ABDOMEN:abdomen soft, non-tender and normal bowel sounds Musculoskeletal:no cyanosis of digits and no  clubbing NEURO: alert & oriented x 3 with fluent speech, lower extremity decreased movement (R greater than L) to the hips.   Labs:  Lab Results  Component Value Date   WBC 5.5 01/01/2014   HGB 12.8 01/01/2014   HCT 40.1 01/01/2014   MCV 95.2 01/01/2014   PLT 324 01/01/2014   NEUTROABS 4.0 01/01/2014      Chemistry      Component Value Date/Time   NA 136 01/01/2014 1455   NA 141 11/12/2013 0502   K 3.2* 01/01/2014 1455   K 4.0 11/12/2013 0502   CL 107 11/12/2013 0502   CO2 25 01/01/2014 1455   CO2 24 11/12/2013 0502   BUN 9.2 01/01/2014 1455   BUN 3* 11/12/2013 0502   CREATININE 0.5* 01/01/2014 1455   CREATININE 0.37* 11/12/2013 0502      Component Value Date/Time   CALCIUM 9.8 01/01/2014 1455   CALCIUM 10.1 11/12/2013 0502   CALCIUM 10.0 08/28/2013 1000   ALKPHOS 179* 01/01/2014 1455   ALKPHOS 104 11/09/2013 1433   AST 22 01/01/2014 1455   AST 30 11/09/2013 1433   ALT 20 01/01/2014 1455   ALT 13 11/09/2013 1433   BILITOT <0.20 01/01/2014 1455   BILITOT <0.2* 11/09/2013 1433       Basic Metabolic Panel:  Recent Labs Lab 01/01/14 1455  NA 136  K 3.2*  CO2 25  GLUCOSE 158*  BUN 9.2  CREATININE 0.5*  CALCIUM 9.8   Liver Function Tests:  Recent Labs Lab 01/01/14 1455  AST 22  ALT 20  ALKPHOS 179*  BILITOT <0.20  PROT 6.6  ALBUMIN 3.1*   CBC:  Recent Labs Lab 01/01/14 1455  WBC 5.5  NEUTROABS 4.0  HGB 12.8  HCT 40.1  MCV 95.2  PLT 324    RADIOGRAPHIC STUDIES: I reviewed them all today. Nm Pet Image Restag (ps) Skull Base To Thigh  12/17/2013   CLINICAL DATA:  Initial treatment strategy for breast cancer.  EXAM: NUCLEAR MEDICINE PET SKULL BASE TO THIGH  TECHNIQUE: 6.1 mCi F-18 FDG was injected intravenously. Full-ring PET imaging was performed from the skull base to thigh after the radiotracer. CT data was obtained and used for attenuation correction and anatomic localization.  FASTING BLOOD GLUCOSE:  Value: 96 mg/dl  COMPARISON:  CT chest  abdomen pelvis 08/28/2013.  FINDINGS: NECK  No hypermetabolic lymph nodes in the neck. CT images show no acute findings.  CHEST  Retroareolar soft tissue in the right breast measures approximately 1.2 x 2.1 cm with an SUV max of 5.0. No hypermetabolic mediastinal, hilar or axillary lymph nodes. No hypermetabolic pulmonary nodules. CT images show partially calcified thyroid nodules, measuring up to 2.4 cm on the right. Tiny bilateral pleural effusions. Subsegmental atelectasis in both lower lobes.  ABDOMEN/PELVIS  There are new low-attenuation lesions in the liver, measuring up to 1.8 x 3.0 cm in the right hepatic lobe with an SUV max of 7.0. No abnormal hypermetabolism knee adrenal glands, spleen or pancreas. No hypermetabolic lymph nodes.  CT images show a 5.0 cm exophytic lesion off the inferior right hepatic lobe, unchanged and representative of a hemangioma on 08/28/2013. Gallbladder and adrenal glands are unremarkable. Stones are seen in the kidneys bilaterally. Spleen, pancreas, stomach and bowel are grossly unremarkable. No  free fluid.  SKELETON  Lytic lesions are seen throughout the visualized osseous structures. Index lesion in the anterior left first rib has an SUV max of 5.3. Lucency, irregularity and cortical thinning in the right acetabulum without a definite fracture. Cervical and lumbar spine better evaluated on dedicated imaging performed 08/29/2013.  IMPRESSION: 1. Hypermetabolic retroareolar right breast lesion with new hepatic metastatic disease. 2. Diffuse osseous metastatic disease. Changes at the right acetabulum likely put the patient at increased risk for a pathologic fracture. 3. Tiny bilateral pleural effusions. 4. Bilateral renal stones. 5. Cervical and lumbar spine better evaluated on dedicated imaging performed 08/29/2013.   Electronically Signed   By: Lorin Picket M.D.   On: 12/17/2013 14:33   Mr Card Morphology Wo/w Cm  12/25/2013   CLINICAL DATA:  Chemotherapy, echo was not  interpretable. Assess LV function.  EXAM: CARDIAC MRI  TECHNIQUE: The patient was scanned on a 1.5 Tesla GE magnet. A dedicated cardiac coil was used. Functional imaging was done using Fiesta sequences. 2,3, and 4 chamber views were done to assess for RWMA's. Modified Simpson's rule using a short axis stack was used to calculate an ejection fraction on a dedicated work Conservation officer, nature. The patient received 25 cc of Multihance. After 10 minutes inversion recovery sequences were used to assess for infiltration and scar tissue.  FINDINGS: Limited views of the lung fields showed no gross abnormalities. There was prominent epicardial adipose tissue.  Normal left ventricular size and wall thickness. Normal systolic function, EF 34% with no wall motion abnormalities. Normal right ventricular size and systolic function. There was mild aortic insufficiency and no aortic stenosis. There did not appear to be significant mitral regurgitation. The atria were normal in size.  There was no myocardial delayed enhancement.  MEASUREMENTS: MEASUREMENTS LV EDV 77 mL  LV SV 48 mL  LV EF 63%  IMPRESSION: 1.  Normal left ventricular size and systolic function, EF 19%.  2.  Normal right ventricular size and systolic function.  3.  Mild aortic insufficiency.  Dalton Mclean   Electronically Signed   By: Loralie Champagne M.D.   On: 12/25/2013 07:09    ECHO 10/28/2013  LV EF: 40% - 45%  ------------------------------------------------------------------- Indications: Neoplasm - breast 174.9.  ------------------------------------------------------------------- History: Risk factors: Current tobacco use. Dyslipidemia.  ------------------------------------------------------------------- Study Conclusions  - Left ventricle: The cavity size was normal. Systolic function was mildly to moderately reduced. The estimated ejection fraction was in the range of 40% to 45%. Wall motion was normal; there were no regional wall  motion abnormalities. Doppler parameters are consistent with abnormal left ventricular relaxation (grade 1 diastolic dysfunction). - Aortic valve: There was no regurgitation. - Aortic root: The aortic root was normal in size. - Mitral valve: There was no regurgitation. - Left atrium: The atrium was normal in size. - Right ventricle: Systolic function was mildly to moderately reduced. - Right atrium: The atrium was normal in size. - Tricuspid valve: There was no regurgitation. - Pulmonic valve: There was no regurgitation. - Pulmonary arteries: Systolic pressure was within the normal range. - Pericardium, extracardiac: There was no pericardial effusion.  Impressions:  - This is a very limited quality study. Left and right ventricular function appears to be at least mildly impaired. We are unable to comment on regional wall motion abnormalities. No significant valvular abnormality.An alternative imaging modality such as cardiac MRI is recommended.  PATHOLOGY: 09/02/2013  Bone, biopsy, Destructive rt iliac bone lesion - METASTATIC CARCINOMA.- SEE COMMENT.1 of  2 FINAL for Brinker, Siani ([id number])Microscopic CommentGiven the clinical findings, the features are consistent with metastatic grade II breast ductal carcinoma. Abprognostic profile will be performed and the results reported separately. (JBK:gt, 09/02/13)JOSHUA KISH MD Pathologist, Electronic Signature (Case signed 09/02/2013)  PROGNOSTIC INDICATORS - ACIS Results: IMMUNOHISTOCHEMICAL AND MORPHOMETRIC ANALYSIS BY THE AUTOMATED CELLULAR IMAGING SYSTEM (ACIS) Estrogen Receptor: 100%, POSITIVE, STRONG STAINING INTENSITY Progesterone Receptor: 0%, NEGATIVE Proliferation Marker Ki67: 53% HER2 NEU CISH shows amplification ratio 3.63, copy no 5.45    ASSESSMENT: Kristie Jackson 58 y.o. female with a history of 1. MBC ER+, HER2+ Extensive skeletal metastasis.Recent 2 hospitalizations for c diff colitis and diarrhea/dehydration. Lapatinib  could have exacerbated this diarrhea. The plan was to initiate Herceptin therapy however this has been on hold until her cardiac function could be completely evaluated.The recent cardiac MRI of her EF is 63%   PLAN:   1. Metastatic Breast Cancer  complicated by L3 compression and moderate to advanced spinal stenosis and Hypercalcemia today. -Mets to the spine, right acetabulum with cord compression, dura, axillary, supraclavicular LN, liver  --We will CONTINUE femara 2.5 mg daily. Her HER-2 was positive and she was started on Tykerb 1250 mg by mouth daily. After the 2 recent hospitalizations, Dr. Lona Kettle stopped the Tykerb and the plan was to start IV Herceptin but on hold till cardiac dysfunction resolves. As stated above by a cardiac MRI her EF is 63%.  --Palliative XRT per Radiation oncology completed.  --Consider addition of zometa or denosumab based on extensive skeletal metastases to help reduce risk of skeletal related events.  She will require a baseline dental evaluation. Dr. Lona Kettle emphasized to her that it is very important and she will make an appointment with her dentist.  --Dr. Lona Kettle was considering other good options like Perjeta and TDM-1 antibody.  His first preference would be Herceptin upon recovery of EF in normal range.  -- Xgeva started 11/20/2013 for hypercalecmia and bone metastasis after explaining risks vs benefits. In her case the benefits outweigh the risks which include but are not limited to risk of hypocalcemia, hypophosphatemia, risk for ONJ (Osteonecrosis of Jaw) and infusion reactions rarely.  2. R lower lobe segmental PE secondary to #1 and immobilization  --Lovenox 60 mg q 12 hours.   3. ECOG 3.   4. Hypothyroidism. --Continue Levothyroxine 47 mcg daily except Monday and Thursday.   5. Bladder/ Bowel incontinence secondary to #1.   6. Pain secondary to #1.  --Continue Percocet 1-2 tabs every 6 hours as need for pain.  Continue neurotin 1,2000 mg tid.    7. 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 --Secondary to #1.  Completed XRT to affected site.  Patient was on dexamethasone 4 mg every 6 hours which was tapered down.   8. History of central cord injury. After a horse fall in 1995.  9. Depression. --Continue cymbalta 60 mg daily.   10. Low Cardiac EF. --Cardiac MRI revealed normal range EF of 63%   11. Follow up.  --Patient was reviewed with Dr. Collene Mares. Patient was also discussed with Dr. Morey Hummingbird who will be taking over the patient's care. The patient left the office prior to discussing possible treatment options and possible changes to her pain management regimen. We will have her follow-up with Dr. Burr Medico in 1 week to establish care and to discuss treatment options as well as pain management. For the hypokalemia prescription for potassium chloride 20 mEq  per day times the next 7 Days was sent to her pharmacy of record via [facility].  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    All questions were answered. The patient knows to call the clinic with any problems, questions or concerns. We can certainly see the patient much sooner if necessary. She understands the goal of this therapy is palliative not curative and if she does not tolerate the treatment well or keeps getting re-hospitalized with complications and poor tolerance to systemic therapy, it would be very reasonable to initiate a discussion about hospice care. Most patients with her 2 positive disease stage 4 can survive for many years if they can tolerate a regimen and respond to it.    Wynetta Emery, Mikeila Burgen E, PA-C

## 2014-01-02 ENCOUNTER — Telehealth: Payer: Self-pay | Admitting: *Deleted

## 2014-01-02 ENCOUNTER — Encounter: Payer: Self-pay | Admitting: Physician Assistant

## 2014-01-02 MED ORDER — POTASSIUM CHLORIDE CRYS ER 20 MEQ PO TBCR
20.0000 meq | EXTENDED_RELEASE_TABLET | Freq: Once | ORAL | Status: AC
Start: 2014-01-02 — End: ?

## 2014-01-02 NOTE — Telephone Encounter (Signed)
Called and informed patient that a prescription for potassium has been sent to her pharmacy.  Per Awilda Metro, PA. Patient verbalized understanding.

## 2014-01-02 NOTE — Progress Notes (Signed)
Patient left her appointment on 01/01/2014 prior to final disposition for that day. I spoke with the patient's caregiver today as the patient was asleep, she is stated that the patient had already been at the hospital for 4 and half hours and just wanted to go home. Patient had been discussed with Dr. Burr Medico who will be taking over the patient's care. She will be seen by Dr. Burr Medico in one week to discuss treatment options and pain management.  Wynetta Emery, Ivan Maskell E, PA-C

## 2014-01-04 NOTE — Patient Instructions (Signed)
Follow-up in 1 week to establish care with Dr. Burr Medico and discuss treatment options as well as improved pain management

## 2014-01-05 ENCOUNTER — Telehealth: Payer: Self-pay | Admitting: Hematology

## 2014-01-05 NOTE — Telephone Encounter (Signed)
lvm for pt regarding to 11.27 appt.Marland KitchenMarland Kitchen

## 2014-01-07 ENCOUNTER — Telehealth: Payer: Self-pay | Admitting: Hematology

## 2014-01-07 NOTE — Telephone Encounter (Signed)
pt cld in to inq about appt-wanted to know waht time-gave pt time of appt-pt understood

## 2014-01-09 ENCOUNTER — Ambulatory Visit: Payer: Medicare Other | Admitting: Hematology

## 2014-01-09 ENCOUNTER — Telehealth: Payer: Self-pay | Admitting: Hematology

## 2014-01-09 ENCOUNTER — Telehealth: Payer: Self-pay | Admitting: *Deleted

## 2014-01-09 ENCOUNTER — Encounter: Payer: Medicare Other | Admitting: Hematology

## 2014-01-09 ENCOUNTER — Other Ambulatory Visit: Payer: Self-pay | Admitting: *Deleted

## 2014-01-09 NOTE — Telephone Encounter (Signed)
Patient FTKA today.  Called and r/s appt. For 01/26/14 per patient request. Called home number and spoke with Kristie Jackson who identified herself as the "caregiver".  Unable to speak with patient as "she is resting".  Inquired how patient is doing and amount of pain.  [name] states "patient is doing fine".  Also states "she is doing fine with pain."  No release of information on file.  Requested that when she comes to [facility] the next time, would she please sign a ROI so we would be able to speak with other people about her care, if patient is not available.

## 2014-01-09 NOTE — Progress Notes (Signed)
This encounter was created in error - please disregard.

## 2014-01-09 NOTE — Telephone Encounter (Signed)
R/s missed appt fro 01/09/14 to 01/26/14. Pt confirm.

## 2014-01-09 NOTE — Telephone Encounter (Signed)
Jessice (caregiver confirmed appt d/t for 01/26/14.

## 2014-01-12 ENCOUNTER — Telehealth: Payer: Self-pay | Admitting: Hematology

## 2014-01-12 NOTE — Telephone Encounter (Signed)
Called and confirmed all sch appt for Dec.

## 2014-01-13 ENCOUNTER — Telehealth: Payer: Self-pay | Admitting: Hematology

## 2014-01-13 ENCOUNTER — Telehealth: Payer: Self-pay | Admitting: *Deleted

## 2014-01-13 ENCOUNTER — Other Ambulatory Visit: Payer: Self-pay | Admitting: *Deleted

## 2014-01-13 DIAGNOSIS — C50919 Malignant neoplasm of unspecified site of unspecified female breast: Secondary | ICD-10-CM

## 2014-01-13 NOTE — Telephone Encounter (Signed)
PER 12/1 POF ADDED LAB AND INJ TO 12/14 F/U APPT. S/W PT SHE IS AWARE.

## 2014-01-13 NOTE — Telephone Encounter (Signed)
Spoke with pt again.  Instructed  pt to not come in for injection on 01/15/14.  Informed pt that a scheduler will contact pt with lab, office visit, and injection appts with Dr. Burr Medico on 01/26/14.  Pt voiced understanding.  Onc Tx sent to scheduler.

## 2014-01-13 NOTE — Telephone Encounter (Signed)
Pt called and inquired about appt for injection on 01/15/14.  Noted that pt was a no show for follow up appt with Dr. Burr Medico on 01/09/14.  Informed pt that Dr. Burr Medico will be notified, and pt will be contacted with further instructions.  Pt voiced understanding. Pt's  Phone    [phone].

## 2014-01-15 ENCOUNTER — Ambulatory Visit: Payer: Medicare Other

## 2014-01-21 ENCOUNTER — Ambulatory Visit: Payer: Medicare Other | Admitting: Hematology

## 2014-01-26 ENCOUNTER — Other Ambulatory Visit: Payer: Medicare Other

## 2014-01-26 ENCOUNTER — Ambulatory Visit: Payer: Medicare Other | Admitting: Hematology

## 2014-01-26 ENCOUNTER — Telehealth: Payer: Self-pay | Admitting: Hematology

## 2014-01-26 ENCOUNTER — Ambulatory Visit: Payer: Medicare Other

## 2014-01-26 NOTE — Telephone Encounter (Signed)
Pt r/s appt missed 01/27/15 to 02/24/14. PT confirmed. Mailed new cal.

## 2014-02-03 ENCOUNTER — Telehealth: Payer: Self-pay | Admitting: *Deleted

## 2014-02-03 NOTE — Telephone Encounter (Signed)
Received message from daughter Lesly Pontarelli requesting a call from nurse.  Spoke with [name] and was informed that pt is having seizures now.  [name] does not live with pt; she received a call from other family member stating pt has several seizures.  Instructed Megan to have pt taken to ER now and not wait.  Megan voiced understanding. Megan's phone   [phone].

## 2014-02-03 NOTE — Telephone Encounter (Signed)
Received message from daughter [name] again stating that pt does not have seizures now.  Spoke with [name] and was informed re:  Megan received call from pt's caregiver stating that pt does not have  seizures now but has  bad hallucinations.  [name] concerned that cancer might be  pressing optic nerves and will cause bad seizures.  [name] would like some medications prescribed for pt to help with hallucinations. Noted that pt was NO SHOW  On   11/27,  12/9,  12/14 .   Asked [name] about pt Not coming to her appts.  [name] stated pt is bedridden, and she did not know why pt did not show up.  Informed [name] that pt can be transported by non-emergency ambulance to her appts.  [name] stated she was aware of this, but did not know the reasons for pt not coming . Informed Megan that Dr. Burr Medico is not in office this week.  [name] needs to have pt taken to ER for further evaluation of severe hallucinations.  Megan voiced understanding and stated she would do so. Confirmed with [name] of pt's next scheduled appt with Dr. Burr Medico on 02/24/14.  [name] stated either she or her sister will be coming with pt to this appt.  Megan's  Phone    [phone].

## 2014-02-04 ENCOUNTER — Telehealth: Payer: Self-pay | Admitting: *Deleted

## 2014-02-04 ENCOUNTER — Telehealth: Payer: Self-pay | Admitting: Hematology

## 2014-02-04 ENCOUNTER — Other Ambulatory Visit: Payer: Self-pay | Admitting: *Deleted

## 2014-02-04 NOTE — Telephone Encounter (Signed)
Nurse noted pt did not go to ER yesterday for evaluation of hallucinations as instructed to daughter Kristie Jackson.  Dr. Lindi Adie notified of all information from 02/03/14.  Per Dr. Lindi Adie, pt can come in to see Jenny Reichmann, NP today for symptom management.   Called pt at home and spoke with caregiver, and was informed that pt was sleeping at present. Called daughter Kristie Jackson, and was informed that she and her sister had decided that pt will be going with hospice for comfort care.  Offered Kristie Jackson to have pt come in this am to see NP for symptom management.  Kristie Jackson declined.  Per Kristie Jackson, pt's primary Dr. Laurann Jackson will refer pt to Hospice. Kristie Jackson's  Phone    [phone].

## 2014-02-04 NOTE — Telephone Encounter (Signed)
Addendum [name] also requested appts in Jan 2016  To be cancelled.  Message to Dr. Burr Medico.

## 2014-02-04 NOTE — Telephone Encounter (Signed)
Per 12/23 POF pt's daughter [name] request all Jan apts to be cancelled.... KJ

## 2014-02-24 ENCOUNTER — Other Ambulatory Visit: Payer: Medicare Other

## 2014-02-24 ENCOUNTER — Ambulatory Visit: Payer: Medicare Other | Admitting: Hematology

## 2014-02-24 ENCOUNTER — Ambulatory Visit: Payer: Medicare Other

## 2014-02-26 ENCOUNTER — Telehealth: Payer: Self-pay | Admitting: *Deleted

## 2014-02-26 ENCOUNTER — Other Ambulatory Visit: Payer: Self-pay | Admitting: Physician Assistant

## 2014-02-26 NOTE — Telephone Encounter (Signed)
Noted pt expired at home  On  February 14, 2014.  Dr. Burr Medico aware.

## 2014-03-16 DEATH — deceased

## 2014-05-06 ENCOUNTER — Other Ambulatory Visit: Payer: Self-pay | Admitting: Nurse Practitioner

## 2015-12-23 IMAGING — CR DG CHEST 1V PORT
1 series · 1 of 1 positions shown · non-contrast
Comparison: None.

CLINICAL DATA: Fever.  Recent diagnosis of cancer

EXAM:
PORTABLE CHEST - 1 VIEW

[AP]
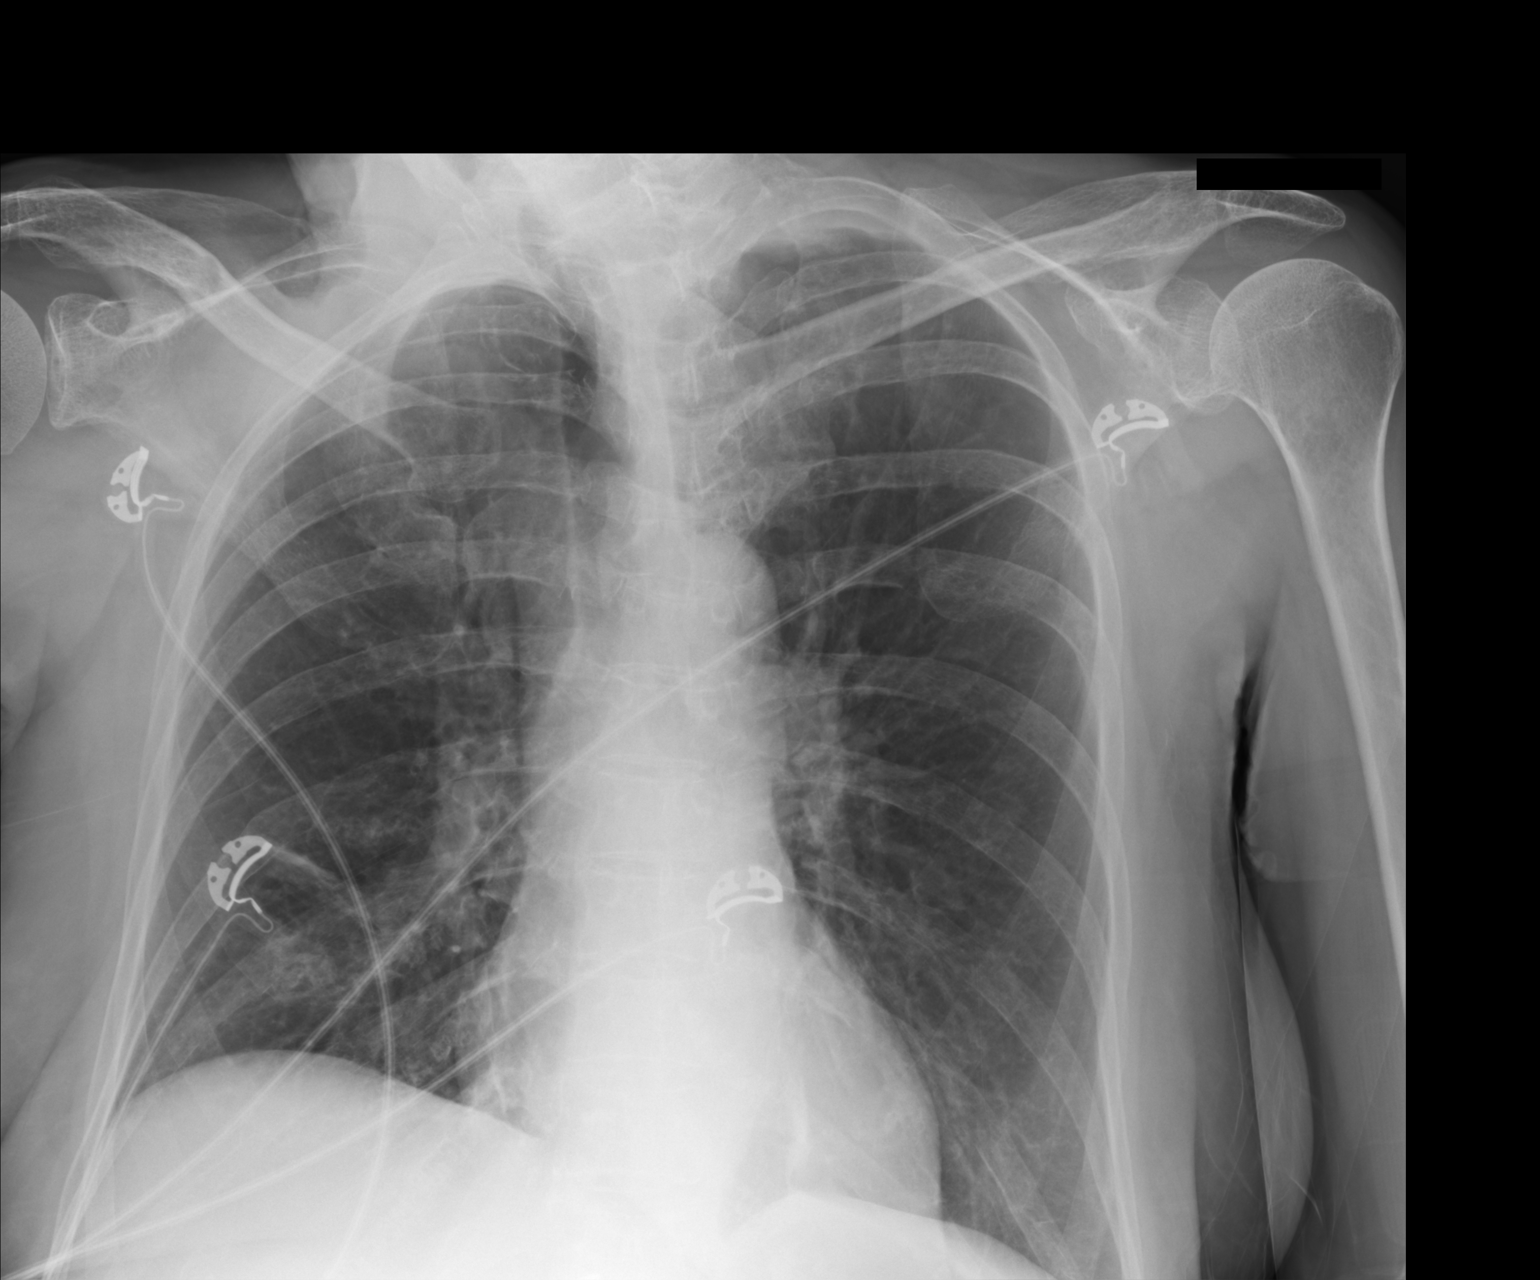

[1 of 1 positions shown; findings below may reference images not displayed]

FINDINGS: Small streaky opacities in the lower lungs. No definitive pneumonia.
No edema, effusion, or pneumothorax. There is pulmonary
hyperinflation suggesting COPD. Normal heart size and upper
mediastinal contours when accounting for distortion from rightward
rotation.
IMPRESSION: COPD and mild basilar atelectasis. No definitive pneumonia to
explain fever.

## 2015-12-24 IMAGING — CT CT HEAD W/O CM
3 of 4 series · 16 of 47 positions shown, 19 images · non-contrast
Comparison: None.

CLINICAL DATA: The encephalopathy.

EXAM:
CT HEAD WITHOUT CONTRAST
TECHNIQUE: Contiguous axial images were obtained from the base of the skull
through the vertex without intravenous contrast.

[Series 3: head 2.0 h70h · axial · 0.44mm/px · z∈[-220,-54]mm · 10 of 93 slices shown, 13 images]
[im 5/93  brain]
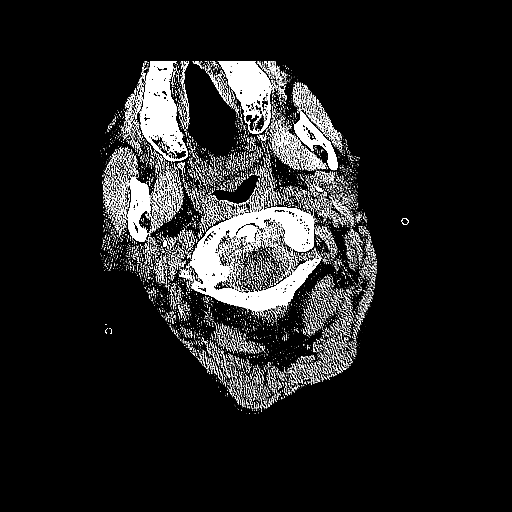
[im 5/93  bone]
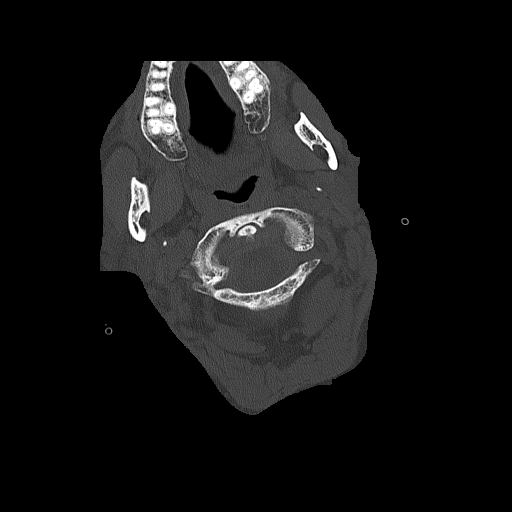
[im 14/93  brain]
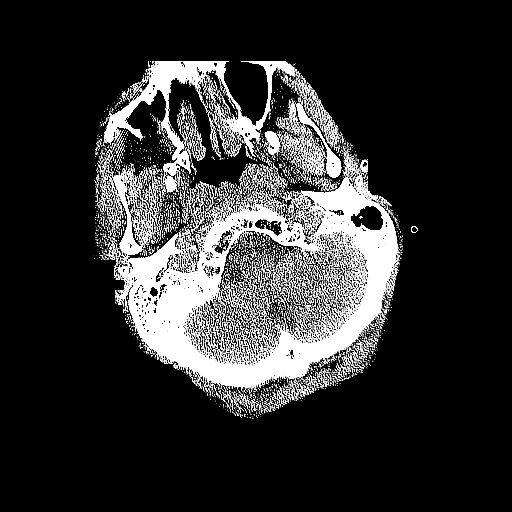
[im 24/93  brain]
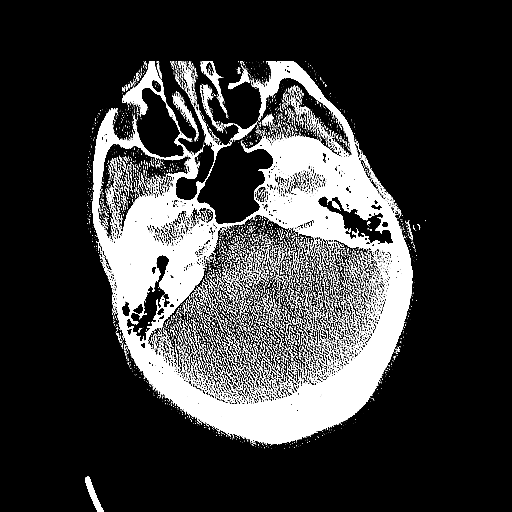
[im 33/93  brain]
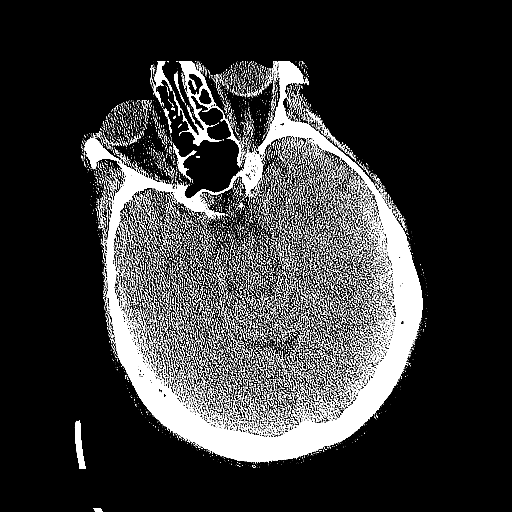
[im 42/93  brain]
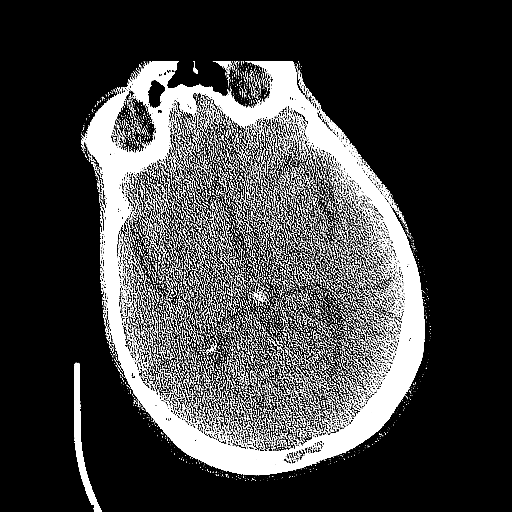
[im 42/93  bone]
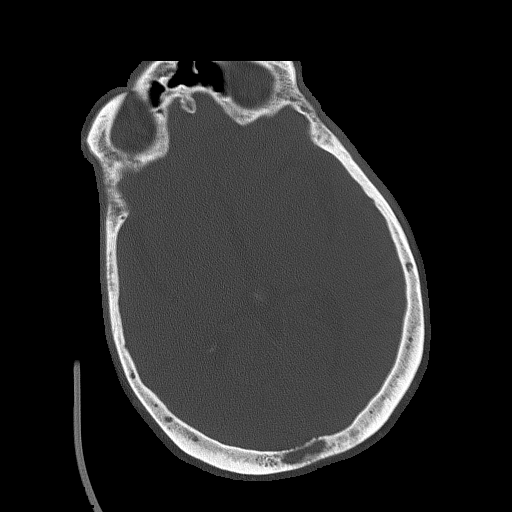
[im 51/93  brain]
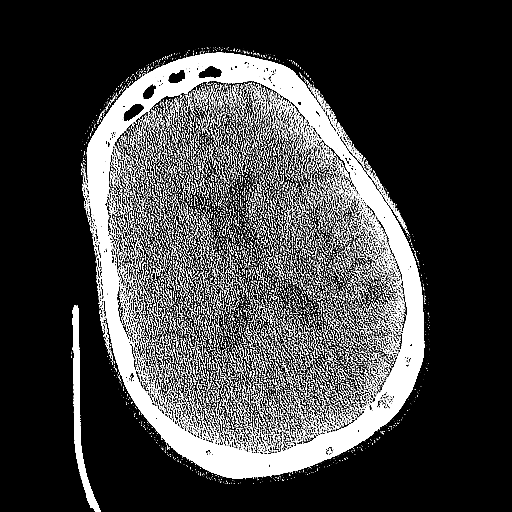
[im 60/93  brain]
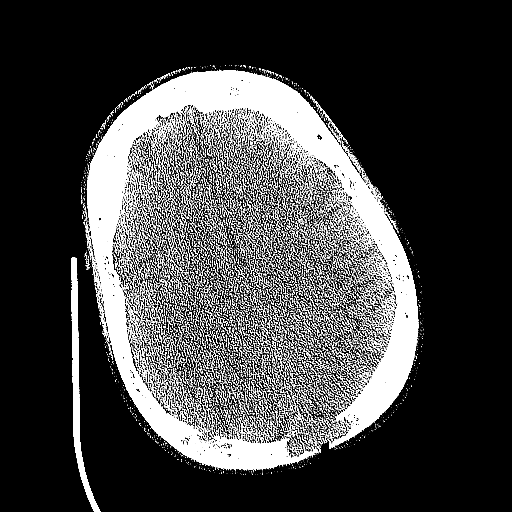
[im 70/93  brain]
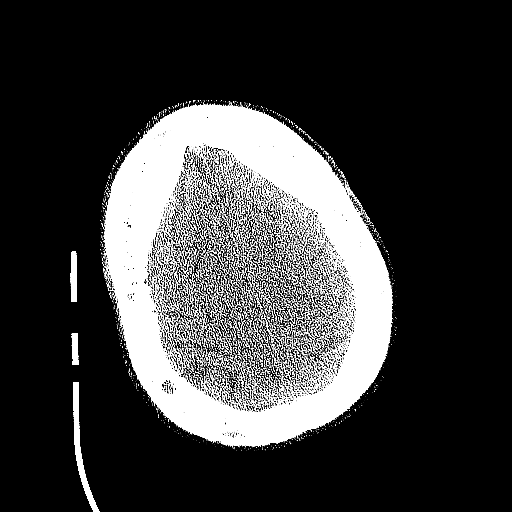
[im 79/93  brain]
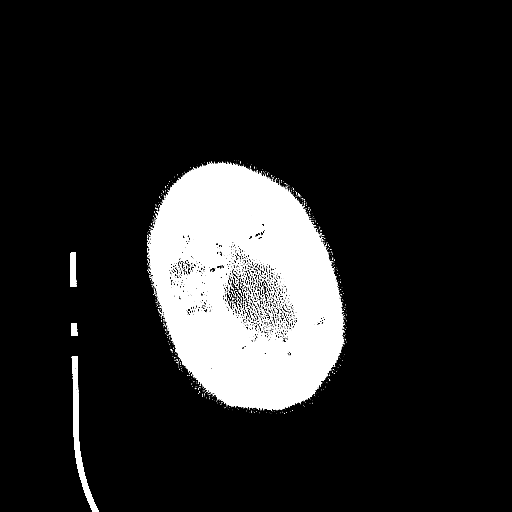
[im 79/93  bone]
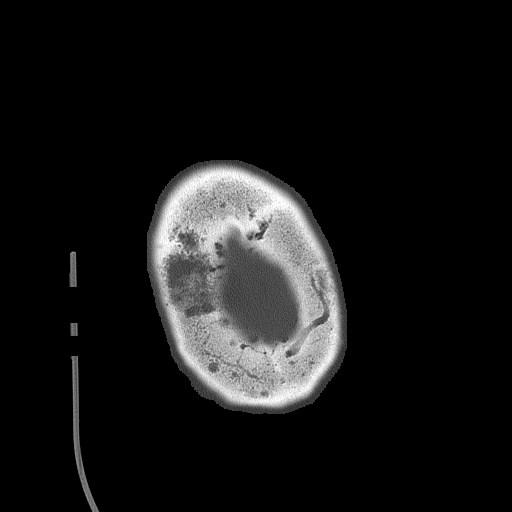
[im 88/93  brain]
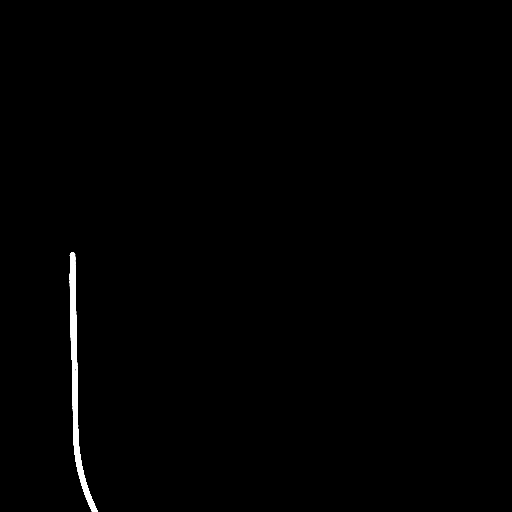

[Series 604: cor st · coronal · 0.49mm/px · 3 of 102 slices shown]
[im 34/102  brain]
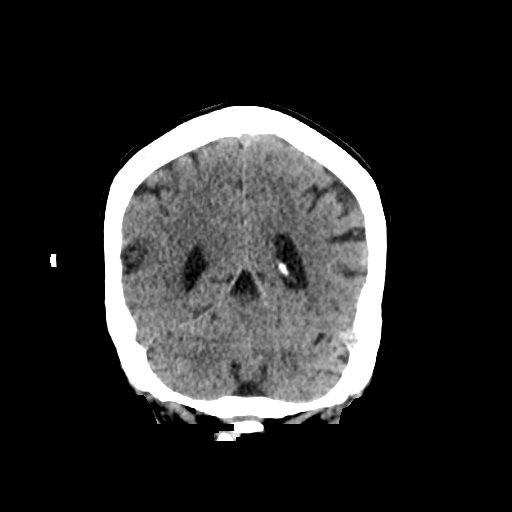
[im 45/102  brain]
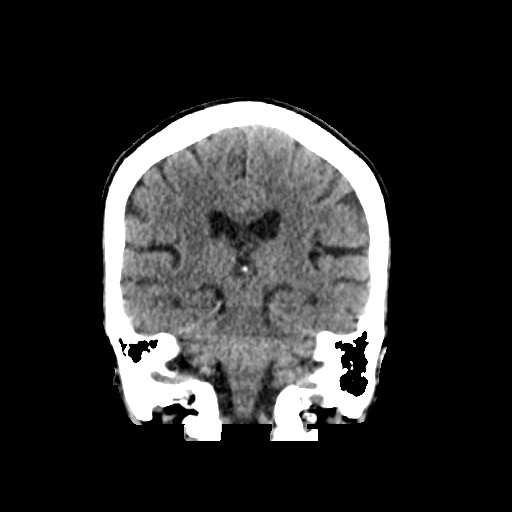
[im 57/102  brain]
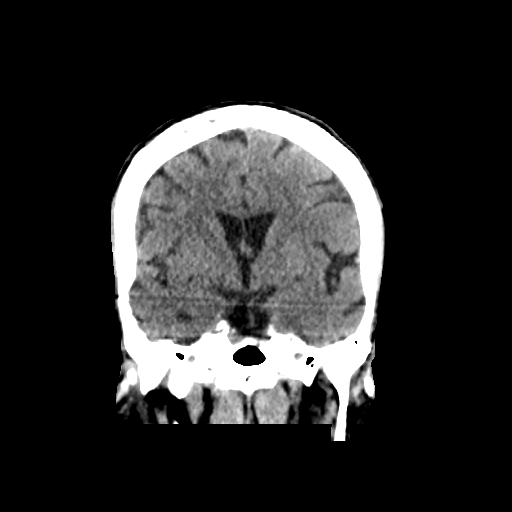

[Series 605: sag st · sagittal · 0.49mm/px · 3 of 76 slices shown]
[im 26/76  brain]
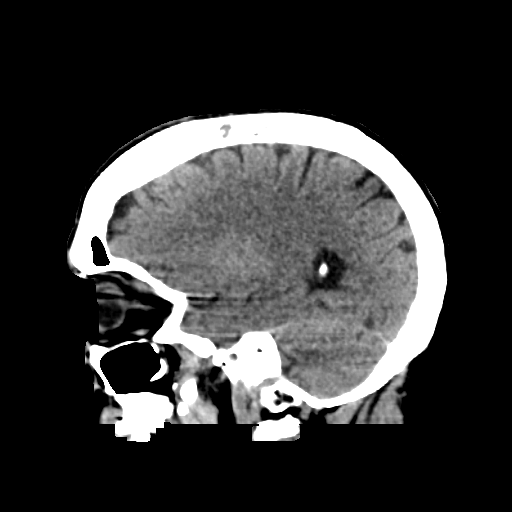
[im 38/76  brain]
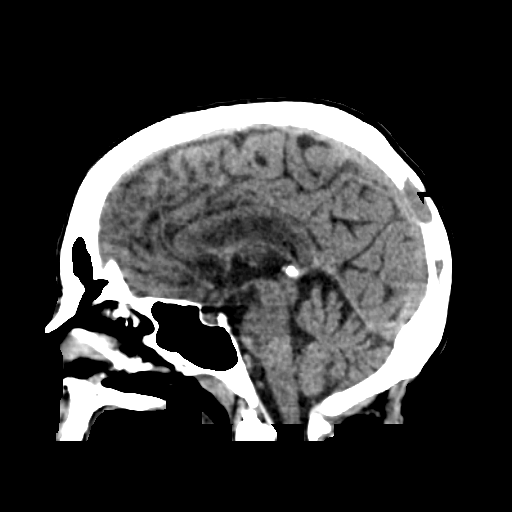
[im 51/76  brain]
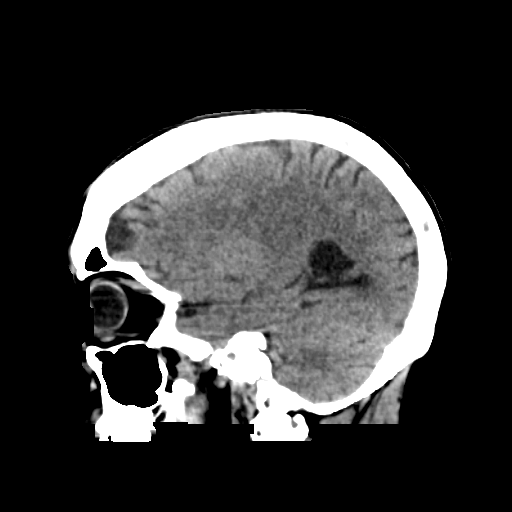

[16 of 47 positions shown; findings below may reference images not displayed]

FINDINGS: Skull and Sinuses:There are ill-defined lytic lesions throughout the
calvarium consistent with metastatic disease. The most notable
lesions are in the right parietal and central occipital-parietal
region measuring up to 3.5 cm in diameter. These lesions errode
through the inner table, including at the level of the superior
sagittal sinus.

Orbits: No acute abnormality.

Brain: No evidence of acute abnormality, such as acute infarction,
hemorrhage, hydrocephalus, or mass lesion/mass effect. Apparent
low-density in the medial right temporal lobe was evaluated on
coronal reformats. This is in a region of streak artifact, the most
likely cause. There is no mass effect in this region. No evidence of
low-density along the cingulate gyrus or insula.
IMPRESSION: 1. No acute intracranial findings.
2. Multi focal calvarial metastatic disease. There is multi focal
erosion through the inner table, including over the superior
sagittal sinus. Suggest enhanced MRI followup.

## 2015-12-28 IMAGING — CT CT BIOPSY
1 of 2 series · 15 of 32 positions shown, 19 images · non-contrast
Comparison: none

CLINICAL DATA: Extensive osseous metastatic disease, large right
iliac/ acetabular destructive bone mass

[Series 2: i-spiral 5.0 b40f · axial · 0.96mm/px · z∈[+822,+979]mm · 15 of 51 slices shown, 19 images]
[im 3/51  soft-tissue]
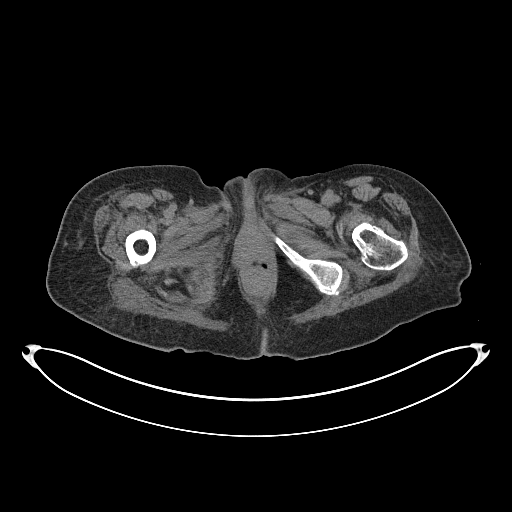
[im 3/51  bone]
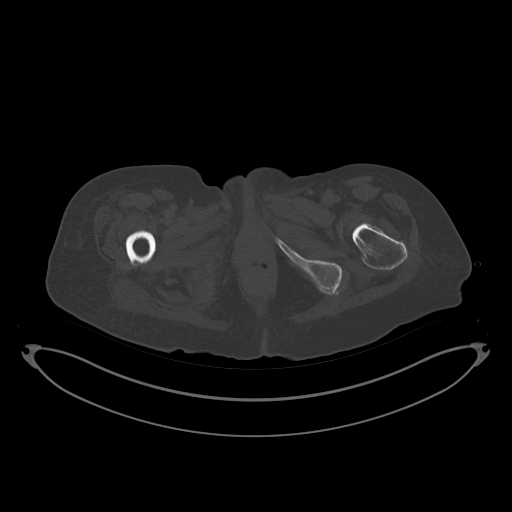
[im 7/51  soft-tissue]
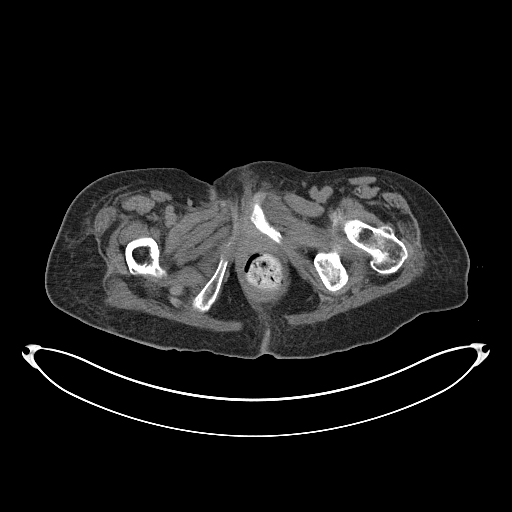
[im 11/51  soft-tissue]
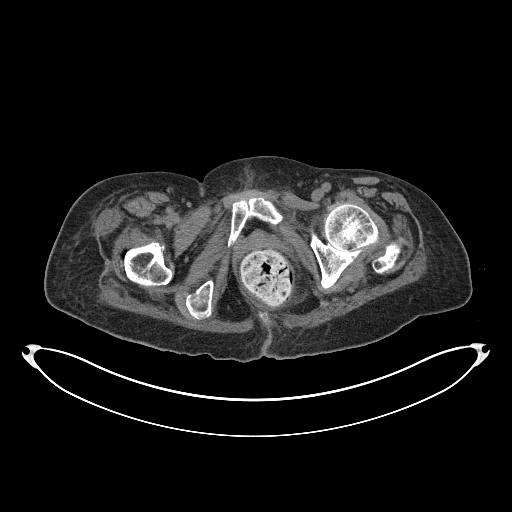
[im 14/51  soft-tissue]
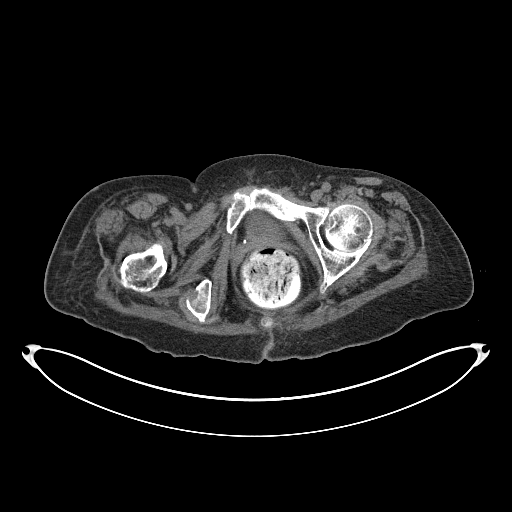
[im 18/51  soft-tissue]
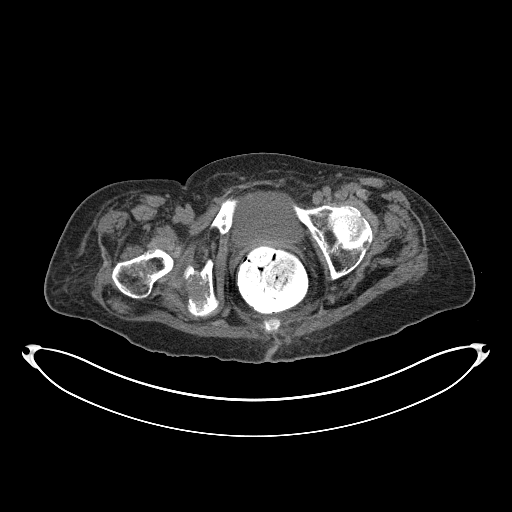
[im 22/51  soft-tissue]
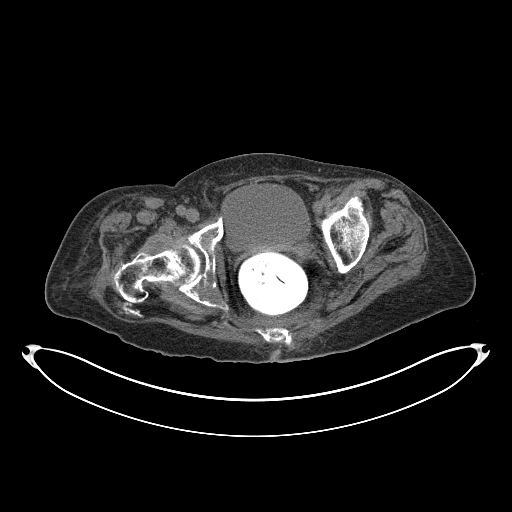
[im 27/51  soft-tissue]
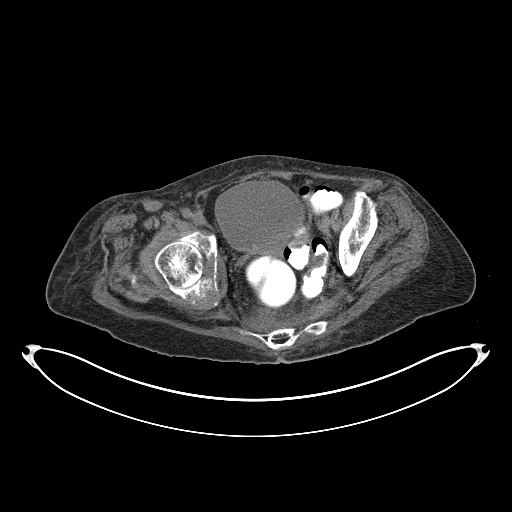
[im 29/51  soft-tissue]
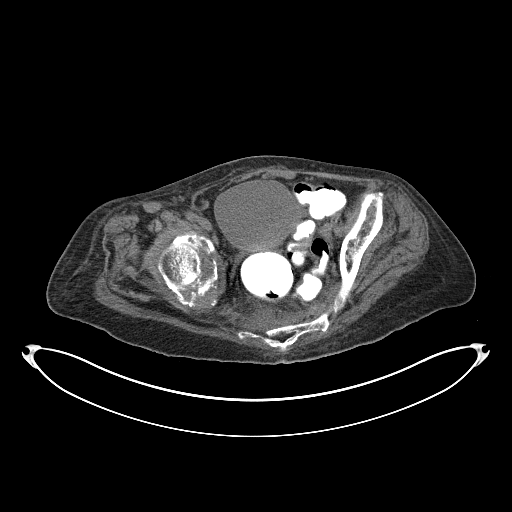
[im 33/51  soft-tissue]
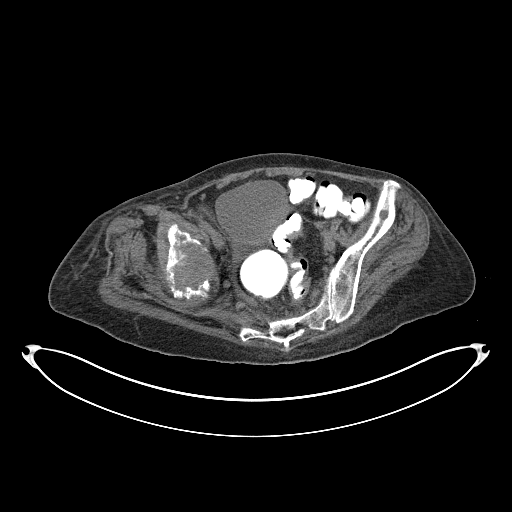
[im 33/51  bone]
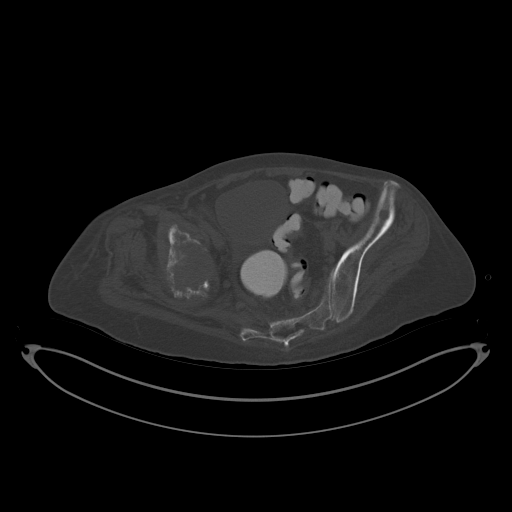
[im 37/51  soft-tissue]
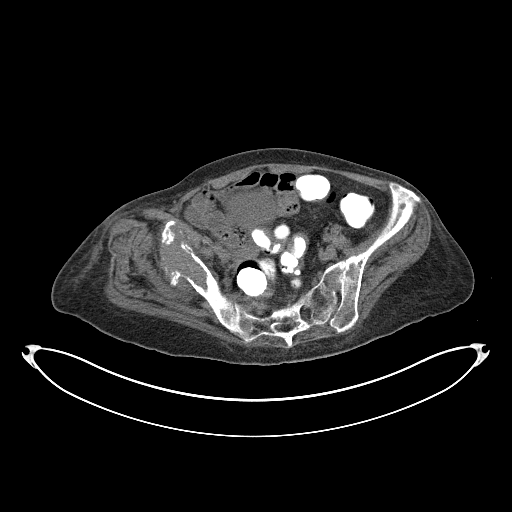
[im 40/51  soft-tissue]
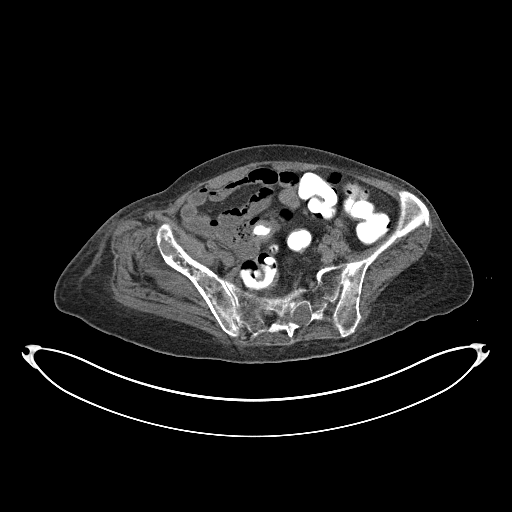
[im 42/51  lung]
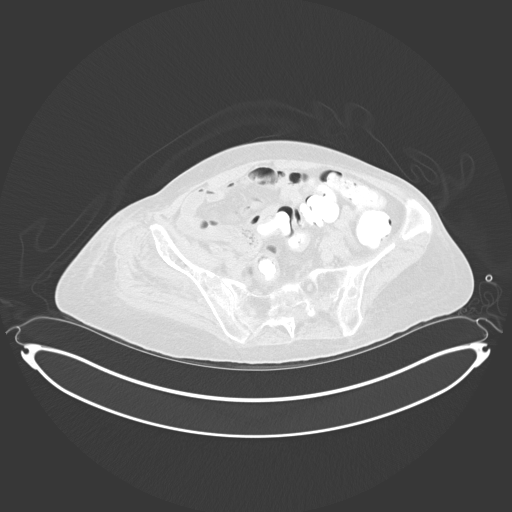
[im 44/51  soft-tissue]
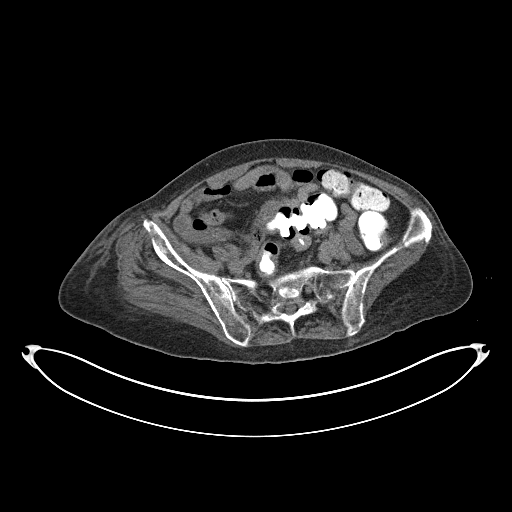
[im 44/51  lung]
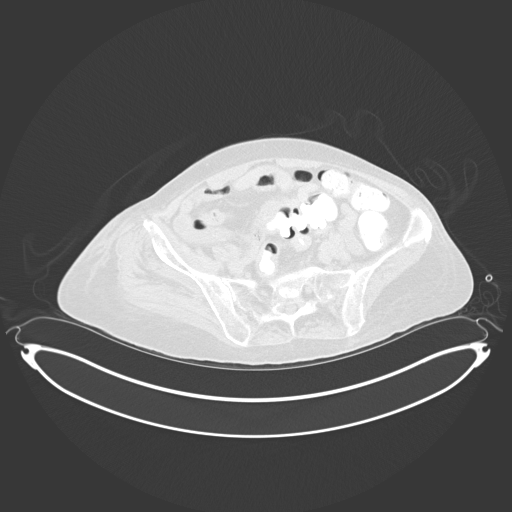
[im 46/51  lung]
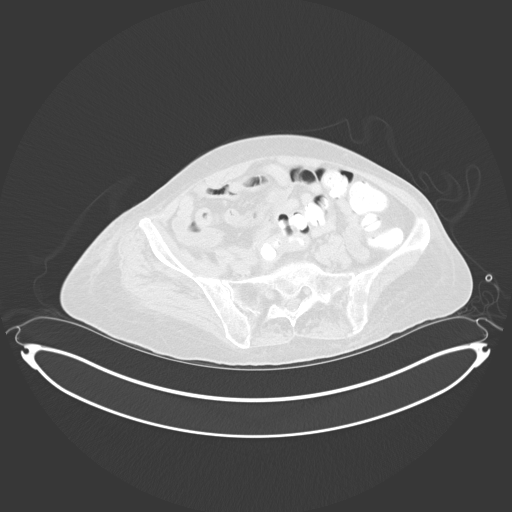
[im 48/51  soft-tissue]
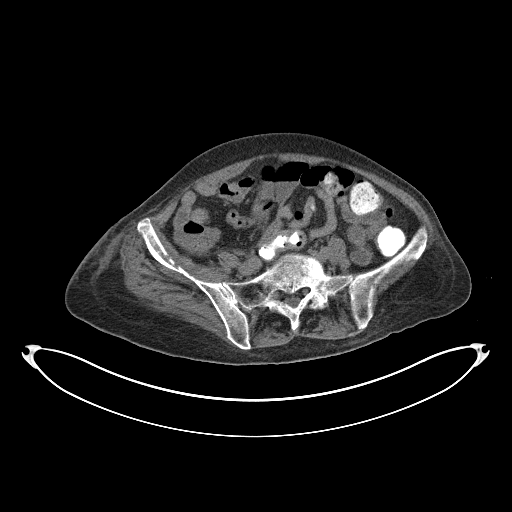
[im 48/51  lung]
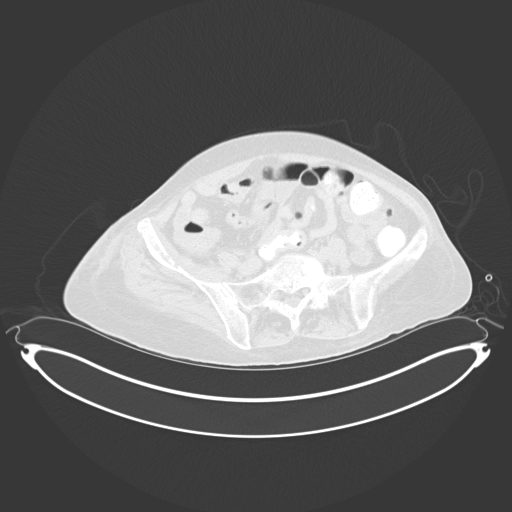

[15 of 32 positions shown; findings below may reference images not displayed]

EXAM:
CT GUIDED CORE BIOPSY OF RIGHT ILIAC DESTRUCTIVE BONE MASS

ANESTHESIA/SEDATION:
Two  Mg IV Versed; 100 mcg IV Fentanyl

Total Moderate Sedation Time: 15 minutes.

PROCEDURE:
The procedure risks, benefits, and alternatives were explained to
the patient. Questions regarding the procedure were encouraged and
answered. The patient understands and consents to the procedure.

The right hip region was prepped with Betadinein a sterile fashion,
and a sterile drape was applied covering the operative field. A
sterile gown and sterile gloves were used for the procedure. Local
anesthesia was provided with 1% Lidocaine.

Previous imaging reviewed. Patient positioned supine. Noncontrast
localization CT performed. The large right iliac destructive bone
mass was localized. Under sterile conditions and local anesthesia, a
17 gauge 6.8 cm access needle was advanced percutaneously from a
right anterior oblique approach. Needle position confirmed within
the bone mass. 18 gauge core biopsies obtained. Samples placed in
formalin. No immediate complication. Patient tolerated the biopsy
well.

Complications: No immediate
FINDINGS: Imaging confirms needle placed in in the right iliac destructive
bone mass.
IMPRESSION: Successful right iliac destructive bone mass 18 gauge core biopsy.
